# Patient Record
Sex: Female | Born: 1995 | Race: White | Hispanic: No | Marital: Single | Smoking: Former smoker
Health system: Southern US, Community
[De-identification: ages and names within clinical notes are randomized; demographics above are authoritative.]

## PROBLEM LIST (undated history)

## (undated) ENCOUNTER — Inpatient Hospital Stay: Payer: Self-pay

## (undated) ENCOUNTER — Emergency Department: Discharge status: Absent without leave | Payer: Managed Care, Other (non HMO)

## (undated) ENCOUNTER — Ambulatory Visit: Admission: EM | Payer: Medicaid Other | Source: Home / Self Care

## (undated) ENCOUNTER — Inpatient Hospital Stay: Payer: Managed Care, Other (non HMO)

## (undated) DIAGNOSIS — N390 Urinary tract infection, site not specified: Secondary | ICD-10-CM

## (undated) DIAGNOSIS — F329 Major depressive disorder, single episode, unspecified: Secondary | ICD-10-CM

## (undated) DIAGNOSIS — N12 Tubulo-interstitial nephritis, not specified as acute or chronic: Secondary | ICD-10-CM

## (undated) DIAGNOSIS — A419 Sepsis, unspecified organism: Secondary | ICD-10-CM

## (undated) DIAGNOSIS — Z9289 Personal history of other medical treatment: Secondary | ICD-10-CM

## (undated) DIAGNOSIS — F32A Depression, unspecified: Secondary | ICD-10-CM

## (undated) DIAGNOSIS — N809 Endometriosis, unspecified: Secondary | ICD-10-CM

## (undated) DIAGNOSIS — D649 Anemia, unspecified: Secondary | ICD-10-CM

## (undated) HISTORY — PX: TONSILLECTOMY: SUR1361

## (undated) HISTORY — PX: ABDOMINAL SURGERY: SHX537

---
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
## 2014-01-25 ENCOUNTER — Inpatient Hospital Stay: Payer: Self-pay

## 2014-01-25 LAB — URINALYSIS, COMPLETE
BILIRUBIN, UR: NEGATIVE
Ketone: NEGATIVE
NITRITE: NEGATIVE
Ph: 7 (ref 4.5–8.0)
Protein: 30
RBC,UR: 7 /HPF (ref 0–5)
SPECIFIC GRAVITY: 1.01 (ref 1.003–1.030)
WBC UR: 521 /HPF (ref 0–5)

## 2014-01-26 LAB — CBC WITH DIFFERENTIAL/PLATELET
BASOS PCT: 0.2 %
Basophil #: 0 10*3/uL (ref 0.0–0.1)
EOS ABS: 0.1 10*3/uL (ref 0.0–0.7)
Eosinophil %: 0.4 %
HCT: 32.2 % — AB (ref 35.0–47.0)
HGB: 11 g/dL — AB (ref 12.0–16.0)
Lymphocyte #: 1.6 10*3/uL (ref 1.0–3.6)
Lymphocyte %: 8.7 %
MCH: 29.5 pg (ref 26.0–34.0)
MCHC: 34.3 g/dL (ref 32.0–36.0)
MCV: 86 fL (ref 80–100)
MONOS PCT: 6.6 %
Monocyte #: 1.2 x10 3/mm — ABNORMAL HIGH (ref 0.2–0.9)
NEUTROS PCT: 84.1 %
Neutrophil #: 15.9 10*3/uL — ABNORMAL HIGH (ref 1.4–6.5)
PLATELETS: 246 10*3/uL (ref 150–440)
RBC: 3.74 10*6/uL — AB (ref 3.80–5.20)
RDW: 13.3 % (ref 11.5–14.5)
WBC: 18.9 10*3/uL — ABNORMAL HIGH (ref 3.6–11.0)

## 2014-01-26 LAB — BASIC METABOLIC PANEL
Anion Gap: 8 (ref 7–16)
BUN: 4 mg/dL — ABNORMAL LOW (ref 9–21)
CALCIUM: 8.7 mg/dL — AB (ref 9.0–10.7)
CHLORIDE: 107 mmol/L (ref 97–107)
CO2: 21 mmol/L (ref 16–25)
Creatinine: 0.63 mg/dL (ref 0.60–1.30)
EGFR (African American): >60
EGFR (Non-African Amer.): >60
Glucose: 108 mg/dL — ABNORMAL HIGH (ref 65–99)
OSMOLALITY: 269 (ref 275–301)
Potassium: 3.2 mmol/L — ABNORMAL LOW (ref 3.3–4.7)
Sodium: 136 mmol/L (ref 132–141)

## 2014-01-26 LAB — MAGNESIUM: Magnesium: 1.7 mg/dL — ABNORMAL LOW

## 2014-01-27 LAB — CBC WITH DIFFERENTIAL/PLATELET
Basophil #: 0 10*3/uL (ref 0.0–0.1)
Basophil %: 0.3 %
EOS PCT: 0.8 %
Eosinophil #: 0.1 10*3/uL (ref 0.0–0.7)
HCT: 30.4 % — AB (ref 35.0–47.0)
HGB: 10.2 g/dL — AB (ref 12.0–16.0)
LYMPHS PCT: 8.6 %
Lymphocyte #: 1.2 10*3/uL (ref 1.0–3.6)
MCH: 29.1 pg (ref 26.0–34.0)
MCHC: 33.4 g/dL (ref 32.0–36.0)
MCV: 87 fL (ref 80–100)
Monocyte #: 0.9 x10 3/mm (ref 0.2–0.9)
Monocyte %: 6.7 %
NEUTROS ABS: 11.4 10*3/uL — AB (ref 1.4–6.5)
Neutrophil %: 83.6 %
PLATELETS: 201 10*3/uL (ref 150–440)
RBC: 3.49 10*6/uL — AB (ref 3.80–5.20)
RDW: 13.3 % (ref 11.5–14.5)
WBC: 13.6 10*3/uL — ABNORMAL HIGH (ref 3.6–11.0)

## 2014-01-27 LAB — BASIC METABOLIC PANEL
Anion Gap: 9 (ref 7–16)
BUN: 3 mg/dL — ABNORMAL LOW (ref 9–21)
CHLORIDE: 104 mmol/L (ref 97–107)
Calcium, Total: 8.4 mg/dL — ABNORMAL LOW (ref 9.0–10.7)
Co2: 21 mmol/L (ref 16–25)
Creatinine: 0.61 mg/dL (ref 0.60–1.30)
EGFR (Non-African Amer.): >60
Glucose: 93 mg/dL (ref 65–99)
Osmolality: 264 (ref 275–301)
POTASSIUM: 2.9 mmol/L — AB (ref 3.3–4.7)
Sodium: 134 mmol/L (ref 132–141)

## 2014-01-27 LAB — URINE CULTURE

## 2014-01-27 LAB — MAGNESIUM: Magnesium: 1.5 mg/dL — ABNORMAL LOW

## 2014-01-28 LAB — BASIC METABOLIC PANEL
ANION GAP: 8 (ref 7–16)
BUN: 2 mg/dL — ABNORMAL LOW (ref 9–21)
CO2: 22 mmol/L (ref 16–25)
Calcium, Total: 8.7 mg/dL — ABNORMAL LOW (ref 9.0–10.7)
Chloride: 107 mmol/L (ref 97–107)
Creatinine: 0.57 mg/dL — ABNORMAL LOW (ref 0.60–1.30)
EGFR (African American): >60
EGFR (Non-African Amer.): >60
GLUCOSE: 84 mg/dL (ref 65–99)
Osmolality: 269 (ref 275–301)
Potassium: 3.4 mmol/L (ref 3.3–4.7)
Sodium: 137 mmol/L (ref 132–141)

## 2014-01-28 LAB — MAGNESIUM: Magnesium: 1.6 mg/dL — ABNORMAL LOW

## 2014-02-20 ENCOUNTER — Ambulatory Visit: Payer: Self-pay

## 2014-03-03 ENCOUNTER — Observation Stay: Payer: Self-pay | Admitting: Obstetrics and Gynecology

## 2014-03-11 ENCOUNTER — Observation Stay: Payer: Self-pay

## 2014-03-13 ENCOUNTER — Inpatient Hospital Stay: Payer: Self-pay | Admitting: Obstetrics and Gynecology

## 2014-03-13 LAB — CBC WITH DIFFERENTIAL/PLATELET
Basophil #: 0 10*3/uL (ref 0.0–0.1)
Basophil %: 0.2 %
EOS ABS: 0.1 10*3/uL (ref 0.0–0.7)
EOS PCT: 0.4 %
HCT: 33.3 % — ABNORMAL LOW (ref 35.0–47.0)
HGB: 11.2 g/dL — ABNORMAL LOW (ref 12.0–16.0)
LYMPHS ABS: 2.3 10*3/uL (ref 1.0–3.6)
Lymphocyte %: 13.3 %
MCH: 28.1 pg (ref 26.0–34.0)
MCHC: 33.5 g/dL (ref 32.0–36.0)
MCV: 84 fL (ref 80–100)
MONOS PCT: 5 %
Monocyte #: 0.9 x10 3/mm (ref 0.2–0.9)
NEUTROS PCT: 81.1 %
Neutrophil #: 13.8 10*3/uL — ABNORMAL HIGH (ref 1.4–6.5)
PLATELETS: 387 10*3/uL (ref 150–440)
RBC: 3.96 10*6/uL (ref 3.80–5.20)
RDW: 14.5 % (ref 11.5–14.5)
WBC: 17 10*3/uL — ABNORMAL HIGH (ref 3.6–11.0)

## 2014-03-13 LAB — GC/CHLAMYDIA PROBE AMP

## 2014-03-15 LAB — CBC
HCT: 27.1 % — ABNORMAL LOW (ref 35.0–47.0)
HGB: 9.2 g/dL — ABNORMAL LOW (ref 12.0–16.0)
MCH: 28.6 pg (ref 26.0–34.0)
MCHC: 33.8 g/dL (ref 32.0–36.0)
MCV: 85 fL (ref 80–100)
Platelet: 290 10*3/uL (ref 150–440)
RBC: 3.21 10*6/uL — AB (ref 3.80–5.20)
RDW: 14.3 % (ref 11.5–14.5)
WBC: 15.5 10*3/uL — AB (ref 3.6–11.0)

## 2014-03-16 LAB — HEMATOCRIT: HCT: 25.5 % — AB (ref 35.0–47.0)

## 2014-10-28 ENCOUNTER — Emergency Department: Payer: Self-pay | Admitting: Emergency Medicine

## 2015-01-26 IMAGING — US US RENAL KIDNEY
1 series · 14 of 25 positions shown · non-contrast
Comparison: 10/10/2013

CLINICAL DATA: Fever and abdominal pain. Pyelonephritis. The
patient is 33 weeks pregnant.

EXAM:
RENAL/URINARY TRACT ULTRASOUND COMPLETE

[Series 1: us renal kidney · 0.30mm/px · 14 of 55 slices shown]
[im 1/55]
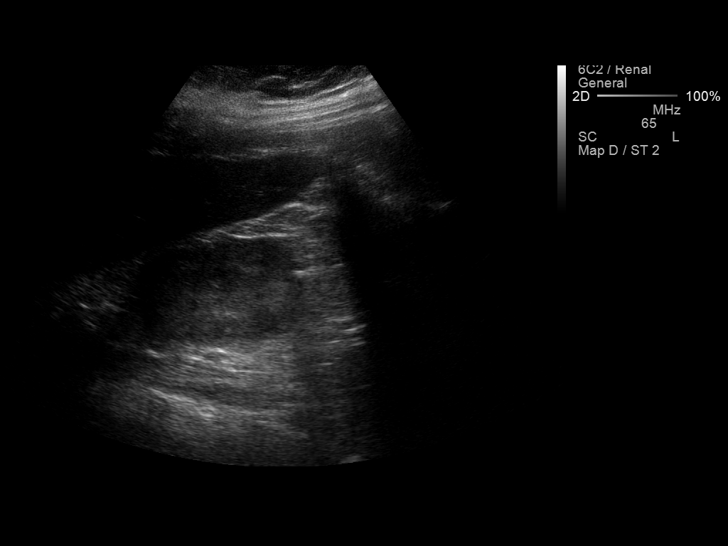
[im 5/55]
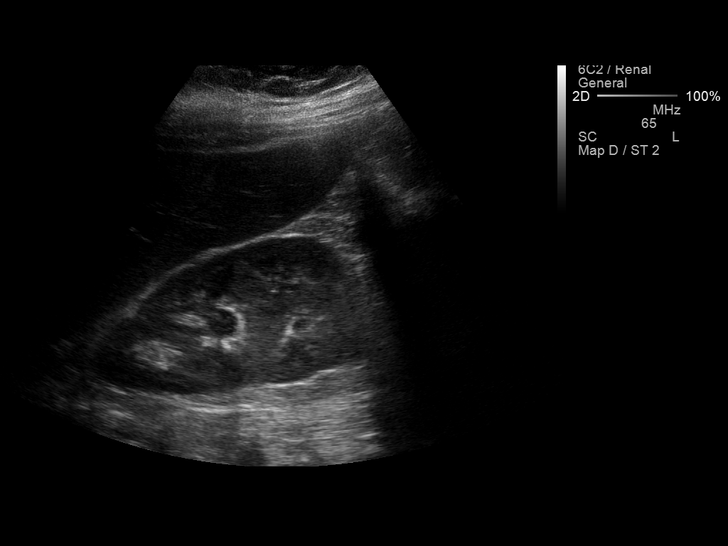
[im 10/55]
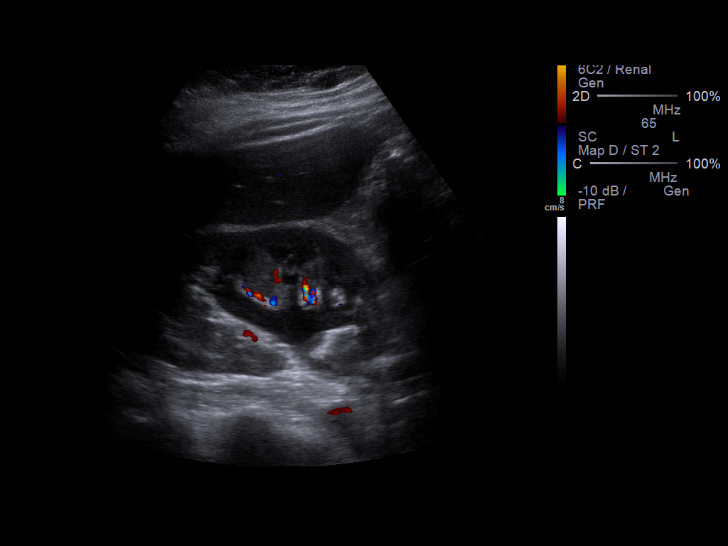
[im 14/55]
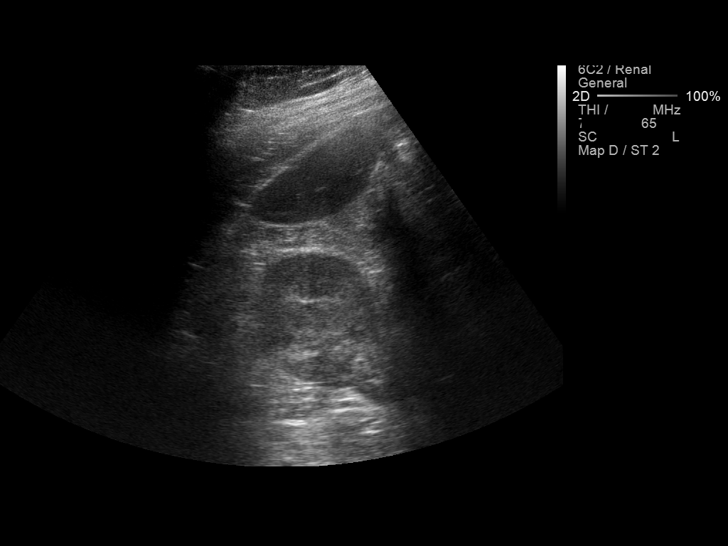
[im 19/55]
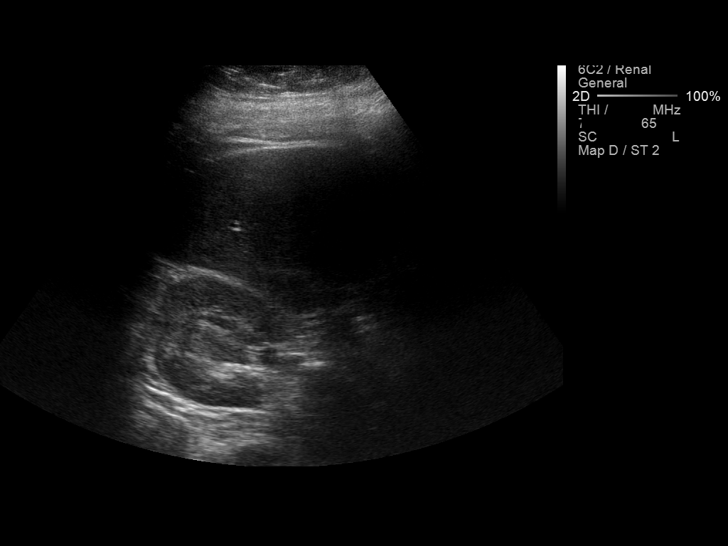
[im 21/55]
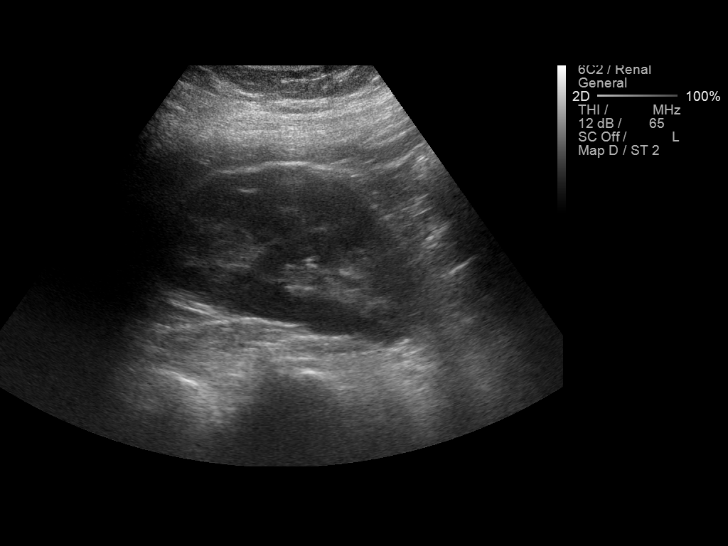
[im 25/55]
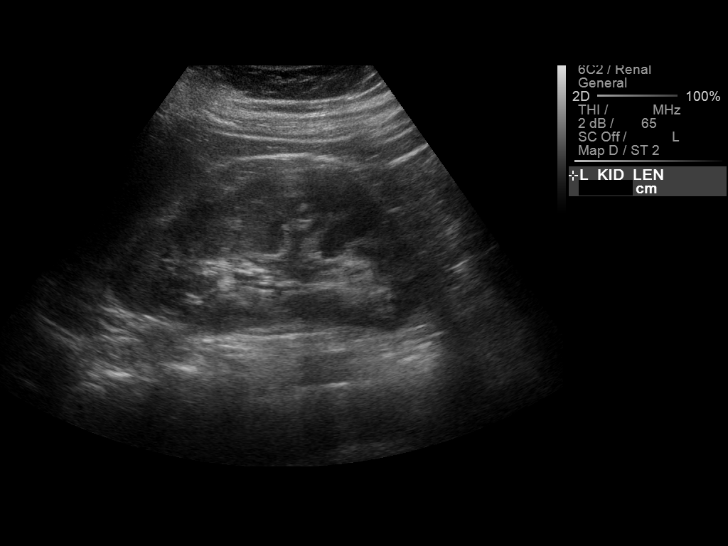
[im 30/55]
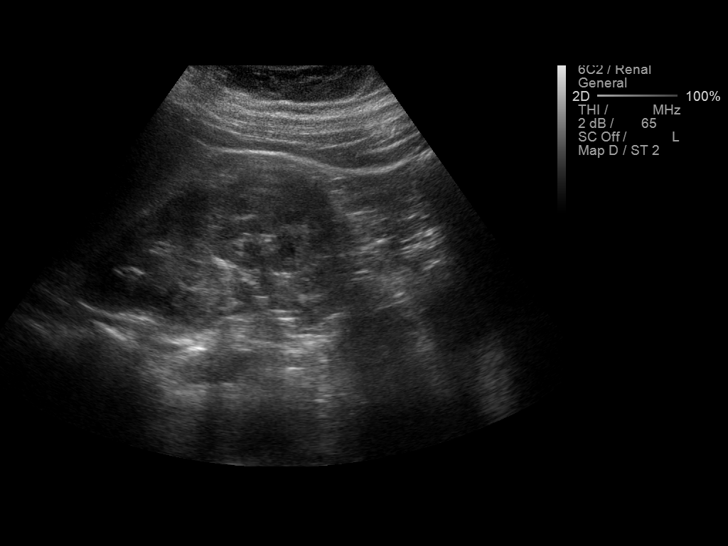
[im 34/55]
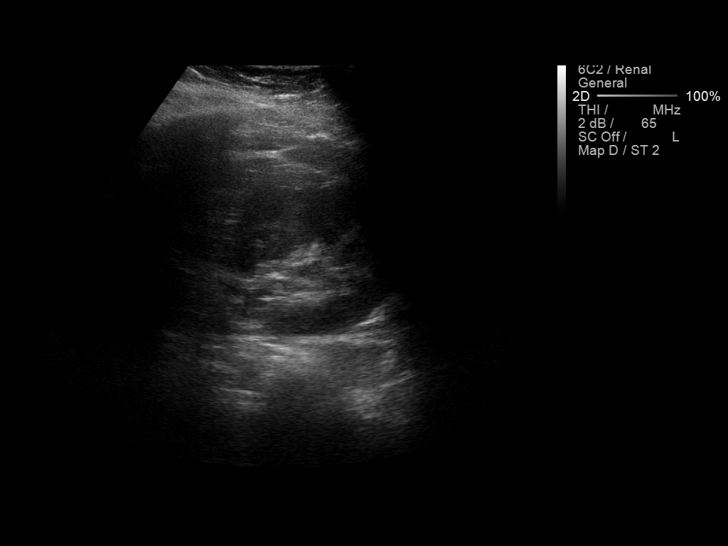
[im 37/55]
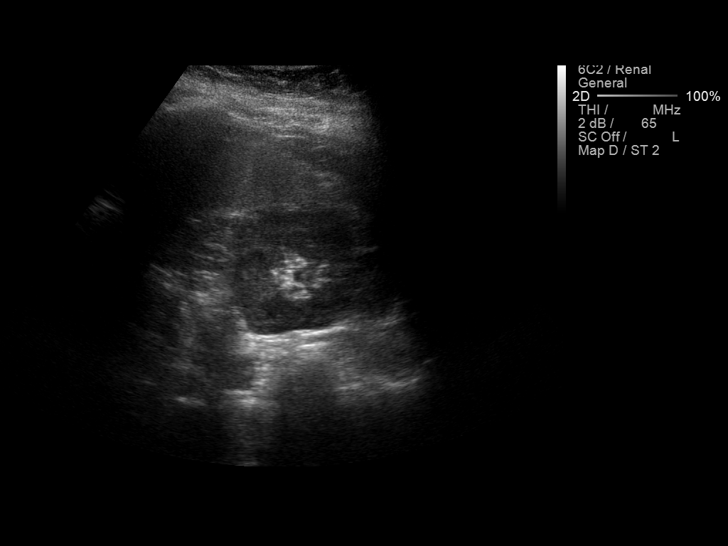
[im 41/55]
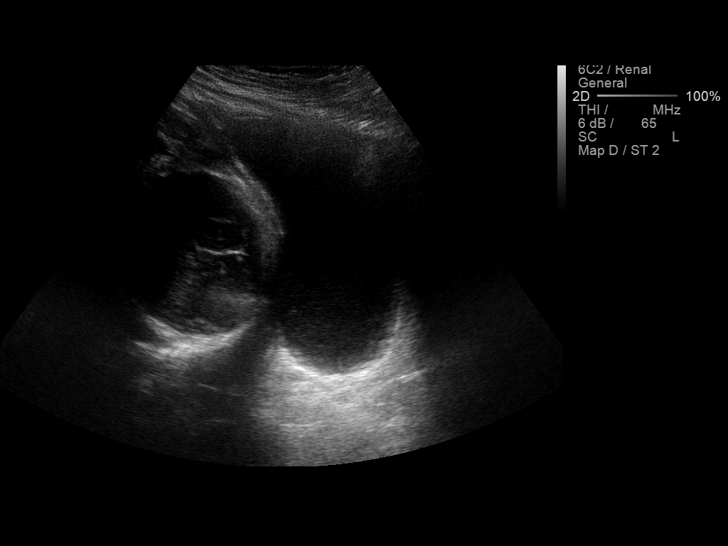
[im 46/55]
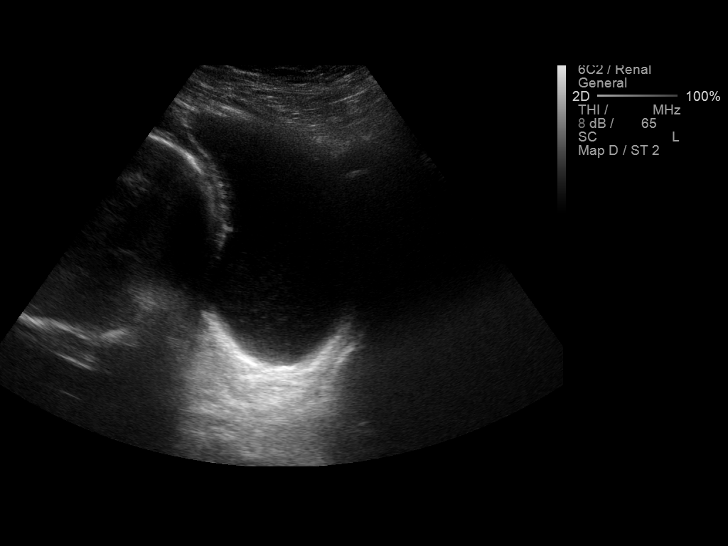
[im 50/55]
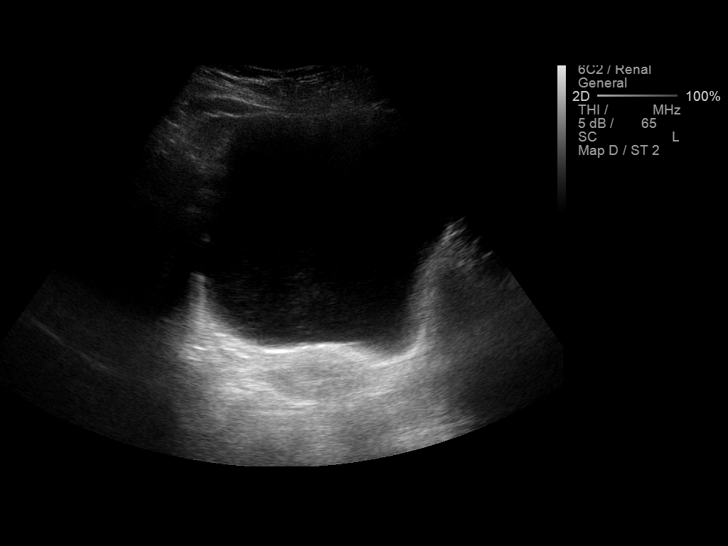
[im 55/55]
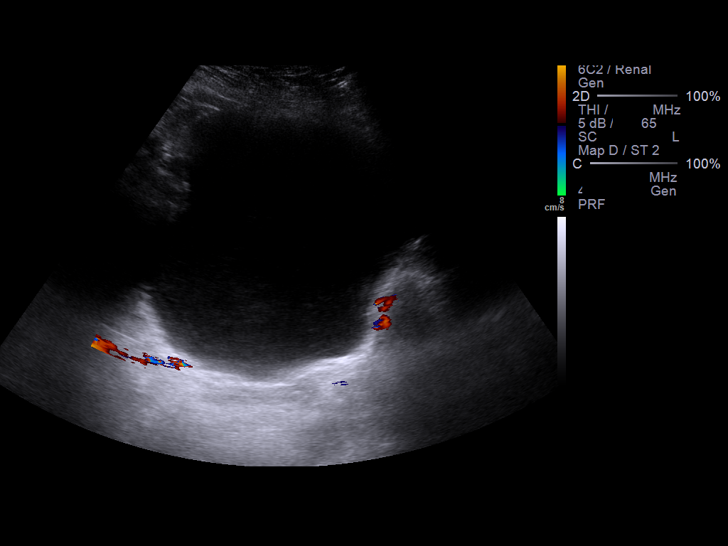

[14 of 25 positions shown; findings below may reference images not displayed]

FINDINGS: Right Kidney:

Length: 13.6 cm. Echogenicity is normal. No focal mass identified.
Moderate hydronephrosis is present.

Left Kidney:

Length: 11.6 cm. Echogenicity is normal. No focal mass identified.
Trace dilatation of the collecting system.

Bladder:

Note is made of mobile debris within the bladder.
IMPRESSION: 1. Moderate right hydronephrosis may reflect hydronephrosis of
pregnancy.
2. Trace left hydronephrosis.
3. No evidence for mass or intrarenal calculi.
4. Mobile debris within the bladder, a nonspecific finding.

## 2015-02-19 NOTE — Op Note (Signed)
PATIENT NAME:  Melissa Coffey, Melissa Coffey MR#:  [id number] DATE OF BIRTH:  1996/10/15  DATE OF PROCEDURE:  02/18/2013  PREOPERATIVE DIAGNOSIS:  Chronic pelvic pain.   POSTOPERATIVE DIAGNOSES:  1.  Chronic pelvic pain.  2.  Endometriosis.   PROCEDURE:  Diagnostic laparoscopy.   ANESTHESIA:  General.  SURGEON: Prentice Docker, MD  ESTIMATED BLOOD LOSS:  Minimal.   OPERATIVE FLUIDS: 1,100 mL crystalloid.   COMPLICATIONS:  None.   FINDINGS: 1.  Endometriotic implants in right ovarian fossa and left cul-de-sac sidewall.  2.  Otherwise normal-appearing uterus, fallopian tubes and ovaries.   SPECIMENS: None.   CONDITION AT END OF PROCEDURE: Stable.   INDICATION FOR PROCEDURE:  Melissa Coffey is a 19 year old G94 female who presented to the office with long-running chronic pelvic pain associated with her menses that was getting worse. She has tried multipe over-the-counter treatment modalities to help with the pain, such as ibuprofen, and was getting no benefit. We discussed medical options for treatment, though she wanted a diagnosis. She was offered multiple methods of presumptive treatment versus diagnosing by medication versus surgery. She elected to undergo diagnostic laparoscopy for diagnosis.   PROCEDURE IN DETAIL: The patient was met in the preoperative area and her questions were answered. She was taken to the Operating Room, where she was placed under general anesthesia, which was found to be adequate. She was placed in the dorsal supine position, with care taken to position her such that she would not have damage to nerves due to positioning. She was prepped and draped in the usual sterile fashion. After a timeout was called, I and O catheterization was performed to empty her bladder. A sterile speculum was placed in the vagina, and a Hulka tenaculum was placed on the cervix.    Attention was turned to the abdomen, where a 5 mm infraumbilical incision was made, and entry into the abdomen was obtained  using direct entry technique using an Optiview trocar. After the abdomen was entered, verification of entry was accomplished using opening pressures. Once verified, the abdomen was then insufflated with CO2. Next, the camera was introduced through the scope port and atraumatic entry was verified. The pelvis was then examined with the above-noted findings. A left lower quadrant port, 5 mm in size, was created without difficulty to aid in visualization. No obvious areas lend themselves to biopsy. However, given the implants seen,  even if a negative biopsy had returned, a diagnosis of endometriosis would have been still given. Therefore, at this point the procedure was terminated. The trocars were then removed after the abdomen was desufflated of CO2. The skin was reapproximated using 3-0 Vicryl and Dermabond. A total of 17 mL of 0.5% Marcaine with epinephrine was injected into the 2 incision sites. The Hulka tenaculum was removed from the cervix, and hemostasis was verified at the cervix. Then the vagina was verified to be clear of any instrumentation or sponges.   The patient tolerated the procedure well. Sponge, lap and needle counts were correct x 2. She was awakened in the Operating Room and taken to the recovery area in stable condition.    ____________________________ Will Bonnet, MD sdj:mr D: 02/18/2013 19:46:00 ET T: 02/18/2013 19:59:03 ET JOB#: [id number]  cc: Will Bonnet, MD, <Dictator> Will Bonnet MD ELECTRONICALLY SIGNED 02/27/2013 9:04

## 2015-02-20 NOTE — Op Note (Signed)
PATIENT NAME:  Melissa Coffey, Melissa Coffey MR#:  [id number] DATE OF BIRTH:  1995-12-19  DATE OF PROCEDURE:  03/15/2014  PREOPERATIVE DIAGNOSES: 1.  Intrauterine pregnancy at 41 weeks, 1 day gestational age.  2.  Fetal intolerance of labor.   POSTOPERATIVE DIAGNOSES: 1.  Intrauterine pregnancy at 41 weeks, 1 day gestational age.  2.  Fetal intolerance of labor.  PROCEDURE:  Primary low transverse cesarean section via Pfannenstiel incision.   ANESTHESIA:  Epidural.   SURGEON:  Thomasene Mohair, M.D.   ASSISTANT:  Elizebeth Brooking, M.D.   ESTIMATED BLOOD LOSS:  1000 mL.  OPERATIVE FLUIDS:  1000 mL crystalloid.   COMPLICATIONS:  None.   FINDINGS: 1.  Viable female infant.  2.  Normal-appearing gravid uterus, fallopian tubes and ovaries.   SPECIMENS:  None.   CONDITION AT THE END OF THE PROCEDURE:  Stable.   PROCEDURE IN DETAIL:  The patient was taken to the Operating Room where epidural anesthesia was administered and found to be adequate.  She was placed in the dorsal supine position with a leftward tilt and prepped and draped in the usual sterile fashion.  After a timeout was called, a Pfannenstiel incision was made with a scalpel and carried through the various layers until the peritoneum was identified and entered sharply.  The peritoneal incision was extended and a bladder flap was created and a bladder retractor was placed to pull the bladder out of the operative area of interest.  A low transverse hysterotomy was made with a scalpel and extended laterally with cranial and caudal tension.  The fetal vertex was grasped and elevated to the hysterotomy and was delivered using fundal pressure followed by the shoulders and the rest of the body.  The cord was clamped and cut and the fetus was handed to the pediatrician.  Cord blood was collected.  The hysterotomy was closed using #0 Vicryl in a running locked fashion.  A second layer of the same suture was used to obtain hemostasis.  The uterus was returned  to the abdomen and the abdomen was cleared of all clots and debris.  The peritoneum was reapproximated with #0 Vicryl in a running fashion.   The On-Q catheters were placed according to the manufacturer's recommendations.  They were placed approximately 4 cm cephalad to the incision line, approximately 1 cm apart, straddling the midline.  They were inserted to a depth of approximately the fourth marking on the catheters positioned just superficial to the rectus abdominis muscles and just deep to the rectus fascia.   The fascia was closed using #0 Maxon using two sutures starting at the lateral apices and sewing to the midline where they were tied together.  The subcutaneous tissue near the dermis was reapproximated for incision support.  The skin was closed using the Insorb subcuticular stapler.  This incision was reinforced with benzoin and Steri-Strips.   Dermabond was used to affix the On-Q catheters to the skin.  Each catheter was bolused with 5 mL of 0.5% Marcaine plain for a total of 10 mL.  The catheters were further affixed to the skin using Steri-Strips as well as Tegaderm.   The patient tolerated the procedure well.  Sponge, lap, and needle counts were correct x 2.  For antibiotic prophylaxis, the patient was given Ancef 2 grams prior to skin incision.  She was taken to the recovery area in stable condition at the end of the procedure.     ____________________________ Conard Novak, MD sdj:ea D: 03/15/2014 01:22:43  ET T: 03/15/2014 02:21:26 ET JOB#: [id number]  cc: Conard NovakStephen D. Rubens Cranston, MD, <Dictator> Conard NovakSTEPHEN D Darthula Desa MD ELECTRONICALLY SIGNED 04/21/2014 21:46
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
## 2015-03-09 NOTE — H&P (Signed)
L&D Evaluation:  History Expanded:  HPI 19 yo G1  with EDC=03/06/2014 by a 8 week ultrasound presented at 30 4/7 weeks with diffuse abdominal pian. The pain started this evening at noon today, unprovoked. She has had the pain at different placed all over her abdomen. The pain is described as sharp, does not radiate, it lasts for 10-15 seconds.  It is relieved by position changes.  She has had no abdominal trauma.  She has not eaten much today, but has been able to drink water.  She is not nauseated, no emesis.  No diarrhea or fevers.  She denies heartburn.  She currently is on macrobid suppression after hospitalization 12 December for left pyelo due to E.coli that was pansensitive. There were no stones seen on ultrasound.  She had two UTIs prior to her pyelo hospitalization. Reports active FM. She notes no vaginal bleeding.  [facility]   Presents with abdominal pain, dysuria   Patient's Medical History chronic pelvic pain, UTIs, pyelo 09/2013   Patient's Surgical History 1) Tonsillectomy/adenoidectomy, 2) diagnostic laparoscopy   Medications Macrobid qhs. Not taking PNV   Allergies NKDA   Social History none   Family History Non-Contributory   ROS:  ROS All systems were reviewed.  HEENT, CNS, GI, GU, Respiratory, CV, Renal and Musculoskeletal systems were found to be normal., unless noted in HPI   Exam:  Vital Signs T99.0, P110, RR 20, 125/72   Urine Protein negative dipstick   General no apparent distress   Mental Status clear   Chest clear   Heart normal sinus rhythm, no murmur/gallop/rubs   Abdomen gravid, non tender   Fetal Position transverse   Back no CVAT   Edema no edema   Mebranes Intact   FHT normal rate with no decels, 130/mod var/+accels/no decels   Ucx absent   Impression:  Impression IUP at 5156w4d gestational age with musculoskeletal pain   Plan:  Plan EFM/NST   Comments Reassured patient that no concerning symptoms or findings were noted.   Discussed MSK pain in pregnancy and some strategies to cope.  Gave precautions should the characteristics of her abdominal pain change.  Reactive NST.   Labs:  Lab Results: Routine UA:  03-Mar-15 20:20   Color (UA) Yellow  Clarity (UA) Hazy  Glucose (UA) Negative  Bilirubin (UA) Negative  Ketones (UA) Negative  Specific Gravity (UA) 1.013  Blood (UA) Negative  pH (UA) 7.0  Protein (UA) Negative  Nitrite (UA) Negative  Leukocyte Esterase (UA) Trace (Result(s) reported on 30 Dec 2013 at 09:18PM.)  RBC (UA) 2 /HPF  WBC (UA) 10 /HPF  Bacteria (UA) NONE SEEN  Epithelial Cells (UA) 5 /HPF  Mucous (UA) PRESENT (Result(s) reported on 30 Dec 2013 at 09:18PM.)   Electronic Signatures: Conard NovakJackson, Taner Rzepka D (MD)  (Signed 03-Mar-15 22:19)  Authored: L&D Evaluation, Labs   Last Updated: 03-Mar-15 22:19 by Conard NovakJackson, Delroy Ordway D (MD)

## 2015-03-09 NOTE — H&P (Signed)
L&D Evaluation:  History:  HPI 19 yo G1  with EDC=03/06/2014 by a 8 week ultrasound presented at 34 2/7 weeks who presents with reports of right back and flank pain, nausea, vomiting, and fever that started yesterday. She reports a temperature at home of 101.5. She states that she has not been able to eat much or drink much today and w hat she has eaten has been thrown up. She denies dysuria, urgency, or frequency with voiding. She currently is on macrobid suppression after hospitalization 12 December for left pyelo due to E.coli that was pansensitive. There were no stones seen on ultrasound.  She had two UTIs prior to her pyelo hospitalization. Reports active FM. She notes no vaginal bleeding.  [facility]   Presents with abdominal pain, dysuria   Patient's Medical History chronic pelvic pain, UTIs, pyelo 09/2013   Patient's Surgical History 1) Tonsillectomy/adenoidectomy, 2) diagnostic laparoscopy   Medications Macrobid qhs. Not taking PNV   Allergies NKDA   Social History none   Family History Non-Contributory   ROS:  ROS All systems were reviewed.  HEENT, CNS, GI, GU, Respiratory, CV, Renal and Musculoskeletal systems were found to be normal., unless noted in HPI   Exam:  Vital Signs T99.8, P120's before tylenol   Urine Protein 1+   General no apparent distress   Mental Status clear   Chest clear   Heart normal sinus rhythm, no murmur/gallop/rubs   Abdomen gravid, non tender   Back CVAT, right side more tender than left   Edema no edema   Mebranes Intact   FHT normal rate with no decels, 120's/mod var/+accels/no decels   Ucx absent   Skin dry, no lesions, no rashes   Lymph no lymphadenopathy   Impression:  Impression UTI, reactive NST, IUP at 5324w2d gestational age, pyelonephritis   Plan:  Plan ancef 1 gram every 8 hours, tylenol for pain/fever, zofran/phenergan for nausea, LR @ 150, BMP with mag, and CBC this am, diet as tolerated, kidney u/s, k pad for  back pain   Comments plan discussed with Dr. Janene HarveyKlett.   Follow Up Appointment need to schedule   Electronic Signatures: Jannet MantisSubudhi, Mustaf Antonacci (CNM)  (Signed 30-Mar-15 00:09)  Authored: L&D Evaluation   Last Updated: 30-Mar-15 00:09 by Jannet MantisSubudhi, Jacolyn Joaquin (CNM)

## 2015-03-09 NOTE — H&P (Signed)
L&D Evaluation:  History:  HPI 19 yo G1  with EDC=03/06/2014 by a 8 week ultrasound presented originally at 25 2/7 weeks with sharp right sided abdominal pain. The pain started this evening at 9PM while she was on the couch at home. The pain has now subsided. She has had this pain intermittently which is worsened by movement. She also reports dysuria x 1 weeks, suprapubic tenderness and right thoracic back pain. She currently is on macrobid suppression after hospitalization 12 December for left pyelo due to E.coli that was pansensitive. There were no stones seen on ultrasound.  She had two UTIs prior to her pyelo hospitalization. Reports active FM. She notes no vaginal bleeding but has had a white vaginal discharge. Denies diarrhea, constipation, N/V.  [facility]   Presents with abdominal pain, dysuria   Patient's Medical History chronic pelvic pain, UTIs, pyelo 09/2013   Patient's Surgical History 1) Tonsillectomy/adenoidectomy, 2) diagnostic laparoscopy   Medications Macrobid qhs. Not taking PNV   Allergies NKDA   Social History none   Family History Non-Contributory   Exam:  Vital Signs 110/64   Urine Protein negative dipstick, UA with 2+ leuks, 1.016, 8RBC, 21 WBC, trace bacteria, 8 epi cells, calcium oxalate crystals   General no apparent distress, appears pale   Mental Status clear   Chest clear   Heart normal sinus rhythm, no murmur/gallop/rubs   Abdomen gravid, local tenderness over right round ligament.  +suprapubic tenderness   Fetal Position cephalic   Back right CVAT   Edema no edema   Pelvic cervix closed and thick, mildly inflammed introitus. white homogenous discharge. wet prep positive for clue cells   Mebranes Intact   FHT normal rate with no decels, 125 baseline-age appropriate tracing   Ucx absent   Skin dry   Impression:  Impression IUP at 25 2/7 weeks with possible UTI. BV. R/O urinary calculi. Right round ligament pain.   Plan:  Plan urine  culture pending. DC home on Keflex 500 mgm tid. RX for Flagyl 500 mgm BID x 7 days-to start after completing Keflex. Diflucan RX for monilial prevention. RX for [facility]. Follow up in office next week.. increase water intake.   Electronic Signatures: Trinna BalloonGutierrez, Janequa Kipnis L (CNM)  (Signed 25-Jan-15 01:21)  Authored: L&D Evaluation   Last Updated: 25-Jan-15 01:21 by Trinna BalloonGutierrez, Breonia Kirstein L (CNM)

## 2015-10-31 DIAGNOSIS — Z9289 Personal history of other medical treatment: Secondary | ICD-10-CM

## 2015-10-31 HISTORY — DX: Personal history of other medical treatment: Z92.89

## 2015-11-11 ENCOUNTER — Observation Stay
Admission: EM | Admit: 2015-11-11 | Discharge: 2015-11-12 | Disposition: A | Discharge status: Home / Self Care | Payer: Managed Care, Other (non HMO) | Attending: Obstetrics and Gynecology | Admitting: Obstetrics and Gynecology

## 2015-11-11 DIAGNOSIS — R0602 Shortness of breath: Secondary | ICD-10-CM | POA: Diagnosis not present

## 2015-11-11 DIAGNOSIS — O210 Mild hyperemesis gravidarum: Secondary | ICD-10-CM | POA: Diagnosis not present

## 2015-11-11 DIAGNOSIS — R42 Dizziness and giddiness: Secondary | ICD-10-CM | POA: Insufficient documentation

## 2015-11-11 DIAGNOSIS — R000 Tachycardia, unspecified: Secondary | ICD-10-CM | POA: Insufficient documentation

## 2015-11-11 DIAGNOSIS — O26891 Other specified pregnancy related conditions, first trimester: Secondary | ICD-10-CM | POA: Insufficient documentation

## 2015-11-11 DIAGNOSIS — Z3A10 10 weeks gestation of pregnancy: Secondary | ICD-10-CM | POA: Insufficient documentation

## 2015-11-11 DIAGNOSIS — O99019 Anemia complicating pregnancy, unspecified trimester: Secondary | ICD-10-CM

## 2015-11-11 DIAGNOSIS — O99011 Anemia complicating pregnancy, first trimester: Principal | ICD-10-CM | POA: Insufficient documentation

## 2015-11-11 HISTORY — DX: Anemia, unspecified: D64.9

## 2015-11-11 HISTORY — DX: Endometriosis, unspecified: N80.9

## 2015-11-11 LAB — BASIC METABOLIC PANEL
Anion gap: 7 (ref 5–15)
BUN: 15 mg/dL (ref 6–20)
CHLORIDE: 107 mmol/L (ref 101–111)
CO2: 21 mmol/L — AB (ref 22–32)
CREATININE: 0.41 mg/dL — AB (ref 0.44–1.00)
Calcium: 9.1 mg/dL (ref 8.9–10.3)
GFR calc Af Amer: >60 mL/min (ref >60)
GFR calc non Af Amer: >60 mL/min (ref >60)
GLUCOSE: 89 mg/dL (ref 65–99)
POTASSIUM: 3.5 mmol/L (ref 3.5–5.1)
Sodium: 135 mmol/L (ref 135–145)

## 2015-11-11 LAB — CBC
HEMATOCRIT: 19.6 % — AB (ref 35.0–47.0)
Hemoglobin: 5.7 g/dL — ABNORMAL LOW (ref 12.0–16.0)
MCH: 17.1 pg — AB (ref 26.0–34.0)
MCHC: 29.2 g/dL — ABNORMAL LOW (ref 32.0–36.0)
MCV: 58.4 fL — AB (ref 80.0–100.0)
PLATELETS: 330 10*3/uL (ref 150–440)
RBC: 3.36 MIL/uL — ABNORMAL LOW (ref 3.80–5.20)
RDW: 19.4 % — AB (ref 11.5–14.5)
WBC: 9.2 10*3/uL (ref 3.6–11.0)

## 2015-11-11 LAB — ABO/RH: ABO/RH(D): O POS

## 2015-11-11 LAB — PREPARE RBC (CROSSMATCH)

## 2015-11-11 MED ORDER — PROMETHAZINE HCL 25 MG PO TABS: 12.5000 mg | ORAL_TABLET | Freq: Four times a day (QID) | ORAL | Status: DC | PRN

## 2015-11-11 MED ORDER — PRENATAL MULTIVITAMIN CH: 1.0000 | ORAL_TABLET | Freq: Every day | ORAL | Status: DC

## 2015-11-11 MED ORDER — SODIUM CHLORIDE 0.9 % IV SOLN
10.0000 mL/h | Freq: Once | INTRAVENOUS | Status: AC
  Administered 2015-11-11: 10 mL/h via INTRAVENOUS

## 2015-11-11 MED ORDER — DOCUSATE SODIUM 100 MG PO CAPS
100.0000 mg | ORAL_CAPSULE | Freq: Two times a day (BID) | ORAL | Status: DC | PRN
Start: 2015-11-11 — End: 2015-11-12

## 2015-11-11 MED ORDER — DIPHENHYDRAMINE HCL 25 MG PO CAPS: 25.0000 mg | ORAL_CAPSULE | Freq: Four times a day (QID) | ORAL | Status: DC | PRN

## 2015-11-11 MED ORDER — FERROUS GLUCONATE 324 (38 FE) MG PO TABS
324.0000 mg | ORAL_TABLET | Freq: Two times a day (BID) | ORAL | Status: DC
  Administered 2015-11-12: 324 mg via ORAL
  Filled 2015-11-11 (×4): qty 1

## 2015-11-11 MED ORDER — CALCIUM CARBONATE ANTACID 500 MG PO CHEW: 2.0000 | CHEWABLE_TABLET | ORAL | Status: DC | PRN

## 2015-11-11 MED ORDER — ACETAMINOPHEN 325 MG PO TABS
650.0000 mg | ORAL_TABLET | ORAL | Status: DC | PRN
  Administered 2015-11-11: 650 mg via ORAL
  Filled 2015-11-11: qty 2

## 2015-11-11 MED ORDER — LACTATED RINGERS IV SOLN
INTRAVENOUS | Status: DC
  Administered 2015-11-11: 23:00:00 via INTRAVENOUS

## 2015-11-11 NOTE — ED Provider Notes (Signed)
[facility] Emergency Department Provider Note  ____________________________________________  Time seen: Approximately 135 PM  I have reviewed the triage vital signs and the nursing notes.   HISTORY  Chief Complaint Abnormal Lab    HPI Melissa RutherfordHarley R Coffey is a 20 y.o. female who is a G2 P1 who is presenting today anemic from her OB/GYN's office. [facility] is a patient [facility]. She says she is a baseline history of anemia but is never needed a blood transfusion. She denies any vaginal bleeding or bleeding in her stool. She said that she had a fetal heart rate today at 169 done at her OB/GYN office. She was found to have a hemoglobin of 5 also is now patient. She says that she has been short of breathincreasingly with some intermittent chest burning in the mornings but no complaints of chest pain that is worsening with exertion. She says that shortness of breath does worsen with exertion.   Past Medical History  Diagnosis Date  . Anemia   . Endometriosis     There are no active problems to display for this patient.   History reviewed. No pertinent past surgical history.  No current outpatient prescriptions on file.  Allergies Review of patient's allergies indicates no known allergies.  No family history on file.  Social History Social History  Substance Use Topics  . Smoking status: Never Smoker   . Smokeless tobacco: None  . Alcohol Use: No    Review of Systems Constitutional: No fever/chills Eyes: No visual changes. ENT: No sore throat. Cardiovascular: As above  Respiratory: As above  Gastrointestinal: No abdominal pain.  No nausea, no vomiting.  No diarrhea.  No constipation. Genitourinary: Negative for dysuria. Musculoskeletal: Negative for back pain. Skin: Negative for rash. Neurological: Negative for headaches, focal weakness or numbness.  10-point ROS otherwise negative.  ____________________________________________   PHYSICAL  EXAM:  VITAL SIGNS: ED Triage Vitals  Enc Vitals Group     BP 11/11/15 1307 109/74 mmHg     Pulse Rate 11/11/15 1307 119     Resp 11/11/15 1307 20     Temp 11/11/15 1307 98.3 F (36.8 C)     Temp Source 11/11/15 1307 Oral     SpO2 11/11/15 1307 100 %     Weight 11/11/15 1307 161 lb (73.029 kg)     Height 11/11/15 1307 5\' 4"  (1.626 m)     Head Cir --      Peak Flow --      Pain Score --      Pain Loc --      Pain Edu? --      Excl. in GC? --     Constitutional: Alert and oriented. Well appearing and in no acute distress. Eyes: Pale conjunctiva. PERRL. EOMI. Head: Atraumatic. Nose: No congestion/rhinnorhea. Mouth/Throat: Mucous membranes are moist.  Oropharynx non-erythematous. Neck: No stridor.   Cardiovascular: Normal rate, regular rhythm. Grossly normal heart sounds.  Good peripheral circulation. Respiratory: Normal respiratory effort.  No retractions. Lungs CTAB. Gastrointestinal: Soft and nontender. No distention. No abdominal bruits. No CVA tenderness. Hemoccult negative. Musculoskeletal: No lower extremity tenderness nor edema.  No joint effusions. Neurologic:  Normal speech and language. No gross focal neurologic deficits are appreciated. No gait instability. Skin:  Skin is warm, dry and intact. No rash noted. Psychiatric: Mood and affect are normal. Speech and behavior are normal.  ____________________________________________   LABS (all labs ordered are listed, but only abnormal results are displayed)  Labs Reviewed  BASIC METABOLIC PANEL - Abnormal; Notable for the following:    CO2 21 (*)    Creatinine, Ser 0.41 (*)    All other components within normal limits  CBC - Abnormal; Notable for the following:    RBC 3.36 (*)    Hemoglobin 5.7 (*)    HCT 19.6 (*)    MCV 58.4 (*)    MCH 17.1 (*)    MCHC 29.2 (*)    RDW 19.4 (*)    All other components within normal limits  URINALYSIS COMPLETEWITH MICROSCOPIC ([facility] ONLY)  CBG MONITORING, ED  TYPE AND SCREEN   ABO/RH  PREPARE RBC (CROSSMATCH)   ____________________________________________  EKG  ED ECG REPORT I, Arelia Longest, the attending physician, personally viewed and interpreted this ECG.   Date: 11/11/2015  EKG Time: 1309  Rate: 122  Rhythm: sinus tachycardia  Axis: Normal axis  Intervals:none  ST&T Change: No ST segment elevation or depression. No abnormal T-wave inversion.  ____________________________________________  RADIOLOGY   ____________________________________________   PROCEDURES CRITICAL CARE Performed by: Arelia Longest   Total critical care time: 35 minutes  Critical care time was exclusive of separately billable procedures and treating other patients.  Critical care was necessary to treat or prevent imminent or life-threatening deterioration.  Critical care was time spent personally by me on the following activities: development of treatment plan with patient and/or surrogate as well as nursing, discussions with consultants, evaluation of patient's response to treatment, examination of patient, obtaining history from patient or surrogate, ordering and performing treatments and interventions, ordering and review of laboratory studies, ordering and review of radiographic studies, pulse oximetry and re-evaluation of patient's condition.   ____________________________________________   INITIAL IMPRESSION / ASSESSMENT AND PLAN / ED COURSE  Pertinent labs & imaging results that were available during my care of the patient were reviewed by me and considered in my medical decision making (see chart for details).  ----------------------------------------- 2:11 PM on 11/11/2015 -----------------------------------------  Patient's symptoms likely secondary to anemia. Ordered 2 units of blood. Signed out to Dr. Vergie Living of the OB/GYN service. Patient family aware of need for admission. ____________________________________________   FINAL CLINICAL  IMPRESSION(S) / ED DIAGNOSES  Symptomatic anemia likely secondary to pregnancy.    Myrna Blazer, MD 11/11/15 [phone]

## 2015-11-11 NOTE — ED Notes (Signed)
Blood administration rate changed to 22950ml/hr. Patient tolerating well.

## 2015-11-11 NOTE — Progress Notes (Signed)
Pt admitted from ED, profile completed at admission, 1 unit of blood hanging at admission started by ER that completed after admission, pt will receive 1 more unit. Shortly after admission pt had order to be transferred to thrid floor for maternity care. Denies pain, supportive family at bedside, [facility], Assessment not performed due to immediate transfer that was placed at time of admission.

## 2015-11-11 NOTE — ED Notes (Signed)
Pt very pale.

## 2015-11-11 NOTE — H&P (Signed)
Obstetrics Admission History & Physical  11/11/2015 - 7:16 PM Primary OBGYN: [facility]  Chief Complaint: symptomatic anemia in pregnancy  History of Present Illness  20 y.o. G2P1001 caucasian @ 10/5 (Dating: EDC 8/5, LMP=8wk u/s), with the above CC. Pregnancy complicated by: h/o pLTCS, h/o UTI.  Patient was diagnosed with new anemia at her NOB visit on 12/19 with H/H of 7.3/25.7; pt wasn't notified of this result and when seen on 12/28 for her dating u/s, and anemia panel and sickle cell testing was done, which was normal for b12, folic acid, as well as the result of her CBC except for H/H of 6.8/23.7 and iron studies which indicated iron deficiency anemia; her [place] testing was also normal. She was put on bid iron, in addition to her PNV.  She presented to clinic today and was now symptomatic (lethargy) and except for being tachy she had normal VS and normal FHR. She was sent to the ER and had stable VS and had confirmed anemia. She was ordered 2U PRBCs and then called for admission.   No chest pain, SOB, s/s of SAB/PTL. +lethargy  Review of Systems:  her 12 point review of systems is negative or as noted in the History of Present Illness.  PMHx:  Past Medical History  Diagnosis Date  . Anemia   . Endometriosis    PSHx:  Past Surgical History  Procedure Laterality Date  . Cesarean section  02/2014    pLTCS. FITL at term   Medications:  Prescriptions prior to admission  Medication Sig Dispense Refill Last Dose  . ferrous sulfate 325 (65 FE) MG tablet Take 325 mg by mouth daily with breakfast.   11/11/2015 at Unknown time  . Prenatal Vit-Fe Fumarate-FA (PRENATAL MULTIVITAMIN) TABS tablet Take 1 tablet by mouth daily at 12 noon.   11/11/2015 at Unknown time     Allergies: has No Known Allergies. OBHx:  OB History  Gravida Para Term Preterm AB SAB TAB Ectopic Multiple Living  2 1 1       1     # Outcome Date GA Lbr Len/2nd Weight Sex Delivery Anes PTL Lv  2 Current           1 Term              Obstetric Comments  02/2014: pLTCS for FITL   GYNHx:  History of abnormal pap smears: no History of STIs: No..             FHx: History reviewed. No pertinent family history. Soc Hx:  Social History   Social History  . Marital Status: Single    Spouse Name: N/A  . Number of Children: N/A  . Years of Education: N/A   Occupational History  . Not on file.   Social History Main Topics  . Smoking status: Never Smoker   . Smokeless tobacco: Not on file  . Alcohol Use: No  . Drug Use: Not on file  . Sexual Activity: Not on file   Other Topics Concern  . Not on file   Social History Narrative  . No narrative on file    Objective    Current Vital Signs 24h Vital Sign Ranges  T 98.4 F (36.9 C) Temp  Avg: 98.3 F (36.8 C)  Min: 98 F (36.7 C)  Max: 98.8 F (37.1 C)  BP 99/63 mmHg BP  Min: 97/61  Max: 113/58  HR (!) 107 Pulse  Avg: 97  Min: 91  Max: 119  RR 18 Resp  Avg: 20.6  Min: 13  Max: 26  SaO2 100 % Not Delivered SpO2  Avg: 99.9 %  Min: 99 %  Max: 100 %       24 Hour I/O Current Shift I/O  Time Ins Outs        General: Well nourished, well developed female in no acute distress. Pale Skin:  Warm and dry.  Cardiovascular: no c/c/e Respiratory:  Normal respiratory effort Abdomen: nttp Neuro/Psych:  Normal mood and affect.    Recent Labs Lab 11/11/15 1310  WBC 9.2  HGB 5.7*  HCT 19.6*  PLT 330   O POS   Recent Labs Lab 11/11/15 1310  NA 135  K 3.5  CL 107  CO2 21*  BUN 15  CREATININE 0.41*  GLUCOSE 89  CALCIUM 9.1   ER hemoccult: negative  Pending: peripheral smear  Radiology None  ECG: sinus tachycardia   Assessment & Plan   19 y.o. G2P1001 @ 10/5 with symptomatic anemia; pt currently stable *IUP: fetal status reassuring. PRN fetal dopplers. PNV qday *Heme: she has received one unit thus far without issue. negative work up thus far so seems to indicate regular iron deficiency anemia. MFM curb-sided and they agree, but  will add on peripheral smear, in order to check for low chance of hemolysis. Will recheck CBC in AM and if appropriate and patient no longer symptomatic, will d/c home and recheck CBC in one month. If appropriately improved, can continue with bid iron regimen. If not appropriately better, then can send to MFM and Heme for formal consults *h/o UTI: TOC in early February needed *FEN/GI: regular diet. Can SLIV once PRBCs are done.  *PPx: SCDs and OOB with help. Falls precautions *Dispo: pending resolution of anemia s/s  Cornelia Copa. MD [facility] Pager [phone]

## 2015-11-11 NOTE — ED Notes (Signed)
[facility] Pt hx of anemia. Pt hemoglobin of 5. Last week pt reports hemoglobin was 6.5. Pt approx [redacted] weeks pregnant. 2nd pregnancy-1 living child. Pt reports SOB.

## 2015-11-12 DIAGNOSIS — O99011 Anemia complicating pregnancy, first trimester: Secondary | ICD-10-CM | POA: Diagnosis not present

## 2015-11-12 LAB — URINALYSIS COMPLETE WITH MICROSCOPIC (ARMC ONLY)
Bilirubin Urine: NEGATIVE
Glucose, UA: NEGATIVE mg/dL
HGB URINE DIPSTICK: NEGATIVE
KETONES UR: NEGATIVE mg/dL
NITRITE: NEGATIVE
PROTEIN: NEGATIVE mg/dL
SPECIFIC GRAVITY, URINE: 1.01 (ref 1.005–1.030)
pH: 6 (ref 5.0–8.0)

## 2015-11-12 LAB — TYPE AND SCREEN
ABO/RH(D): O POS
ANTIBODY SCREEN: NEGATIVE
UNIT DIVISION: 0
Unit division: 0

## 2015-11-12 LAB — CBC
HCT: 24.6 % — ABNORMAL LOW (ref 35.0–47.0)
HEMOGLOBIN: 7.6 g/dL — AB (ref 12.0–16.0)
MCH: 20.3 pg — ABNORMAL LOW (ref 26.0–34.0)
MCHC: 30.9 g/dL — ABNORMAL LOW (ref 32.0–36.0)
MCV: 65.6 fL — AB (ref 80.0–100.0)
PLATELETS: 267 10*3/uL (ref 150–440)
RBC: 3.75 MIL/uL — AB (ref 3.80–5.20)
RDW: 26 % — ABNORMAL HIGH (ref 11.5–14.5)
WBC: 7.5 10*3/uL (ref 3.6–11.0)

## 2015-11-12 LAB — OB RESULTS CONSOLE HIV ANTIBODY (ROUTINE TESTING): HIV: NONREACTIVE

## 2015-11-12 LAB — OB RESULTS CONSOLE RPR: RPR: NONREACTIVE

## 2015-11-12 LAB — OB RESULTS CONSOLE VARICELLA ZOSTER ANTIBODY, IGG: VARICELLA IGG: NON-IMMUNE/NOT IMMUNE

## 2015-11-12 LAB — PATHOLOGIST SMEAR REVIEW

## 2015-11-12 LAB — OB RESULTS CONSOLE HEPATITIS B SURFACE ANTIGEN: Hepatitis B Surface Ag: NEGATIVE

## 2015-11-12 LAB — OB RESULTS CONSOLE RUBELLA ANTIBODY, IGM: Rubella: IMMUNE

## 2015-11-12 MED ORDER — DOXYLAMINE-PYRIDOXINE 10-10 MG PO TBEC: 2.0000 | DELAYED_RELEASE_TABLET | Freq: Every day | ORAL | Status: DC

## 2015-11-12 NOTE — Discharge Instructions (Signed)
Anemia, Nonspecific Anemia is a condition in which the concentration of red blood cells or hemoglobin in the blood is below normal. Hemoglobin is a substance in red blood cells that carries oxygen to the tissues of the body. Anemia results in not enough oxygen reaching these tissues.  CAUSES  Common causes of anemia include:   Excessive bleeding. Bleeding may be internal or external. This includes excessive bleeding from periods (in women) or from the intestine.   Poor nutrition.   Chronic kidney, thyroid, and liver disease.  Bone marrow disorders that decrease red blood cell production.  Cancer and treatments for cancer.  HIV, AIDS, and their treatments.  Spleen problems that increase red blood cell destruction.  Blood disorders.  Excess destruction of red blood cells due to infection, medicines, and autoimmune disorders. SIGNS AND SYMPTOMS   Minor weakness.   Dizziness.   Headache.  Palpitations.   Shortness of breath, especially with exercise.   Paleness.  Cold sensitivity.  Indigestion.  Nausea.  Difficulty sleeping.  Difficulty concentrating. Symptoms may occur suddenly or they may develop slowly.  DIAGNOSIS  Additional blood tests are often needed. These help your health care provider determine the best treatment. Your health care provider will check your stool for blood and look for other causes of blood loss.  TREATMENT  Treatment varies depending on the cause of the anemia. Treatment can include:   Supplements of iron, vitamin B12, or folic acid.   Hormone medicines.   A blood transfusion. This may be needed if blood loss is severe.   Hospitalization. This may be needed if there is significant continual blood loss.   Dietary changes.  Spleen removal. HOME CARE INSTRUCTIONS Keep all follow-up appointments. It often takes many weeks to correct anemia, and having your health care provider check on your condition and your response to  treatment is very important. SEEK IMMEDIATE MEDICAL CARE IF:   You develop extreme weakness, shortness of breath, or chest pain.   You become dizzy or have trouble concentrating.  You develop heavy vaginal bleeding.   You develop a rash.   You have bloody or black, tarry stools.   You faint.   You vomit up blood.   You vomit repeatedly.   You have abdominal pain.  You have a fever or persistent symptoms for more than 2-3 days.   You have a fever and your symptoms suddenly get worse.   You are dehydrated.  MAKE SURE YOU:  Understand these instructions.  Will watch your condition.  Will get help right away if you are not doing well or get worse.   This information is not intended to replace advice given to you by your health care provider. Make sure you discuss any questions you have with your health care provider.   Document Released: 11/23/2004 Document Revised: 06/18/2013 Document Reviewed: 04/11/2013 Elsevier Interactive Patient Education 2016 Elsevier Inc.  

## 2015-11-12 NOTE — Discharge Summary (Signed)
Physician Discharge Summary   Patient ID: Melissa RutherfordHarley R Coffey [id number] 20 y.o. 1996/03/01  Admit date: 11/11/2015  Discharge date and time: 11/12/2015    Admitting Physician: [place] Bingharlie Pickens, MD   Discharge Physician: Annamarie MajorPaul Bradford Cazier, MD  Admission Diagnoses: Anemia affecting pregnancy [O99.019]  Discharge Diagnoses: Same  Admission Condition: good  Discharged Condition: good  Indication for Admission: Pregnancy 10 weeks with symptomatic anemia, Hgb to 5.  Fatigue, dizziness, SOB. Also having nausea and vomiting of pregnancy  Hospital Course: Patient has received 2 U pRBCs as transfusion, tolerated well, improved symptoms.    Consults: None  Significant Diagnostic Studies: labs: Hgb now 7.6  Treatments: IV hydration and transfusion  Discharge Exam: BP 90/49 mmHg  Pulse 85  Temp(Src) 98.3 F (36.8 C) (Oral)  Resp 20  Ht 5\' 4"  (1.626 m)  Wt 159 lb 14.4 oz (72.53 kg)  BMI 27.43 kg/m2  SpO2 100%  General Appearance:    Alert, cooperative, no distress, appears stated age  Abdomen:     Soft, non-tender, bowel sounds active all four quadrants,    no masses, no organomegaly  Extremities:   Extremities normal, atraumatic, no cyanosis or edema  Pulses:   2+ and symmetric all extremities  Skin:   Skin color, texture, turgor normal, no rashes or lesions    Disposition:  Home, Fe, Nausea therapy, 2 week f/u  Patient Instructions:    Medication List    TAKE these medications        Doxylamine-Pyridoxine 10-10 MG Tbec  Commonly known as:  DICLEGIS  Take 2 tablets by mouth at bedtime.     ferrous sulfate 325 (65 FE) MG tablet  Take 325 mg by mouth daily with breakfast.     prenatal multivitamin Tabs tablet  Take 1 tablet by mouth daily at 12 noon.       Activity: activity as tolerated Diet: regular diet  Follow-up with [facility] in 2 weeks.  Signed: Letitia Coffey,Melissa Coffey 11/12/2015 9:23 AM

## 2015-11-12 NOTE — Progress Notes (Signed)
D/C order from MD.  Reviewed d/c instructions and prescriptions with patient and answered any questions.  Patient d/c home via wheelchair by nursing/auxillary. 

## 2015-11-12 NOTE — Progress Notes (Signed)
[facility] [id number] [place] [id number] Phone: [phone] Fax: [phone]  November 12, 2015  Patient: Melissa RutherfordHarley R Coffey  Date of Birth: November 02, 1995  Date of Visit: 11/11/2015    To Whom It May Concern:  Rosalin HawkingHarley Coffey was seen and treated in our [facility] on 11/11/2015-11/12/15.  Melissa RutherfordHarley R Coffey  may return to work on 1.16.2017.  Sincerely,  Annamarie MajorPaul Toniqua Melamed, MD [facility]

## 2016-01-11 ENCOUNTER — Ambulatory Visit
Admission: RE | Admit: 2016-01-11 | Discharge: 2016-01-11 | Disposition: A | Discharge status: Home / Self Care | Payer: Managed Care, Other (non HMO) | Source: Ambulatory Visit | Attending: Obstetrics and Gynecology | Admitting: Obstetrics and Gynecology

## 2016-01-11 ENCOUNTER — Other Ambulatory Visit: Payer: Self-pay | Admitting: Obstetrics and Gynecology

## 2016-01-11 DIAGNOSIS — Z3A19 19 weeks gestation of pregnancy: Secondary | ICD-10-CM | POA: Insufficient documentation

## 2016-01-11 DIAGNOSIS — O99012 Anemia complicating pregnancy, second trimester: Secondary | ICD-10-CM | POA: Insufficient documentation

## 2016-01-11 DIAGNOSIS — Z01812 Encounter for preprocedural laboratory examination: Secondary | ICD-10-CM | POA: Diagnosis not present

## 2016-01-11 DIAGNOSIS — D509 Iron deficiency anemia, unspecified: Secondary | ICD-10-CM

## 2016-01-12 ENCOUNTER — Ambulatory Visit
Admission: RE | Admit: 2016-01-12 | Discharge: 2016-01-12 | Disposition: A | Discharge status: Home / Self Care | Payer: Managed Care, Other (non HMO) | Source: Ambulatory Visit | Attending: Obstetrics and Gynecology | Admitting: Obstetrics and Gynecology

## 2016-01-12 DIAGNOSIS — Z3A19 19 weeks gestation of pregnancy: Secondary | ICD-10-CM | POA: Insufficient documentation

## 2016-01-12 DIAGNOSIS — O99012 Anemia complicating pregnancy, second trimester: Secondary | ICD-10-CM | POA: Diagnosis not present

## 2016-01-12 LAB — PREPARE RBC (CROSSMATCH)

## 2016-01-12 MED ORDER — DIPHENHYDRAMINE HCL 25 MG PO CAPS
ORAL_CAPSULE | ORAL | Status: AC
  Administered 2016-01-12: 25 mg via ORAL
  Filled 2016-01-12: qty 1

## 2016-01-12 MED ORDER — ACETAMINOPHEN 325 MG PO TABS
ORAL_TABLET | ORAL | Status: AC
Start: 2016-01-12 — End: 2016-01-12
  Administered 2016-01-12: 650 mg via ORAL
  Filled 2016-01-12: qty 2

## 2016-01-12 MED ORDER — ACETAMINOPHEN 325 MG PO TABS
650.0000 mg | ORAL_TABLET | Freq: Once | ORAL | Status: AC
  Administered 2016-01-12: 650 mg via ORAL

## 2016-01-12 MED ORDER — SODIUM CHLORIDE 0.9 % IV SOLN: Freq: Once | INTRAVENOUS | Status: DC

## 2016-01-12 MED ORDER — DIPHENHYDRAMINE HCL 25 MG PO CAPS
25.0000 mg | ORAL_CAPSULE | Freq: Once | ORAL | Status: AC
  Administered 2016-01-12: 25 mg via ORAL

## 2016-01-12 NOTE — OR Nursing (Signed)
1 unit PRBC infused.  Pt states she feels fine. IV discontinued and pt discharged home stable.

## 2016-01-13 LAB — TYPE AND SCREEN
ABO/RH(D): O POS
Antibody Screen: NEGATIVE
EXTEND SAMPLE REASON: TRANSFUSED
UNIT DIVISION: 0
UNIT DIVISION: 0

## 2016-01-27 ENCOUNTER — Ambulatory Visit: Payer: Managed Care, Other (non HMO) | Admitting: Oncology

## 2016-01-31 ENCOUNTER — Inpatient Hospital Stay: Payer: Managed Care, Other (non HMO) | Attending: Oncology | Admitting: Oncology

## 2016-01-31 ENCOUNTER — Inpatient Hospital Stay: Payer: Managed Care, Other (non HMO)

## 2016-01-31 VITALS — BP 112/75 | HR 101 | Temp 97.9°F | Resp 18 | Wt 168.9 lb

## 2016-01-31 DIAGNOSIS — O99012 Anemia complicating pregnancy, second trimester: Secondary | ICD-10-CM | POA: Diagnosis not present

## 2016-01-31 DIAGNOSIS — D72819 Decreased white blood cell count, unspecified: Secondary | ICD-10-CM | POA: Diagnosis not present

## 2016-01-31 DIAGNOSIS — D509 Iron deficiency anemia, unspecified: Secondary | ICD-10-CM

## 2016-01-31 LAB — CBC WITH DIFFERENTIAL/PLATELET
Basophils Absolute: 0 10*3/uL (ref 0–0.1)
Basophils Relative: 0 %
EOS PCT: 0 %
Eosinophils Absolute: 0 10*3/uL (ref 0–0.7)
HCT: 29.9 % — ABNORMAL LOW (ref 35.0–47.0)
Hemoglobin: 9.5 g/dL — ABNORMAL LOW (ref 12.0–16.0)
LYMPHS ABS: 2 10*3/uL (ref 1.0–3.6)
LYMPHS PCT: 16 %
MCH: 20.8 pg — AB (ref 26.0–34.0)
MCHC: 31.6 g/dL — ABNORMAL LOW (ref 32.0–36.0)
MCV: 65.7 fL — AB (ref 80.0–100.0)
MONO ABS: 0.5 10*3/uL (ref 0.2–0.9)
MONOS PCT: 4 %
Neutro Abs: 10.1 10*3/uL — ABNORMAL HIGH (ref 1.4–6.5)
Neutrophils Relative %: 80 %
PLATELETS: 383 10*3/uL (ref 150–440)
RBC: 4.56 MIL/uL (ref 3.80–5.20)
RDW: 21.7 % — ABNORMAL HIGH (ref 11.5–14.5)
WBC: 12.7 10*3/uL — AB (ref 3.6–11.0)

## 2016-01-31 LAB — SAMPLE TO BLOOD BANK

## 2016-01-31 LAB — IRON AND TIBC
IRON: 34 ug/dL (ref 28–170)
Saturation Ratios: 5 % — ABNORMAL LOW (ref 10.4–31.8)
TIBC: 639 ug/dL — ABNORMAL HIGH (ref 250–450)
UIBC: 605 ug/dL

## 2016-01-31 LAB — FERRITIN: FERRITIN: 4 ng/mL — AB (ref 11–307)

## 2016-01-31 NOTE — Progress Notes (Signed)
Patient is here for anemia during pregnancy evaluation.  She does have history of anemia but not required transfusion until recent pregnancy with her due date of 06/03/2016.  The blood transfusions were given in 10/2015 and in 12/2015 but has not had any labs checked since last transfusion.  She is feeling like her hemoglobin is dropping because she was feeling lightheaded when she woke up this morning.

## 2016-02-06 NOTE — Progress Notes (Signed)
[facility]  [phone] [phone]  ID: Melissa RutherfordHarley R Coffey OB: [birth year]  MR#: [id number]  WNU#:[id number]#:[id number]  Patient Care Team: No Pcp Per Patient as PCP - General (General Practice)  CHIEF COMPLAINT:  Chief Complaint  Patient presents with  . New Evaluation    anemia    INTERVAL HISTORY: Patient is a 20 year old female who was found to have significant anemia requiring blood transfusion during pregnancy. Patient states she has told she is anemic before, but did not require transfusion. Patient received blood transfusions in January and March of this year. She feels slightly lightheaded, but otherwise feels well. She has no other neurologic complaints. She is gaining weight appropriately. She has mild weakness and fatigue. She denies any chest pain or shortness of breath. She denies any nausea, vomiting, constipation, or diarrhea. She has no urinary complaints. Patient otherwise feels well and offers no further specific complaints.  REVIEW OF SYSTEMS:   Review of Systems  Constitutional: Positive for malaise/fatigue. Negative for fever and weight loss.  Respiratory: Negative.  Negative for shortness of breath.   Cardiovascular: Negative.  Negative for chest pain.  Gastrointestinal: Negative.  Negative for melena.  Genitourinary: Negative.   Musculoskeletal: Negative.   Neurological: Positive for dizziness and weakness.    As per HPI. Otherwise, a complete review of systems is negatve.  PAST MEDICAL HISTORY: Past Medical History  Diagnosis Date  . Anemia   . Endometriosis     PAST SURGICAL HISTORY: Past Surgical History  Procedure Laterality Date  . Cesarean section  02/2014    pLTCS. FITL at term    FAMILY HISTORY: Reviewed and unchanged. No reported history of malignancy or chronic disease.     ADVANCED DIRECTIVES:    HEALTH MAINTENANCE: Social History  Substance Use Topics  . Smoking status: Never Smoker   . Smokeless  tobacco: Not on file  . Alcohol Use: No     Colonoscopy:  PAP:  Bone density:  Lipid panel:  No Known Allergies  Current Outpatient Prescriptions  Medication Sig Dispense Refill  . ferrous sulfate 325 (65 FE) MG tablet Take 325 mg by mouth 2 (two) times daily with a meal.     . Prenatal Vit-Fe Fumarate-FA (PRENATAL MULTIVITAMIN) TABS tablet Take 1 tablet by mouth daily at 12 noon.     No current facility-administered medications for this visit.    OBJECTIVE: Filed Vitals:   01/31/16 1544  BP: 112/75  Pulse: 101  Temp: 97.9 F (36.6 C)  Resp: 18     Body mass index is 28.97 kg/(m^2).    ECOG FS:0 - Asymptomatic  General: Well-developed, well-nourished, no acute distress. Eyes: Pink conjunctiva, anicteric sclera. HEENT: Normocephalic, moist mucous membranes, clear oropharnyx. Lungs: Clear to auscultation bilaterally. Heart: Regular rate and rhythm. No rubs, murmurs, or gallops. Abdomen: Appears appropriate for gestational age. Musculoskeletal: No edema, cyanosis, or clubbing. Neuro: Alert, answering all questions appropriately. Cranial nerves grossly intact. Skin: No rashes or petechiae noted. Psych: Normal affect. Lymphatics: No cervical, calvicular, axillary or inguinal LAD.   LAB RESULTS:  Lab Results  Component Value Date   NA 135 11/11/2015   K 3.5 11/11/2015   CL 107 11/11/2015   CO2 21* 11/11/2015   GLUCOSE 89 11/11/2015   BUN 15 11/11/2015   CREATININE 0.41* 11/11/2015   CALCIUM 9.1 11/11/2015   PROT 6.7 10/10/2013   ALBUMIN 2.7* 10/10/2013   AST 14 10/10/2013   ALT 11* 10/10/2013   ALKPHOS 92 10/10/2013  BILITOT 0.2 10/10/2013   GFRNONAA >60 11/11/2015   GFRAA >60 11/11/2015    Lab Results  Component Value Date   WBC 12.7* 01/31/2016   NEUTROABS 10.1* 01/31/2016   HGB 9.5* 01/31/2016   HCT 29.9* 01/31/2016   MCV 65.7* 01/31/2016   PLT 383 01/31/2016   Lab Results  Component Value Date   IRON 34 01/31/2016   TIBC 639* 01/31/2016    IRONPCTSAT 5* 01/31/2016    Lab Results  Component Value Date   FERRITIN 4* 01/31/2016     STUDIES: No results found.  ASSESSMENT: Iron deficiency anemia in pregnancy.  PLAN:    1. Iron deficiency anemia: Patient's hemoglobin continues to be decreased, but improved, despite receiving blood transfusions in January and March 2017. Her iron stores remain significantly low. The remainder of her anemia workup is either negative or within normal limits. Return to clinic in 2 weeks for further evaluation and consideration of 510 mg IV Feraheme. 2. Leukocytosis: Likely reactive, monitor. 3. Pregnancy: Patient is due date is June 03, 2016.  Patient expressed understanding and was in agreement with this plan. She also understands that She can call clinic at any time with any questions, concerns, or complaints.    Jeralyn Ruths, MD   02/06/2016 11:02 AM

## 2016-02-11 ENCOUNTER — Other Ambulatory Visit: Payer: Self-pay | Admitting: Oncology

## 2016-02-11 DIAGNOSIS — O99012 Anemia complicating pregnancy, second trimester: Secondary | ICD-10-CM

## 2016-02-14 ENCOUNTER — Inpatient Hospital Stay: Payer: Managed Care, Other (non HMO)

## 2016-02-14 ENCOUNTER — Inpatient Hospital Stay: Payer: Managed Care, Other (non HMO) | Admitting: Oncology

## 2016-02-21 ENCOUNTER — Inpatient Hospital Stay: Payer: Managed Care, Other (non HMO)

## 2016-02-21 ENCOUNTER — Inpatient Hospital Stay: Payer: Managed Care, Other (non HMO) | Admitting: Oncology

## 2016-03-22 ENCOUNTER — Observation Stay
Admission: EM | Admit: 2016-03-22 | Discharge: 2016-03-22 | Disposition: A | Discharge status: Home / Self Care | Payer: Managed Care, Other (non HMO) | Attending: Obstetrics & Gynecology | Admitting: Obstetrics & Gynecology

## 2016-03-22 ENCOUNTER — Encounter: Payer: Self-pay | Admitting: *Deleted

## 2016-03-22 DIAGNOSIS — O2343 Unspecified infection of urinary tract in pregnancy, third trimester: Secondary | ICD-10-CM | POA: Diagnosis not present

## 2016-03-22 DIAGNOSIS — Z3A29 29 weeks gestation of pregnancy: Secondary | ICD-10-CM | POA: Insufficient documentation

## 2016-03-22 DIAGNOSIS — O9989 Other specified diseases and conditions complicating pregnancy, childbirth and the puerperium: Secondary | ICD-10-CM | POA: Diagnosis present

## 2016-03-22 LAB — URINALYSIS COMPLETE WITH MICROSCOPIC (ARMC ONLY)
BILIRUBIN URINE: NEGATIVE
Glucose, UA: NEGATIVE mg/dL
HGB URINE DIPSTICK: NEGATIVE
Ketones, ur: NEGATIVE mg/dL
Nitrite: NEGATIVE
PH: 6 (ref 5.0–8.0)
Protein, ur: NEGATIVE mg/dL
SPECIFIC GRAVITY, URINE: 1.004 — AB (ref 1.005–1.030)

## 2016-03-22 MED ORDER — NITROFURANTOIN MONOHYD MACRO 100 MG PO CAPS
100.0000 mg | ORAL_CAPSULE | Freq: Two times a day (BID) | ORAL | Status: DC
  Administered 2016-03-22: 100 mg via ORAL

## 2016-03-22 MED ORDER — ACETAMINOPHEN 325 MG PO TABS
650.0000 mg | ORAL_TABLET | ORAL | Status: DC | PRN
  Administered 2016-03-22: 650 mg via ORAL
  Filled 2016-03-22: qty 2

## 2016-03-22 MED ORDER — NITROFURANTOIN MONOHYD MACRO 100 MG PO CAPS: 100.0000 mg | ORAL_CAPSULE | Freq: Two times a day (BID) | ORAL | Status: DC

## 2016-03-22 MED ORDER — NITROFURANTOIN MONOHYD MACRO 100 MG PO CAPS
ORAL_CAPSULE | ORAL | Status: AC
  Administered 2016-03-22: 100 mg via ORAL
  Filled 2016-03-22: qty 1

## 2016-03-22 MED ORDER — ONDANSETRON HCL 4 MG/2ML IJ SOLN: 4.0000 mg | Freq: Four times a day (QID) | INTRAMUSCULAR | Status: DC | PRN

## 2016-03-22 NOTE — Progress Notes (Signed)
Shift report received from Laurine BlazerG. Kirby, RN. Pt 29.4wks, G2P1 in triage with c/o abd cramping, R lower abd and back discomfort. Hx previous c-section. Urine sent for UA, awaiting MD orders.

## 2016-03-22 NOTE — OB Triage Note (Signed)
Presents with complaints of cramping simce 4pm. Denies bleeding or leakage of fluid. States some burning with urination. States cramping is more on R lower side and sometimes radiates to back. UA  Obtained and sent to lab.

## 2016-03-22 NOTE — OB Triage Note (Signed)
Dr Tiburcio PeaHarris at bedside, assessed pt and UA results reviewed and explained to pt. VSS, afebrile. FHT reactive given gestational age, no contractions tracing or palpated during triage stay. Discharge teaching with preterm labor precautions reviewed with pt, s/o and family present. Encouraged pt to drink plenty fluids throughout the day and night to stay well hydrated, rest, warm shower, heat pack to lower back and f/u in clinic is symptoms persist or worsens. Will provide pt with rx to treat urinary sym and advised pt to f/u at her next scheduled OB visit.

## 2016-03-22 NOTE — Progress Notes (Signed)
At bedside to assess pt, lying calm in bed, smiling and talking with ease to family present in room. Pt and family reports hx chronic kidney and bladder inf during her previous pregnancy. Pt says she has been working long shift approx 9 hours per day at least 4 days a week, gets a couple 15 min break and 30 min lunch break but she is on her feet most of the day, admits she does drink soda but has been trying to drink more water and tries to stay well hydrated while at work. VSS, afebrile.

## 2016-03-22 NOTE — Discharge Instructions (Signed)

## 2016-03-22 NOTE — Final Progress Note (Signed)
Physician Final Progress Note  Patient ID: Melissa RutherfordHarley R Coffey MRN: [id number] DOB/AGE: September 09, 1996 20 y.o.  Admit date: 03/22/2016 Admitting provider: Nadara Mustardobert P Harris, MD Discharge date: 03/22/2016   Admission Diagnoses: right sided back pain, [redacted] weeks pregnant  Discharge Diagnoses:  Active Problems:   Back pain affecting pregnancy  Consults: None   Exam: AF- no fever, VSS Chest clear Heart reg Abd gravid, ND, mild RLQ T Extr no calf T or edema Back diffuse flank T, mild CVAT Pelvic deferred  Diagnosis of UTI with possibility of pyelo discussed; due to no fever and pt feeling good will start treatment as an outpt with po Macrobid.  Pt to return w fever, pain, dysuria, ctxs.  Significant Findings/ Diagnostic Studies: labs: UA- + WBC and leukocytes and bacteria  Procedures: A NST procedure was performed with FHR monitoring and a normal baseline established for a preterm pregnancy, appropriate time of 20-40 minutes of evaluation, and accels >2 seen w 10x10 characteristics.  Results show a REACTIVE NST.   Discharge Condition: good  Disposition: 01-Home or Self Care  Diet: Regular diet  Discharge Activity: Activity as tolerated     Medication List    TAKE these medications        ferrous sulfate 325 (65 FE) MG tablet  Take 325 mg by mouth 2 (two) times daily with a meal.     nitrofurantoin (macrocrystal-monohydrate) 100 MG capsule  Commonly known as:  MACROBID  Take 1 capsule (100 mg total) by mouth 2 (two) times daily.     prenatal multivitamin Tabs tablet  Take 1 tablet by mouth daily at 12 noon.           Follow-up Information    [facility] week.   Specialty:  Obstetrics and Gynecology   Why:  as scheduled   Contact information:   [address] [place] [address] [phone]       Total time spent taking care of this patient: 15 minutes  Signed: Letitia Libraobert Paul Harris 03/22/2016, 9:03 PM

## 2016-04-03 ENCOUNTER — Encounter: Payer: Self-pay | Admitting: Emergency Medicine

## 2016-04-03 ENCOUNTER — Emergency Department
Admission: EM | Admit: 2016-04-03 | Discharge: 2016-04-03 | Disposition: A | Discharge status: Home / Self Care | Payer: Managed Care, Other (non HMO) | Attending: Emergency Medicine | Admitting: Emergency Medicine

## 2016-04-03 DIAGNOSIS — Z3A31 31 weeks gestation of pregnancy: Secondary | ICD-10-CM | POA: Diagnosis not present

## 2016-04-03 DIAGNOSIS — R531 Weakness: Secondary | ICD-10-CM

## 2016-04-03 DIAGNOSIS — O2343 Unspecified infection of urinary tract in pregnancy, third trimester: Secondary | ICD-10-CM | POA: Insufficient documentation

## 2016-04-03 DIAGNOSIS — N390 Urinary tract infection, site not specified: Secondary | ICD-10-CM

## 2016-04-03 LAB — URINALYSIS COMPLETE WITH MICROSCOPIC (ARMC ONLY)
BILIRUBIN URINE: NEGATIVE
Bacteria, UA: NONE SEEN
GLUCOSE, UA: NEGATIVE mg/dL
Hgb urine dipstick: NEGATIVE
Ketones, ur: NEGATIVE mg/dL
Nitrite: POSITIVE — AB
PH: 6 (ref 5.0–8.0)
PROTEIN: NEGATIVE mg/dL
Specific Gravity, Urine: 1.013 (ref 1.005–1.030)

## 2016-04-03 LAB — CBC WITH DIFFERENTIAL/PLATELET
BASOS ABS: 0.2 10*3/uL — AB (ref 0–0.1)
Basophils Relative: 1 %
EOS ABS: 0 10*3/uL (ref 0–0.7)
HCT: 28.5 % — ABNORMAL LOW (ref 35.0–47.0)
Hemoglobin: 9.2 g/dL — ABNORMAL LOW (ref 12.0–16.0)
Lymphocytes Relative: 8 %
Lymphs Abs: 1.4 10*3/uL (ref 1.0–3.6)
MCH: 21.3 pg — ABNORMAL LOW (ref 26.0–34.0)
MCHC: 32.3 g/dL (ref 32.0–36.0)
MCV: 65.7 fL — ABNORMAL LOW (ref 80.0–100.0)
Monocytes Absolute: 0.4 10*3/uL (ref 0.2–0.9)
Monocytes Relative: 2 %
Neutro Abs: 14.7 10*3/uL — ABNORMAL HIGH (ref 1.4–6.5)
Neutrophils Relative %: 89 %
PLATELETS: 326 10*3/uL (ref 150–440)
RBC: 4.34 MIL/uL (ref 3.80–5.20)
RDW: 18.2 % — ABNORMAL HIGH (ref 11.5–14.5)
WBC: 16.7 10*3/uL — AB (ref 3.6–11.0)

## 2016-04-03 MED ORDER — NITROFURANTOIN MONOHYD MACRO 100 MG PO CAPS
100.0000 mg | ORAL_CAPSULE | Freq: Two times a day (BID) | ORAL | Status: AC
Start: 2016-04-03 — End: 2016-04-10

## 2016-04-03 NOTE — ED Provider Notes (Signed)
[facility] Emergency Department Provider Note  Time seen: 2:30 PM  I have reviewed the triage vital signs and the nursing notes.   HISTORY  Chief Complaint Weakness    HPI Melissa Coffey is a 20 y.o. female approximately [redacted] weeks pregnant with a history of anemia presents the emergency department for generalized weakness. According to the patient she has required blood transfusion during this pregnancy for significant anemia.Patient states all day today she has been feeling very weak and fatigued, and she was concerned that her blood level may have dropped. Denies any lack or bloody stool. Denies any vaginal bleeding. Patient denies any focal weakness. Denies any pain.     Past Medical History  Diagnosis Date  . Anemia   . Endometriosis     Patient Active Problem List   Diagnosis Date Noted  . Back pain affecting pregnancy 03/22/2016  . Anemia in pregnancy 11/11/2015    Past Surgical History  Procedure Laterality Date  . Cesarean section  02/2014    pLTCS. FITL at term    Current Outpatient Rx  Name  Route  Sig  Dispense  Refill  . ferrous sulfate 325 (65 FE) MG tablet   Oral   Take 325 mg by mouth 2 (two) times daily with a meal.          . nitrofurantoin, macrocrystal-monohydrate, (MACROBID) 100 MG capsule   Oral   Take 1 capsule (100 mg total) by mouth 2 (two) times daily.   10 capsule   0   . Prenatal Vit-Fe Fumarate-FA (PRENATAL MULTIVITAMIN) TABS tablet   Oral   Take 1 tablet by mouth daily at 12 noon.           Allergies Review of patient's allergies indicates no known allergies.  No family history on file.  Social History Social History  Substance Use Topics  . Smoking status: Never Smoker   . Smokeless tobacco: None  . Alcohol Use: No    Review of Systems Constitutional: Negative for fever. Cardiovascular: Negative for chest pain. Respiratory: Negative for shortness of breath. Gastrointestinal: Negative for  abdominal pain, vomiting and diarrhea. Genitourinary: Negative for dysuria. Musculoskeletal: Mild lower back pain Neurological: Negative for headaches, focal weakness or numbness. 10-point ROS otherwise negative.  ____________________________________________   PHYSICAL EXAM:  VITAL SIGNS: ED Triage Vitals  Enc Vitals Group     BP 04/03/16 1229 113/66 mmHg     Pulse Rate 04/03/16 1229 105     Resp 04/03/16 1229 20     Temp 04/03/16 1229 98.2 F (36.8 C)     Temp Source 04/03/16 1229 Oral     SpO2 04/03/16 1229 99 %     Weight 04/03/16 1229 170 lb (77.111 kg)     Height 04/03/16 1229  (1.626 m)     Head Cir --      Peak Flow --      Pain Score 04/03/16 1230 6     Pain Loc --      Pain Edu? --      Excl. in GC? --     Constitutional: Alert and oriented. Well appearing and in no distress. Eyes: Normal exam ENT   Head: Normocephalic and atraumatic.   Mouth/Throat: Mucous membranes are moist. Cardiovascular: Normal rate, regular rhythm. No murmur Respiratory: Normal respiratory effort without tachypnea nor retractions. Breath sounds are clear  Gastrointestinal: Gravid abdomen, nontender. Musculoskeletal: Nontender with normal range of motion in all extremities.  Neurologic:  Normal speech and language. No gross focal neurologic deficits Skin:  Skin is warm, dry and intact.  Psychiatric: Mood and affect are normal. Speech and behavior are normal.  ____________________________________________    INITIAL IMPRESSION / ASSESSMENT AND PLAN / ED COURSE  Pertinent labs & imaging results that were available during my care of the patient were reviewed by me and considered in my medical decision making (see chart for details).  Patient presents with generalized fatigue/weakness which she thought could be due to anemia. Patient required a blood transfusion in March for significant anemia. Patient's hemoglobin is 9.2, fairly steady from the last known hemoglobin in April.  Patient has a normal exam. Appears well. We will check a urinalysis to rule out urinary tract infection, if negative the patient will be discharged home with OB follow-up.  Urinalysis shows nitrite positive urine. We will dose Macrobid, and have the patient follow up with her PCP.  ____________________________________________   FINAL CLINICAL IMPRESSION(S) / ED DIAGNOSES  Generalized weakness Urinary tract infection  Minna AntisKevin Maresha Anastos, MD 04/03/16 1452

## 2016-04-03 NOTE — Discharge Instructions (Signed)
Urinary Tract Infection °Urinary tract infections (UTIs) can develop anywhere along your urinary tract. Your urinary tract is your body's drainage system for removing wastes and extra water. Your urinary tract includes two kidneys, two ureters, a bladder, and a urethra. Your kidneys are a pair of bean-shaped organs. Each kidney is about the size of your fist. They are located below your ribs, one on each side of your spine. °CAUSES °Infections are caused by microbes, which are microscopic organisms, including fungi, viruses, and bacteria. These organisms are so small that they can only be seen through a microscope. Bacteria are the microbes that most commonly cause UTIs. °SYMPTOMS  °Symptoms of UTIs may vary by age and gender of the patient and by the location of the infection. Symptoms in young women typically include a frequent and intense urge to urinate and a painful, burning feeling in the bladder or urethra during urination. Older women and men are more likely to be tired, shaky, and weak and have muscle aches and abdominal pain. A fever may mean the infection is in your kidneys. Other symptoms of a kidney infection include pain in your back or sides below the ribs, nausea, and vomiting. °DIAGNOSIS °To diagnose a UTI, your caregiver will ask you about your symptoms. Your caregiver will also ask you to provide a urine sample. The urine sample will be tested for bacteria and white blood cells. White blood cells are made by your body to help fight infection. °TREATMENT  °Typically, UTIs can be treated with medication. Because most UTIs are caused by a bacterial infection, they usually can be treated with the use of antibiotics. The choice of antibiotic and length of treatment depend on your symptoms and the type of bacteria causing your infection. °HOME CARE INSTRUCTIONS °· If you were prescribed antibiotics, take them exactly as your caregiver instructs you. Finish the medication even if you feel better after  you have only taken some of the medication. °· Drink enough water and fluids to keep your urine clear or pale yellow. °· Avoid caffeine, tea, and carbonated beverages. They tend to irritate your bladder. °· Empty your bladder often. Avoid holding urine for long periods of time. °· Empty your bladder before and after sexual intercourse. °· After a bowel movement, women should cleanse from front to back. Use each tissue only once. °SEEK MEDICAL CARE IF:  °· You have back pain. °· You develop a fever. °· Your symptoms do not begin to resolve within 3 days. °SEEK IMMEDIATE MEDICAL CARE IF:  °· You have severe back pain or lower abdominal pain. °· You develop chills. °· You have nausea or vomiting. °· You have continued burning or discomfort with urination. °MAKE SURE YOU:  °· Understand these instructions. °· Will watch your condition. °· Will get help right away if you are not doing well or get worse. °  °This information is not intended to replace advice given to you by your health care provider. Make sure you discuss any questions you have with your health care provider. °  °Document Released: 07/26/2005 Document Revised: 07/07/2015 Document Reviewed: 11/24/2011 °Elsevier Interactive Patient Education ©2016 [employer]vier Inc. ° °Weakness °Weakness is a lack of strength. It may be felt all over the body (generalized) or in one specific part of the body (focal). Some causes of weakness can be serious. You may need further medical evaluation, especially if you are elderly or you have a history of immunosuppression (such as chemotherapy or HIV), kidney disease, heart disease, or   diabetes. °CAUSES  °Weakness can be caused by many different things, including: °· Infection. °· Physical exhaustion. °· Internal bleeding or other blood loss that results in a lack of red blood cells (anemia). °· Dehydration. This cause is more common in elderly people. °· Side effects or electrolyte abnormalities from medicines, such as pain  medicines or sedatives. °· Emotional distress, anxiety, or depression. °· Circulation problems, especially severe peripheral arterial disease. °· Heart disease, such as rapid atrial fibrillation, bradycardia, or heart failure. °· Nervous system disorders, such as Guillain-Barré syndrome, multiple sclerosis, or stroke. °DIAGNOSIS  °To find the cause of your weakness, your caregiver will take your history and perform a physical exam. Lab tests or X-rays may also be ordered, if needed. °TREATMENT  °Treatment of weakness depends on the cause of your symptoms and can vary greatly. °HOME CARE INSTRUCTIONS  °· Rest as needed. °· Eat a well-balanced diet. °· Try to get some exercise every day. °· Only take over-the-counter or prescription medicines as directed by your caregiver. °SEEK MEDICAL CARE IF:  °· Your weakness seems to be getting worse or spreads to other parts of your body. °· You develop new aches or pains. °SEEK IMMEDIATE MEDICAL CARE IF:  °· You cannot perform your normal daily activities, such as getting dressed and feeding yourself. °· You cannot walk up and down stairs, or you feel exhausted when you do so. °· You have shortness of breath or chest pain. °· You have difficulty moving parts of your body. °· You have weakness in only one area of the body or on only one side of the body. °· You have a fever. °· You have trouble speaking or swallowing. °· You cannot control your bladder or bowel movements. °· You have black or bloody vomit or stools. °MAKE SURE YOU: °· Understand these instructions. °· Will watch your condition. °· Will get help right away if you are not doing well or get worse. °  °This information is not intended to replace advice given to you by your health care provider. Make sure you discuss any questions you have with your health care provider. °  °Document Released: 10/16/2005 Document Revised: 04/16/2012 Document Reviewed: 12/15/2011 °Elsevier Interactive Patient Education ©2016 Elsevier  Inc. ° °

## 2016-04-03 NOTE — ED Notes (Signed)
States is [redacted] weeks pregnant, has had anemia during this pregnancy with transfusion x 2, denies contractions, vag bleed or fluid. States has been fatigued today.

## 2016-05-09 ENCOUNTER — Inpatient Hospital Stay: Payer: Managed Care, Other (non HMO) | Admitting: Anesthesiology

## 2016-05-09 ENCOUNTER — Encounter: Payer: Self-pay | Admitting: *Deleted

## 2016-05-09 ENCOUNTER — Inpatient Hospital Stay
Admission: EM | Admit: 2016-05-09 | Discharge: 2016-05-12 | DRG: 765 | Disposition: A | Discharge status: Home / Self Care | Payer: Managed Care, Other (non HMO) | Attending: Obstetrics and Gynecology | Admitting: Obstetrics and Gynecology

## 2016-05-09 ENCOUNTER — Encounter
Admission: EM | Disposition: A | Discharge status: Home / Self Care | Payer: Self-pay | Source: Home / Self Care | Attending: Obstetrics and Gynecology

## 2016-05-09 DIAGNOSIS — O34219 Maternal care for unspecified type scar from previous cesarean delivery: Secondary | ICD-10-CM

## 2016-05-09 DIAGNOSIS — O9902 Anemia complicating childbirth: Secondary | ICD-10-CM | POA: Diagnosis present

## 2016-05-09 DIAGNOSIS — D62 Acute posthemorrhagic anemia: Secondary | ICD-10-CM | POA: Diagnosis present

## 2016-05-09 DIAGNOSIS — F329 Major depressive disorder, single episode, unspecified: Secondary | ICD-10-CM | POA: Diagnosis present

## 2016-05-09 DIAGNOSIS — O99019 Anemia complicating pregnancy, unspecified trimester: Secondary | ICD-10-CM | POA: Diagnosis present

## 2016-05-09 DIAGNOSIS — Z833 Family history of diabetes mellitus: Secondary | ICD-10-CM | POA: Diagnosis not present

## 2016-05-09 DIAGNOSIS — O0993 Supervision of high risk pregnancy, unspecified, third trimester: Secondary | ICD-10-CM

## 2016-05-09 DIAGNOSIS — Z98891 History of uterine scar from previous surgery: Secondary | ICD-10-CM

## 2016-05-09 DIAGNOSIS — Z3A36 36 weeks gestation of pregnancy: Secondary | ICD-10-CM

## 2016-05-09 DIAGNOSIS — Z8249 Family history of ischemic heart disease and other diseases of the circulatory system: Secondary | ICD-10-CM | POA: Diagnosis not present

## 2016-05-09 DIAGNOSIS — O4593 Premature separation of placenta, unspecified, third trimester: Principal | ICD-10-CM

## 2016-05-09 DIAGNOSIS — O99343 Other mental disorders complicating pregnancy, third trimester: Secondary | ICD-10-CM | POA: Diagnosis present

## 2016-05-09 DIAGNOSIS — O34211 Maternal care for low transverse scar from previous cesarean delivery: Secondary | ICD-10-CM | POA: Diagnosis present

## 2016-05-09 HISTORY — DX: Depression, unspecified: F32.A

## 2016-05-09 HISTORY — DX: Major depressive disorder, single episode, unspecified: F32.9

## 2016-05-09 LAB — URINE DRUG SCREEN, QUALITATIVE (ARMC ONLY)
Amphetamines, Ur Screen: NOT DETECTED
BARBITURATES, UR SCREEN: NOT DETECTED
BENZODIAZEPINE, UR SCRN: NOT DETECTED
Cannabinoid 50 Ng, Ur ~~LOC~~: NOT DETECTED
Cocaine Metabolite,Ur ~~LOC~~: NOT DETECTED
MDMA (Ecstasy)Ur Screen: NOT DETECTED
METHADONE SCREEN, URINE: NOT DETECTED
Opiate, Ur Screen: NOT DETECTED
Phencyclidine (PCP) Ur S: NOT DETECTED
TRICYCLIC, UR SCREEN: NOT DETECTED

## 2016-05-09 LAB — PROTIME-INR
INR: 0.92
PROTHROMBIN TIME: 12.6 s (ref 11.4–15.0)

## 2016-05-09 LAB — COMPREHENSIVE METABOLIC PANEL
ALT: 11 U/L — ABNORMAL LOW (ref 14–54)
ANION GAP: 8 (ref 5–15)
AST: 18 U/L (ref 15–41)
Albumin: 2.9 g/dL — ABNORMAL LOW (ref 3.5–5.0)
Alkaline Phosphatase: 184 U/L — ABNORMAL HIGH (ref 38–126)
BILIRUBIN TOTAL: 0.2 mg/dL — AB (ref 0.3–1.2)
BUN: 8 mg/dL (ref 6–20)
CALCIUM: 8.5 mg/dL — AB (ref 8.9–10.3)
CO2: 18 mmol/L — ABNORMAL LOW (ref 22–32)
Chloride: 108 mmol/L (ref 101–111)
Creatinine, Ser: 0.5 mg/dL (ref 0.44–1.00)
GFR calc Af Amer: >60 mL/min (ref >60)
Glucose, Bld: 85 mg/dL (ref 65–99)
POTASSIUM: 3.6 mmol/L (ref 3.5–5.1)
Sodium: 134 mmol/L — ABNORMAL LOW (ref 135–145)
TOTAL PROTEIN: 6.7 g/dL (ref 6.5–8.1)

## 2016-05-09 LAB — CBC
HEMATOCRIT: 26.4 % — AB (ref 35.0–47.0)
HEMOGLOBIN: 8.8 g/dL — AB (ref 12.0–16.0)
MCH: 21.1 pg — AB (ref 26.0–34.0)
MCHC: 33.3 g/dL (ref 32.0–36.0)
MCV: 63.3 fL — AB (ref 80.0–100.0)
Platelets: 270 10*3/uL (ref 150–440)
RBC: 4.17 MIL/uL (ref 3.80–5.20)
RDW: 17.3 % — AB (ref 11.5–14.5)
WBC: 13.8 10*3/uL — ABNORMAL HIGH (ref 3.6–11.0)

## 2016-05-09 LAB — APTT: APTT: 24 s (ref 24–36)

## 2016-05-09 LAB — KLEIHAUER-BETKE STAIN: FETAL CELLS %: 0 %

## 2016-05-09 LAB — FIBRINOGEN: Fibrinogen: 562 mg/dL — ABNORMAL HIGH (ref 210–470)

## 2016-05-09 SURGERY — Surgical Case
Anesthesia: Spinal | Site: Abdomen | Wound class: Clean Contaminated

## 2016-05-09 MED ORDER — OXYTOCIN BOLUS FROM INFUSION: 500.0000 mL | INTRAVENOUS | Status: DC

## 2016-05-09 MED ORDER — MEPERIDINE HCL 25 MG/ML IJ SOLN: 6.2500 mg | INTRAMUSCULAR | Status: DC | PRN

## 2016-05-09 MED ORDER — SERTRALINE HCL 50 MG PO TABS
50.0000 mg | ORAL_TABLET | Freq: Every day | ORAL | Status: DC
  Administered 2016-05-10 – 2016-05-12 (×3): 50 mg via ORAL
  Filled 2016-05-09 (×3): qty 1

## 2016-05-09 MED ORDER — BUPIVACAINE HCL (PF) 0.5 % IJ SOLN: 5.0000 mL | Freq: Once | INTRAMUSCULAR | Status: DC

## 2016-05-09 MED ORDER — SIMETHICONE 80 MG PO CHEW
80.0000 mg | CHEWABLE_TABLET | Freq: Three times a day (TID) | ORAL | Status: DC
  Administered 2016-05-10 – 2016-05-12 (×8): 80 mg via ORAL
  Filled 2016-05-09 (×7): qty 1

## 2016-05-09 MED ORDER — SOD CITRATE-CITRIC ACID 500-334 MG/5ML PO SOLN
30.0000 mL | ORAL | Status: AC
  Administered 2016-05-09: 30 mL via ORAL

## 2016-05-09 MED ORDER — CEFAZOLIN SODIUM-DEXTROSE 2-4 GM/100ML-% IV SOLN
INTRAVENOUS | Status: AC
  Administered 2016-05-09: 2 g via INTRAVENOUS
  Filled 2016-05-09: qty 100

## 2016-05-09 MED ORDER — DIPHENHYDRAMINE HCL 25 MG PO CAPS
25.0000 mg | ORAL_CAPSULE | Freq: Four times a day (QID) | ORAL | Status: DC | PRN
  Administered 2016-05-10: 25 mg via ORAL
  Filled 2016-05-09: qty 1

## 2016-05-09 MED ORDER — BUPIVACAINE IN DEXTROSE 0.75-8.25 % IT SOLN
INTRATHECAL | Status: DC | PRN
  Administered 2016-05-09: 1.6 mL via INTRATHECAL

## 2016-05-09 MED ORDER — KETOROLAC TROMETHAMINE 30 MG/ML IJ SOLN
30.0000 mg | Freq: Four times a day (QID) | INTRAMUSCULAR | Status: DC | PRN
  Filled 2016-05-09: qty 1

## 2016-05-09 MED ORDER — BUPIVACAINE 0.25 % ON-Q PUMP DUAL CATH 400 ML
400.0000 mL | INJECTION | Status: DC
  Filled 2016-05-09: qty 400

## 2016-05-09 MED ORDER — COCONUT OIL OIL: 1.0000 "application " | TOPICAL_OIL | Status: DC | PRN

## 2016-05-09 MED ORDER — SCOPOLAMINE 1 MG/3DAYS TD PT72: 1.0000 | MEDICATED_PATCH | Freq: Once | TRANSDERMAL | Status: DC

## 2016-05-09 MED ORDER — OXYTOCIN 40 UNITS IN LACTATED RINGERS INFUSION - SIMPLE MED: 2.5000 [IU]/h | INTRAVENOUS | Status: DC

## 2016-05-09 MED ORDER — LIDOCAINE HCL (PF) 1 % IJ SOLN: 30.0000 mL | INTRAMUSCULAR | Status: DC | PRN

## 2016-05-09 MED ORDER — IBUPROFEN 600 MG PO TABS
600.0000 mg | ORAL_TABLET | Freq: Four times a day (QID) | ORAL | Status: DC
  Administered 2016-05-10 (×4): 600 mg via ORAL
  Filled 2016-05-09 (×4): qty 1

## 2016-05-09 MED ORDER — DIBUCAINE 1 % RE OINT
1.0000 | TOPICAL_OINTMENT | RECTAL | Status: DC | PRN
Start: 2016-05-09 — End: 2016-05-12

## 2016-05-09 MED ORDER — PRENATAL MULTIVITAMIN CH
1.0000 | ORAL_TABLET | Freq: Every day | ORAL | Status: DC
  Administered 2016-05-10 – 2016-05-12 (×3): 1 via ORAL
  Filled 2016-05-09 (×3): qty 1

## 2016-05-09 MED ORDER — SOD CITRATE-CITRIC ACID 500-334 MG/5ML PO SOLN
ORAL | Status: AC
  Administered 2016-05-09: 30 mL via ORAL
  Filled 2016-05-09: qty 15

## 2016-05-09 MED ORDER — LACTATED RINGERS IV SOLN
INTRAVENOUS | Status: DC
  Administered 2016-05-10 (×2): via INTRAVENOUS

## 2016-05-09 MED ORDER — ONDANSETRON HCL 4 MG/2ML IJ SOLN: 4.0000 mg | Freq: Three times a day (TID) | INTRAMUSCULAR | Status: DC | PRN

## 2016-05-09 MED ORDER — OXYCODONE-ACETAMINOPHEN 5-325 MG PO TABS: 2.0000 | ORAL_TABLET | ORAL | Status: DC | PRN

## 2016-05-09 MED ORDER — LACTATED RINGERS IV SOLN
INTRAVENOUS | Status: DC
  Administered 2016-05-09: 18:00:00 via INTRAVENOUS

## 2016-05-09 MED ORDER — NALBUPHINE HCL 10 MG/ML IJ SOLN: 5.0000 mg | INTRAMUSCULAR | Status: DC | PRN

## 2016-05-09 MED ORDER — NALOXONE HCL 2 MG/2ML IJ SOSY: 1.0000 ug/kg/h | PREFILLED_SYRINGE | INTRAMUSCULAR | Status: DC | PRN

## 2016-05-09 MED ORDER — NALBUPHINE HCL 10 MG/ML IJ SOLN: 5.0000 mg | Freq: Once | INTRAMUSCULAR | Status: DC | PRN

## 2016-05-09 MED ORDER — SENNOSIDES-DOCUSATE SODIUM 8.6-50 MG PO TABS
2.0000 | ORAL_TABLET | ORAL | Status: DC
  Administered 2016-05-10 – 2016-05-11 (×3): 2 via ORAL
  Filled 2016-05-09 (×3): qty 2

## 2016-05-09 MED ORDER — MIDAZOLAM HCL 2 MG/2ML IJ SOLN
INTRAMUSCULAR | Status: DC | PRN
  Administered 2016-05-09: 1 mg via INTRAVENOUS

## 2016-05-09 MED ORDER — ONDANSETRON HCL 4 MG/2ML IJ SOLN: 4.0000 mg | Freq: Once | INTRAMUSCULAR | Status: DC | PRN

## 2016-05-09 MED ORDER — SODIUM CHLORIDE 0.9% FLUSH: 3.0000 mL | INTRAVENOUS | Status: DC | PRN

## 2016-05-09 MED ORDER — MENTHOL 3 MG MT LOZG: 1.0000 | LOZENGE | OROMUCOSAL | Status: DC | PRN

## 2016-05-09 MED ORDER — SOD CITRATE-CITRIC ACID 500-334 MG/5ML PO SOLN: 30.0000 mL | ORAL | Status: DC | PRN

## 2016-05-09 MED ORDER — MORPHINE SULFATE (PF) 0.5 MG/ML IJ SOLN
INTRAMUSCULAR | Status: DC | PRN
  Administered 2016-05-09: .2 mg via EPIDURAL

## 2016-05-09 MED ORDER — BETAMETHASONE SOD PHOS & ACET 6 (3-3) MG/ML IJ SUSP
INTRAMUSCULAR | Status: AC
  Administered 2016-05-09: 12 mg via INTRAMUSCULAR
  Filled 2016-05-09: qty 1

## 2016-05-09 MED ORDER — NALOXONE HCL 0.4 MG/ML IJ SOLN: 0.4000 mg | INTRAMUSCULAR | Status: DC | PRN

## 2016-05-09 MED ORDER — FENTANYL CITRATE (PF) 100 MCG/2ML IJ SOLN: 25.0000 ug | INTRAMUSCULAR | Status: DC | PRN

## 2016-05-09 MED ORDER — ONDANSETRON HCL 4 MG/2ML IJ SOLN: 4.0000 mg | Freq: Four times a day (QID) | INTRAMUSCULAR | Status: DC | PRN

## 2016-05-09 MED ORDER — BUPIVACAINE ON-Q PAIN PUMP (FOR ORDER SET NO CHG)
INJECTION | Status: DC
  Filled 2016-05-09: qty 1

## 2016-05-09 MED ORDER — BETAMETHASONE SOD PHOS & ACET 6 (3-3) MG/ML IJ SUSP
12.0000 mg | Freq: Once | INTRAMUSCULAR | Status: AC
Start: 2016-05-09 — End: 2016-05-09
  Administered 2016-05-09: 12 mg via INTRAMUSCULAR

## 2016-05-09 MED ORDER — LACTATED RINGERS IV SOLN
500.0000 mL | INTRAVENOUS | Status: DC | PRN
  Administered 2016-05-09: 19:00:00 via INTRAVENOUS

## 2016-05-09 MED ORDER — WITCH HAZEL-GLYCERIN EX PADS: 1.0000 "application " | MEDICATED_PAD | CUTANEOUS | Status: DC | PRN

## 2016-05-09 MED ORDER — CEFAZOLIN SODIUM-DEXTROSE 2-4 GM/100ML-% IV SOLN
2.0000 g | INTRAVENOUS | Status: AC
  Administered 2016-05-09: 2 g via INTRAVENOUS

## 2016-05-09 MED ORDER — OXYCODONE-ACETAMINOPHEN 5-325 MG PO TABS: 1.0000 | ORAL_TABLET | ORAL | Status: DC | PRN

## 2016-05-09 MED ORDER — PHENYLEPHRINE HCL 10 MG/ML IJ SOLN
INTRAMUSCULAR | Status: DC | PRN
  Administered 2016-05-09 (×3): 50 ug via INTRAVENOUS

## 2016-05-09 MED ORDER — VARICELLA VIRUS VACCINE LIVE 1350 PFU/0.5ML IJ SUSR: 0.5000 mL | INTRAMUSCULAR | Status: DC | PRN

## 2016-05-09 MED ORDER — KETOROLAC TROMETHAMINE 30 MG/ML IJ SOLN: 30.0000 mg | Freq: Four times a day (QID) | INTRAMUSCULAR | Status: DC | PRN

## 2016-05-09 MED ORDER — BUPIVACAINE HCL 0.5 % IJ SOLN
INTRAMUSCULAR | Status: DC | PRN
  Administered 2016-05-09: 10 mL

## 2016-05-09 MED ORDER — OXYTOCIN 10 UNIT/ML IJ SOLN: 10.0000 [IU] | Freq: Once | INTRAMUSCULAR | Status: DC

## 2016-05-09 MED ORDER — OXYTOCIN 40 UNITS IN LACTATED RINGERS INFUSION - SIMPLE MED
2.5000 [IU]/h | INTRAVENOUS | Status: DC
  Administered 2016-05-09: 499 mL via INTRAVENOUS
  Administered 2016-05-09: 1 mL via INTRAVENOUS

## 2016-05-09 MED ORDER — FERROUS SULFATE 325 (65 FE) MG PO TABS
325.0000 mg | ORAL_TABLET | Freq: Two times a day (BID) | ORAL | Status: DC
  Administered 2016-05-10 – 2016-05-12 (×6): 325 mg via ORAL
  Filled 2016-05-09 (×6): qty 1

## 2016-05-09 SURGICAL SUPPLY — 31 items
CANISTER SUCT 3000ML (MISCELLANEOUS) ×3 IMPLANT
CATH KIT ON-Q SILVERSOAK 5IN (CATHETERS) ×6 IMPLANT
CLOSURE WOUND 1/2 X4 (GAUZE/BANDAGES/DRESSINGS)
DRSG OPSITE POSTOP 4X10 (GAUZE/BANDAGES/DRESSINGS) ×3 IMPLANT
DRSG TELFA 3X8 NADH (GAUZE/BANDAGES/DRESSINGS) IMPLANT
ELECT CAUTERY BLADE 6.4 (BLADE) ×3 IMPLANT
ELECT REM PT RETURN 9FT ADLT (ELECTROSURGICAL) ×3
ELECTRODE REM PT RTRN 9FT ADLT (ELECTROSURGICAL) ×1 IMPLANT
GAUZE SPONGE 4X4 12PLY STRL (GAUZE/BANDAGES/DRESSINGS) IMPLANT
GLOVE BIO SURGEON STRL SZ7 (GLOVE) ×12 IMPLANT
GLOVE INDICATOR 7.5 STRL GRN (GLOVE) ×9 IMPLANT
GOWN STRL REUS W/ TWL LRG LVL3 (GOWN DISPOSABLE) ×3 IMPLANT
GOWN STRL REUS W/TWL LRG LVL3 (GOWN DISPOSABLE) ×6
LIQUID BAND (GAUZE/BANDAGES/DRESSINGS) ×3 IMPLANT
NS IRRIG 1000ML POUR BTL (IV SOLUTION) ×3 IMPLANT
PACK C SECTION AR (MISCELLANEOUS) ×3 IMPLANT
PAD OB MATERNITY 4.3X12.25 (PERSONAL CARE ITEMS) IMPLANT
PAD PREP 24X41 OB/GYN DISP (PERSONAL CARE ITEMS) ×3 IMPLANT
SPONGE LAP 18X18 5 PK (GAUZE/BANDAGES/DRESSINGS) IMPLANT
STRIP CLOSURE SKIN 1/2X4 (GAUZE/BANDAGES/DRESSINGS) IMPLANT
SUT CHROMIC GUT BROWN 0 54 (SUTURE) ×1 IMPLANT
SUT CHROMIC GUT BROWN 0 54IN (SUTURE) ×3
SUT MNCRL 4-0 (SUTURE) ×2
SUT MNCRL 4-0 27XMFL (SUTURE) ×1
SUT PDS AB 1 TP1 96 (SUTURE) ×3 IMPLANT
SUT PLAIN 2 0 XLH (SUTURE) ×3 IMPLANT
SUT VIC AB 0 CT1 36 (SUTURE) ×12 IMPLANT
SUT VIC AB 3-0 SH 27 (SUTURE) ×2
SUT VIC AB 3-0 SH 27X BRD (SUTURE) ×1 IMPLANT
SUTURE MNCRL 4-0 27XMF (SUTURE) ×1 IMPLANT
SWABSTK COMLB BENZOIN TINCTURE (MISCELLANEOUS) IMPLANT

## 2016-05-09 NOTE — H&P (Signed)
OB History & Physical   History of Present Illness:  Chief Complaint: vaginal bleeding  HPI:  Melissa Coffey is a 20 y.o. 21P1001 female at 5042w3d dated by an LMP consistent with an 8-week ultrasound.  Her pregnancy has been complicated by a history of cesarean section and anemia. .    She reports contractions.   She denies leakage of fluid.   She reports vaginal bleeding.   She reports fetal movement.   She presents with acute-onset vaginal bleeding that started about 25 minutes prior to arrival.  She states that the blood is soaking her clothes and continues to run down her legs.  She denies inciting events.  She was sitting at her desk at work and when she stood up she had a gush of blood.  She denies drug use and cigarette use.    Maternal Medical History:   Past Medical History  Diagnosis Date  . Anemia   . Endometriosis   . Depression     Past Surgical History  Procedure Laterality Date  . Cesarean section  02/2014    pLTCS. FITL at term  . Tonsillectomy    . Neck surgery  2003   Allergies: No Known Allergies  Prior to Admission medications   Medication Sig Start Date End Date Taking? Authorizing Provider  Prenatal Vit-Fe Fumarate-FA (PRENATAL MULTIVITAMIN) TABS tablet Take 1 tablet by mouth daily at 12 noon.   Yes Historical Provider, MD  sertraline (ZOLOFT) 50 MG tablet Take 50 mg by mouth daily.   Yes Historical Provider, MD    OB History  Gravida Para Term Preterm AB SAB TAB Ectopic Multiple Living  2 1 1       1     # Outcome Date GA Lbr Len/2nd Weight Sex Delivery Anes PTL Lv  2 Current           1 Term             Obstetric Comments  02/2014: pLTCS for FITL    Prenatal care site: [facility]  Social History: She  reports that she has never smoked. She does not have any smokeless tobacco history on file. She reports that she does not drink alcohol.  Family History: family history includes Diabetes in her maternal grandmother; Hypertension in her maternal  grandmother.   Review of Systems: Negative x 10 systems reviewed except as noted in the HPI.    Physical Exam:  Vital Signs: BP 113/72 mmHg  Pulse 89  Temp(Src) 98.3 F (36.8 C) (Oral)  Resp 20  Ht 5\' 4"  (1.626 m)  Wt 169 lb (76.658 kg)  BMI 28.99 kg/m2 General: no acute distress.  HEENT: normocephalic, atraumatic Heart: regular rate & rhythm.  No murmurs/rubs/gallops Lungs: clear to auscultation bilaterally Abdomen: soft, gravid, mildly tender to palpation between contractions (initially nontender and not becoming more tender) Pelvic: (female chaperone present during pelvic exam)  External: Normal external female genitalia  SSE: approximately 100 mL bright red blood in vaginal vault.  Cleared away with continued weeping of blood from cervix.   Cervix: Dilation: 2 / Effacement (%): 80 / Station: -2  (initially 1.5cm, 50% effacement, -3 station) Extremities: non-tender, symmetric, no edema bilaterally.  DTRs: 2+  Neurologic: Alert & oriented x 3.    Pertinent Results:  Prenatal Labs: Blood type/Rh O positive  Antibody screen negative  Rubella Immune  Varicella Not immune    RPR NR  HBsAg negative  HIV negative  GC negative  Chlamydia negative  Genetic screening Declined  1 hour GTT passed  3 hour GTT n/a  GBS unknown    Baseline FHR: 120 beats/min   Variability: moderate   Accelerations: present   Decelerations: absent Contractions: present frequency: 7-10 q 10 minutes (start of contraction every 1-1.5 minutes) Overall assessment: category 1  Bedside Ultrasound:  Number of Fetus: 1  Presentation: cephalic  Fluid: grossly normal  Placental Location: anterior.  There is an area suspicious for abruption at the level of the umbilicus.  Assessment:  Melissa Coffey is a 20 y.o. G39P1001 female at [redacted]w[redacted]d with placental abruption, history of prior cesarean section.   Plan:  1. Admit to Labor & Delivery  2. CBC, T&S, NPO, IVF 3. Abruption: continued, heavy vaginal  bleeding with cervical change.  The fetal category has been reassuring overall.  However, given her heavy bleeding, frequent contractions, worsening abdominal tenderness, anemia on admission, plan made to move to delivery.  BTMZ administered.  Discussed risks/benefits of waiting and monitoring.  At this point, believe that an emergent event may lead to significant maternal and fetal risk.  Patient agrees to move forward.  Peds aware. 4. Fetwal well-being: reassuring overall.  Conard Novak, MD 05/09/2016 7:01 PM

## 2016-05-09 NOTE — Anesthesia Preprocedure Evaluation (Signed)
Anesthesia Evaluation  Patient identified by MRN, date of birth, ID band Patient awake    Reviewed: Allergy & Precautions, NPO status , Patient's Chart, lab work & pertinent test results, reviewed documented beta blocker date and time   Airway Mallampati: II  TM Distance: >3 FB     Dental  (+) Chipped   Pulmonary           Cardiovascular      Neuro/Psych PSYCHIATRIC DISORDERS Depression    GI/Hepatic   Endo/Other    Renal/GU      Musculoskeletal   Abdominal   Peds  Hematology  (+) anemia ,   Anesthesia Other Findings Neck movement OK.  Reproductive/Obstetrics                             Anesthesia Physical Anesthesia Plan  ASA: II  Anesthesia Plan: Spinal   Post-op Pain Management:    Induction:   Airway Management Planned:   Additional Equipment:   Intra-op Plan:   Post-operative Plan:   Informed Consent: I have reviewed the patients History and Physical, chart, labs and discussed the procedure including the risks, benefits and alternatives for the proposed anesthesia with the patient or authorized representative who has indicated his/her understanding and acceptance.     Plan Discussed with: CRNA  Anesthesia Plan Comments:         Anesthesia Quick Evaluation

## 2016-05-09 NOTE — Discharge Summary (Signed)
OB Discharge Summary     Patient Name: Melissa Coffey DOB: [birth year] MRN: [id number]  Date of admission: 05/09/2016 Delivering MD: Conard Novak, MD  Date of Delivery: 05/09/2016  Date of discharge: 05/12/2016  Admitting diagnosis:  Patient Active Problem List   Diagnosis Date Noted  . Supervision of high risk pregnancy in third trimester 05/09/2016  . [redacted] weeks gestation of pregnancy 05/09/2016  . Previous cesarean delivery, antepartum condition or complication 05/09/2016  . Placental abruption in third trimester 05/09/2016  . Anemia in pregnancy 11/11/2015   Intrauterine pregnancy: [redacted]w[redacted]d      Secondary diagnosis: Anemia and None     Discharge diagnosis: Preterm Pregnancy Delivered and Anemia                                                                                                Post partum procedures:blood transfusion  Augmentation: n/a  Complications: Placental Abruption  Hospital course:  Onset of Labor With Unplanned C/S  20 y.o. yo G2P1001 at [redacted]w[redacted]d was admitted in Latent Labor on 05/09/2016. Patient had a labor course significant for placental abruption.  She presented with a large amount of active vaginal bleeding.  She was contracting essentially every minute with worsening abdominal pain and tenderness.  She made change from 1.5cm to 2cm with worsening pain. So, she was taken to the operating room for cesarean delivery.  The patient went for cesarean section due to history of prior cesarean delivery and placental abruption, and delivered a Viable infant,05/09/2016  Details of operation can be found in separate operative note. Patient had an uncomplicated postpartum course.  She is ambulating,tolerating a regular diet, passing flatus, and urinating well.  Patient is discharged home in stable condition 05/12/2016.  Physical exam  Filed Vitals:   05/11/16 2330 05/12/16 0335 05/12/16 0745 05/12/16 1147  BP:   120/68 106/77  Pulse:   78 80  Temp: 98.2 F (36.8 C)  98.1 F (36.7 C) 98.7 F (37.1 C) 98.7 F (37.1 C)  TempSrc: Oral Oral Oral Oral  Resp:      Height:      Weight:      SpO2:       General: alert, cooperative and no distress Lochia: appropriate Uterine Fundus: firm Incision: Healing well with no significant drainage, Dressing is clean, dry, and intact DVT Evaluation: No evidence of DVT seen on physical exam.  Labs: Lab Results  Component Value Date   WBC 17.0* 05/11/2016   HGB 7.9* 05/11/2016   HCT 25.4* 05/11/2016   MCV 65.8* 05/11/2016   PLT 227 05/11/2016   CMP Latest Ref Rng 05/09/2016  Glucose 65 - 99 mg/dL 85  BUN 6 - 20 mg/dL 8  Creatinine 4.09 - 8.11 mg/dL 9.14  Sodium 782 - 956 mmol/L 134(L)  Potassium 3.5 - 5.1 mmol/L 3.6  Chloride 101 - 111 mmol/L 108  CO2 22 - 32 mmol/L 18(L)  Calcium 8.9 - 10.3 mg/dL 2.1(H)  Total Protein 6.5 - 8.1 g/dL 6.7  Total Bilirubin 0.3 - 1.2 mg/dL 0.8(M)  Alkaline Phos 38 - 126 U/L 184(H)  AST 15 -  41 U/L 18  ALT 14 - 54 U/L 11(L)    Discharge instruction: per After Visit Summary.  Medications:    Medication List    TAKE these medications        ferrous sulfate 325 (65 FE) MG tablet  Take 325 mg by mouth 2 (two) times daily with a meal.     ibuprofen 600 MG tablet  Commonly known as:  ADVIL,MOTRIN  Take 1 tablet (600 mg total) by mouth every 6 (six) hours.     oxyCODONE-acetaminophen 5-325 MG tablet  Commonly known as:  PERCOCET/ROXICET  Take 2 tablets by mouth every 4 (four) hours as needed (pain scale > 7).     prenatal multivitamin Tabs tablet  Take 1 tablet by mouth daily at 12 noon.     sertraline 50 MG tablet  Commonly known as:  ZOLOFT  Take 50 mg by mouth daily.        Diet: routine diet  Activity: Advance as tolerated. Pelvic rest for 6 weeks.   Outpatient follow up: Follow-up Information    Follow up with Conard NovakJackson, Mazelle Huebert D, MD In 1 week.   Specialty:  Obstetrics and Gynecology   Why:  incision check/depression check   Contact information:    [address] [place] [address] [phone]         Postpartum contraception: Nexplanon Rhogam Given postpartum: N/A Rubella vaccine given postpartum: no Varicella vaccine given postpartum: yes TDaP given antepartum or postpartum: antepartum  Newborn Data: Live born female  Birth Weight: 5 lb 14.5 oz (2680 g) APGAR: 8, 9  Baby Feeding: Bottle  Disposition:home with mother  SIGNED: Thomasene MohairStephen Eural Holzschuh, MD 05/12/2016 2:18 PM

## 2016-05-09 NOTE — Transfer of Care (Signed)
Immediate Anesthesia Transfer of Care Note  Patient: Melissa Coffey  Procedure(s) Performed: Procedure(s): CESAREAN SECTION (N/A)  Patient Location: PACU  Anesthesia Type:Spinal  Level of Consciousness: awake, alert , oriented and patient cooperative  Airway & Oxygen Therapy: Patient Spontanous Breathing  Post-op Assessment: Report given to RN and Post -op Vital signs reviewed and stable  Post vital signs: Reviewed and stable  Last Vitals:  Filed Vitals:   05/09/16 1706 05/09/16 1902  BP: 113/72 109/65  Pulse: 89 88  Temp: 36.8 C 36.4 C  Resp: 20 19    Last Pain:  Filed Vitals:   05/09/16 1926  PainSc: 6          Complications: No apparent anesthesia complications

## 2016-05-09 NOTE — Anesthesia Procedure Notes (Signed)
Spinal Patient location during procedure: OR Staffing Anesthesiologist: Berdine AddisonHOMAS, Melissa Coffey Performed by: anesthesiologist  Preanesthetic Checklist Completed: patient identified, site marked, surgical consent, pre-op evaluation, timeout performed, IV checked and risks and benefits discussed Spinal Block Patient position: sitting Prep: Betadine Patient monitoring: heart rate, cardiac monitor, continuous pulse ox and blood pressure Approach: midline Location: L3-4 Injection technique: single-shot Needle Needle type: Pencil-Tip  Needle gauge: 25 G Needle length: 9 cm Assessment Sensory level: T10 Additional Notes 1927 marcaine 1.366ml and astro 0.2mg .

## 2016-05-09 NOTE — Op Note (Signed)
Cesarean Section Operative Note    Melissa Coffey   05/09/2016   Pre-operative Diagnosis:  1) intrauterine pregnancy at 1924w3d  2) history of cesarean delivery, desires repeat 3) placental abruption  Post-operative Diagnosis:  1) intrauterine pregnancy at 6424w3d  2) history of cesarean delivery, desires repeat 3) placental abruption   Procedure: Repeat Low Transverse Cesarean Section  Surgeon: Surgeon(s) and Role:    * Conard NovakStephen D Deroy Noah, MD - Primary   [facility]  Anesthesia: spinal   Findings:  1) normal appearing gravid uterus, fallopian tubes, and ovaries 2) large clots upon entry into uterine cavity 3) viable female ("Sophia") infant with APGARs 8 at 1 minute and 9 at 5 minutes, weight 2,680 grams   Estimated Blood Loss: 900 mL  Total IV Fluids: 1,450 mL     Urine Output: 500 mL Clear urine at end of procedure  Specimens: Placenta to path for permanent  Complications: no complications  Disposition: PACU - hemodynamically stable.   Maternal Condition: stable   Baby condition / location:  Nursery  Procedure Details:  The patient was seen in the Holding Room. The risks, benefits, complications, treatment options, and expected outcomes were discussed with the patient. The patient concurred with the proposed plan, giving informed consent. identified as Melissa Coffey and the procedure verified as C-Section Delivery. A Time Out was held and the above information confirmed.   After induction of anesthesia, the patient was draped and prepped in the usual sterile manner. A Pfannenstiel incision was made and carried down through the subcutaneous tissue to the fascia. Fascial incision was made and extended transversely. The fascia was separated from the underlying rectus tissue superiorly and inferiorly. The peritoneum was identified and entered. Peritoneal incision was extended longitudinally. The bladder flap was sharply freed from the lower uterine segment. A  low transverse uterine incision was made and the hysterotomy was extended with cranial-caudal tension. Noted were several large (~10cm) clots from the uterine cavity.   Rupture of membranes for clear fluid with an Allis clamp.  Delivered from cephalic presentation was a 2,680 gram Living newborn infant(s) or Female with Apgar scores of 8 at one minute and 9 at five minutes. Cord ph was not sent the umbilical cord was clamped and cut cord blood was obtained for evaluation. The placenta was removed Intact and appeared normal. The uterine outline, tubes and ovaries appeared normal. The uterine incision was closed with running locked sutures of 0 Vicryl.  A second layer of the same suture was thrown in an imbricating fashion.  Hemostasis was assured.  The uterus was returned to the abdomen and the paracolic gutters were cleared of all clots and debris.  The rectus muscles were inspected and found to be hemostatic.  The On-Q catheter pumps were inserted in accordance with the manufacturer's recommendations.  The catheters were inserted approximately 4cm cephelad to the incision line, approximately 1cm apart, straddling the midline.  They were inserted to a depth of the 4th mark. They were positioned superficial to the rectus abdominus muscles and deep to the rectus fascia.    The fascia was then reapproximated with running sutures of 1-0 PDS, looped. Three interrupted 3-0 vicryl sutures were placed in the subcutaneous tissue to reinforce the skin closure.The subcuticular closure was performed using 4-0 monocryl. The skin closure was reinforced using surgical skin glue.  The On-Q catheters were bolused with 5 mL of 0.5% marcaine plain for a total of 10 mL.  The catheters were affixed  to the skin with surgical skin glue, steri-strips, and tegaderm.    Instrument, sponge, and needle counts were correct prior the abdominal closure and were correct at the conclusion of the case.  The patient received Ancef 2 gram IV  prior to skin incision (within 30 minutes). For VTE prophylaxis she was wearing SCDs throughout the case.  The vagina was prepped with betadine prior to beginning the case.  Signed: Conard Novak, MD 05/09/2016 8:39 PM

## 2016-05-10 ENCOUNTER — Encounter: Payer: Self-pay | Admitting: Obstetrics and Gynecology

## 2016-05-10 LAB — CBC
HCT: 22.8 % — ABNORMAL LOW (ref 35.0–47.0)
HEMOGLOBIN: 7.1 g/dL — AB (ref 12.0–16.0)
MCH: 20.1 pg — AB (ref 26.0–34.0)
MCHC: 31.1 g/dL — ABNORMAL LOW (ref 32.0–36.0)
MCV: 64.8 fL — ABNORMAL LOW (ref 80.0–100.0)
Platelets: 275 10*3/uL (ref 150–440)
RBC: 3.53 MIL/uL — AB (ref 3.80–5.20)
RDW: 17.1 % — ABNORMAL HIGH (ref 11.5–14.5)
WBC: 21.6 10*3/uL — ABNORMAL HIGH (ref 3.6–11.0)

## 2016-05-10 LAB — RPR: RPR: NONREACTIVE

## 2016-05-10 MED ORDER — DOCUSATE SODIUM 100 MG PO CAPS
100.0000 mg | ORAL_CAPSULE | Freq: Every day | ORAL | Status: DC
  Administered 2016-05-10 – 2016-05-12 (×3): 100 mg via ORAL
  Filled 2016-05-10 (×3): qty 1

## 2016-05-10 MED ORDER — SODIUM CHLORIDE 0.9% FLUSH
3.0000 mL | Freq: Four times a day (QID) | INTRAVENOUS | Status: DC
  Administered 2016-05-11 (×2): 3 mL via INTRAVENOUS

## 2016-05-10 MED ORDER — SODIUM CHLORIDE 0.9% FLUSH
3.0000 mL | Freq: Four times a day (QID) | INTRAVENOUS | Status: DC
  Administered 2016-05-10: 3 mL via INTRAVENOUS

## 2016-05-10 MED ORDER — IBUPROFEN 600 MG PO TABS
600.0000 mg | ORAL_TABLET | Freq: Four times a day (QID) | ORAL | Status: DC
  Administered 2016-05-11 (×4): 600 mg via ORAL
  Filled 2016-05-10 (×5): qty 1

## 2016-05-10 NOTE — Anesthesia Postprocedure Evaluation (Signed)
Anesthesia Post Note  Patient: Melissa RutherfordHarley R Coffey  Procedure(s) Performed: Procedure(s) (LRB): CESAREAN SECTION (N/A)  Patient location during evaluation: Mother Baby Anesthesia Type: Spinal Level of consciousness: awake, awake and alert and oriented Pain management: pain level controlled Vital Signs Assessment: post-procedure vital signs reviewed and stable Respiratory status: spontaneous breathing, nonlabored ventilation and respiratory function stable Cardiovascular status: blood pressure returned to baseline and stable Postop Assessment: no headache, no backache, no signs of nausea or vomiting and adequate PO intake Anesthetic complications: no    Last Vitals:  Filed Vitals:   05/10/16 0207 05/10/16 0414  BP: 103/62 100/50  Pulse: 76 75  Temp: 37.1 C 37.1 C  Resp: 18 18    Last Pain:  Filed Vitals:   05/10/16 0449  PainSc: 0-No pain                 Ginger CarneStephanie Leiani Enright

## 2016-05-10 NOTE — Anesthesia Post-op Follow-up Note (Signed)
  Anesthesia Pain Follow-up Note  Patient: Melissa RutherfordHarley R Coffey  Day #: 1  Date of Follow-up: 05/10/2016 Time: 7:28 AM  Last Vitals:  Filed Vitals:   05/10/16 0207 05/10/16 0414  BP: 103/62 100/50  Pulse: 76 75  Temp: 37.1 C 37.1 C  Resp: 18 18    Level of Consciousness: alert  Pain: none   Side Effects:None  Catheter Site Exam:clean, dry     Plan: D/C from anesthesia care  [facility] LP[facility]phanie Luian Schumpert

## 2016-05-10 NOTE — Progress Notes (Signed)
POD #1 Repeat CS for placental abruption at 36wk4d Subjective:  Feels well. No pain. Bottle feeding. Tolerating regular diet. No lightheadedness or headache when sitting up.   Objective:  Blood pressure 103/64, pulse 73, temperature 98.3 F (36.8 C), temperature source Oral, resp. rate 20, height '5\' 4"'  (1.626 m), weight 76.658 kg (169 lb), SpO2 100 %. Urine output: 1200 ml since surgery, urine clear, straw yellow General: NAD, pale Heart: RRR without murmur Pulmonary: no increased work of breathing/ CTA Abdomen: non-distended, non-tender, fundus firm at level of umbilicus-1FB, BS active Incision: honey comb dressing C+D+I, ON Q intact Extremities:SCDs on  Results for orders placed or performed during the hospital encounter of 05/09/16 (from the past 72 hour(s))  CBC     Status: Abnormal   Collection Time: 05/09/16  5:46 PM  Result Value Ref Range   WBC 13.8 (H) 3.6 - 11.0 K/uL   RBC 4.17 3.80 - 5.20 MIL/uL   Hemoglobin 8.8 (L) 12.0 - 16.0 g/dL   HCT 26.4 (L) 35.0 - 47.0 %   MCV 63.3 (L) 80.0 - 100.0 fL   MCH 21.1 (L) 26.0 - 34.0 pg   MCHC 33.3 32.0 - 36.0 g/dL   RDW 17.3 (H) 11.5 - 14.5 %   Platelets 270 150 - 440 K/uL  Type and screen [place]     Status: None   Collection Time: 05/09/16  5:46 PM  Result Value Ref Range   ABO/RH(D) O POS    Antibody Screen NEG    Sample Expiration 05/12/2016   RPR     Status: None   Collection Time: 05/09/16  5:46 PM  Result Value Ref Range   RPR Ser Ql Non Reactive Non Reactive    Comment: (NOTE) Performed At: [facility] [address] [place], [place] [id number] Lindon Romp MD IH:[id number]   Urine Drug Screen, Qualitative ([place] only)     Status: None   Collection Time: 05/09/16  5:46 PM  Result Value Ref Range   Tricyclic, Ur Screen NONE DETECTED NONE DETECTED   Amphetamines, Ur Screen NONE DETECTED NONE DETECTED   MDMA (Ecstasy)Ur Screen NONE DETECTED NONE DETECTED   Cocaine Metabolite,Ur  [place] NONE DETECTED NONE DETECTED   Opiate, Ur Screen NONE DETECTED NONE DETECTED   Phencyclidine (PCP) Ur S NONE DETECTED NONE DETECTED   Cannabinoid 50 Ng, Ur [place] NONE DETECTED NONE DETECTED   Barbiturates, Ur Screen NONE DETECTED NONE DETECTED   Benzodiazepine, Ur Scrn NONE DETECTED NONE DETECTED   Methadone Scn, Ur NONE DETECTED NONE DETECTED    Comment: (NOTE) 756  Tricyclics, urine               Cutoff 1000 ng/mL 200  Amphetamines, urine             Cutoff 1000 ng/mL 300  MDMA (Ecstasy), urine           Cutoff 500 ng/mL 400  Cocaine Metabolite, urine       Cutoff 300 ng/mL 500  Opiate, urine                   Cutoff 300 ng/mL 600  Phencyclidine (PCP), urine      Cutoff 25 ng/mL 700  Cannabinoid, urine              Cutoff 50 ng/mL 800  Barbiturates, urine             Cutoff 200 ng/mL 900  Benzodiazepine, urine  Cutoff 200 ng/mL 1000 Methadone, urine                Cutoff 300 ng/mL 1100 1200 The urine drug screen provides only a preliminary, unconfirmed 1300 analytical test result and should not be used for non-medical 1400 purposes. Clinical consideration and professional judgment should 1500 be applied to any positive drug screen result due to possible 1600 interfering substances. A more specific alternate chemical method 1700 must be used in order to obtain a confirmed analytical result.  1800 Gas chromato graphy / mass spectrometry (GC/MS) is the preferred 1900 confirmatory method.   Protime-INR     Status: None   Collection Time: 05/09/16  5:46 PM  Result Value Ref Range   Prothrombin Time 12.6 11.4 - 15.0 seconds   INR 0.92   APTT     Status: None   Collection Time: 05/09/16  5:46 PM  Result Value Ref Range   aPTT 24 24 - 36 seconds  Fibrinogen     Status: Abnormal   Collection Time: 05/09/16  5:46 PM  Result Value Ref Range   Fibrinogen 562 (H) 210 - 470 mg/dL  Comprehensive metabolic panel     Status: Abnormal   Collection Time: 05/09/16  5:46 PM  Result  Value Ref Range   Sodium 134 (L) 135 - 145 mmol/L   Potassium 3.6 3.5 - 5.1 mmol/L   Chloride 108 101 - 111 mmol/L   CO2 18 (L) 22 - 32 mmol/L   Glucose, Bld 85 65 - 99 mg/dL   BUN 8 6 - 20 mg/dL   Creatinine, Ser 0.50 0.44 - 1.00 mg/dL   Calcium 8.5 (L) 8.9 - 10.3 mg/dL   Total Protein 6.7 6.5 - 8.1 g/dL   Albumin 2.9 (L) 3.5 - 5.0 g/dL   AST 18 15 - 41 U/L   ALT 11 (L) 14 - 54 U/L   Alkaline Phosphatase 184 (H) 38 - 126 U/L   Total Bilirubin 0.2 (L) 0.3 - 1.2 mg/dL   GFR calc non Af Amer >60 >60 mL/min   GFR calc Af Amer >60 >60 mL/min    Comment: (NOTE) The eGFR has been calculated using the CKD EPI equation. This calculation has not been validated in all clinical situations. eGFR's persistently <60 mL/min signify possible Chronic Kidney Disease.    Anion gap 8 5 - 15  Kleihauer-Betke stain     Status: None   Collection Time: 05/09/16  5:46 PM  Result Value Ref Range   Fetal Cells % 0 %   Quantitation Fetal Hemoglobin 0ML mL   # Vials RhIg NOT INDICATED     Comment: CONFIRMED BY MSS   CBC     Status: Abnormal   Collection Time: 05/10/16  6:28 AM  Result Value Ref Range   WBC 21.6 (H) 3.6 - 11.0 K/uL   RBC 3.53 (L) 3.80 - 5.20 MIL/uL   Hemoglobin 7.1 (L) 12.0 - 16.0 g/dL   HCT 22.8 (L) 35.0 - 47.0 %   MCV 64.8 (L) 80.0 - 100.0 fL   MCH 20.1 (L) 26.0 - 34.0 pg   MCHC 31.1 (L) 32.0 - 36.0 g/dL   RDW 17.1 (H) 11.5 - 14.5 %   Platelets 275 150 - 440 K/uL     Assessment:   20 y.o. G2P1001 postoperativeday # 1 s/p abruption  Chronic anemia with worsening due to acute blood loss from abruption, good urinary output, stable vital signs   Iron and vitamin supplements  DC foley this afternoon   Keep IV going until she is asymptomatic of anemia when OOB and ambulating this afternoon then saline lock   Repeat CBC in Am.      Plan:   1) --/--/O POS (07/11 1746) Charlynn Grimes Immune (01/13 0000) / Varicella nonimmune  2) TDAP UTD   3)Bottle feeding/ Contraception:  ?Nexplanon  4) Disposition- possibly POD #3 or 4  Emma Birchler, CNM

## 2016-05-11 LAB — SURGICAL PATHOLOGY

## 2016-05-11 LAB — CBC
HEMATOCRIT: 20.8 % — AB (ref 35.0–47.0)
Hemoglobin: 6.4 g/dL — ABNORMAL LOW (ref 12.0–16.0)
MCH: 20.1 pg — ABNORMAL LOW (ref 26.0–34.0)
MCHC: 30.6 g/dL — AB (ref 32.0–36.0)
MCV: 65.8 fL — ABNORMAL LOW (ref 80.0–100.0)
Platelets: 227 10*3/uL (ref 150–440)
RBC: 3.16 MIL/uL — ABNORMAL LOW (ref 3.80–5.20)
RDW: 17.4 % — AB (ref 11.5–14.5)
WBC: 17 10*3/uL — ABNORMAL HIGH (ref 3.6–11.0)

## 2016-05-11 LAB — HEMOGLOBIN AND HEMATOCRIT, BLOOD
HCT: 25.4 % — ABNORMAL LOW (ref 35.0–47.0)
Hemoglobin: 7.9 g/dL — ABNORMAL LOW (ref 12.0–16.0)

## 2016-05-11 LAB — PREPARE RBC (CROSSMATCH)

## 2016-05-11 MED ORDER — SODIUM CHLORIDE 0.9 % IV SOLN
Freq: Once | INTRAVENOUS | Status: AC
  Administered 2016-05-11: 12:00:00 via INTRAVENOUS

## 2016-05-11 MED ORDER — DIPHENHYDRAMINE HCL 25 MG PO CAPS
25.0000 mg | ORAL_CAPSULE | Freq: Once | ORAL | Status: AC
  Administered 2016-05-11: 25 mg via ORAL
  Filled 2016-05-11: qty 1

## 2016-05-11 MED ORDER — ACETAMINOPHEN 325 MG PO TABS
650.0000 mg | ORAL_TABLET | Freq: Once | ORAL | Status: AC
  Administered 2016-05-11: 650 mg via ORAL
  Filled 2016-05-11: qty 2

## 2016-05-11 NOTE — Progress Notes (Addendum)
Post Op Day 2 Subjective:   Pt feels well. She is tolerating PO intake and her pain is well controlled with PO meds. She is ambulating and voiding without difficulty. She denies headache or feeling lightheaded when up and ambulating. Discussed transfusing 1 or 2 units of blood with pt due to low H and H. She is agreeable. Will consult with Dr Bonney AidStaebler for recommendation of number of units to transfuse.  Objective:  Blood pressure 105/66, pulse 60, temperature 98.4 F (36.9 C), temperature source Oral, resp. rate 18, height 5\' 4"  (1.626 m), weight 76.658 kg (169 lb), SpO2 99 %.  General: NAD, stable vital signs, pale color of skin Pulmonary: no increased work of breathing, lungs B CTA Cardiac: Regular rate and rhythm  Abdomen: non-distended, non-tender, fundus firm at level of umbilicus Incision: C/D/I honeycomb dressing, no s/s of infection, On Q pump intact Extremities: no edema, no erythema, no tenderness  Results for orders placed or performed during the hospital encounter of 05/09/16 (from the past 24 hour(s))  CBC     Status: Abnormal   Collection Time: 05/11/16  5:39 AM  Result Value Ref Range   WBC 17.0 (H) 3.6 - 11.0 K/uL   RBC 3.16 (L) 3.80 - 5.20 MIL/uL   Hemoglobin 6.4 (L) 12.0 - 16.0 g/dL   HCT 16.120.8 (L) 09.635.0 - 04.547.0 %   MCV 65.8 (L) 80.0 - 100.0 fL   MCH 20.1 (L) 26.0 - 34.0 pg   MCHC 30.6 (L) 32.0 - 36.0 g/dL   RDW 40.917.4 (H) 81.111.5 - 91.414.5 %   Platelets 227 150 - 440 K/uL   @I /O24@   Assessment:   20 y.o. G2P1001 postoperativeday # 2   Plan:  1) Acute blood loss anemia - hemodynamically stable and asymptomatic except for color of skin     - Transfuse 1 unit PRBC, recheck H/H, transfuse 2nd unit if needed  2) O+, Rubella Immune, Varicella Non Immune  3) TDAP UTD  4) Bottle/Contraception: considering Nexplanon- wants to discuss with boyfriend prior to decision  5) Disposition: Home Day 3 or 4 depending on progress   Elber Galyean, CNM

## 2016-05-12 ENCOUNTER — Encounter: Payer: Self-pay | Admitting: General Practice

## 2016-05-12 LAB — TYPE AND SCREEN
ABO/RH(D): O POS
Antibody Screen: NEGATIVE
UNIT DIVISION: 0

## 2016-05-12 MED ORDER — OXYCODONE-ACETAMINOPHEN 5-325 MG PO TABS: 2.0000 | ORAL_TABLET | ORAL | Status: DC | PRN

## 2016-05-12 MED ORDER — IBUPROFEN 600 MG PO TABS: 600.0000 mg | ORAL_TABLET | Freq: Four times a day (QID) | ORAL | Status: DC

## 2016-05-12 MED ORDER — IBUPROFEN 600 MG PO TABS
600.0000 mg | ORAL_TABLET | Freq: Four times a day (QID) | ORAL | Status: DC
  Administered 2016-05-12 (×3): 600 mg via ORAL
  Filled 2016-05-12 (×2): qty 1

## 2016-05-29 ENCOUNTER — Other Ambulatory Visit: Payer: Managed Care, Other (non HMO)

## 2016-05-30 ENCOUNTER — Encounter: Admission: RE | Payer: Self-pay | Source: Ambulatory Visit

## 2016-05-30 ENCOUNTER — Inpatient Hospital Stay
Admission: RE | Admit: 2016-05-30 | Payer: Managed Care, Other (non HMO) | Source: Ambulatory Visit | Admitting: Obstetrics and Gynecology

## 2016-05-30 SURGERY — Surgical Case
Anesthesia: Choice

## 2016-09-01 ENCOUNTER — Emergency Department: Payer: No Typology Code available for payment source

## 2016-09-01 ENCOUNTER — Emergency Department
Admission: EM | Admit: 2016-09-01 | Discharge: 2016-09-01 | Disposition: A | Discharge status: Home / Self Care | Payer: No Typology Code available for payment source | Attending: Emergency Medicine | Admitting: Emergency Medicine

## 2016-09-01 ENCOUNTER — Encounter: Payer: Self-pay | Admitting: Emergency Medicine

## 2016-09-01 DIAGNOSIS — Y999 Unspecified external cause status: Secondary | ICD-10-CM | POA: Insufficient documentation

## 2016-09-01 DIAGNOSIS — S161XXA Strain of muscle, fascia and tendon at neck level, initial encounter: Secondary | ICD-10-CM | POA: Insufficient documentation

## 2016-09-01 DIAGNOSIS — Y92410 Unspecified street and highway as the place of occurrence of the external cause: Secondary | ICD-10-CM | POA: Insufficient documentation

## 2016-09-01 DIAGNOSIS — Y9389 Activity, other specified: Secondary | ICD-10-CM | POA: Diagnosis not present

## 2016-09-01 DIAGNOSIS — M791 Myalgia: Secondary | ICD-10-CM | POA: Diagnosis not present

## 2016-09-01 DIAGNOSIS — Z79899 Other long term (current) drug therapy: Secondary | ICD-10-CM | POA: Insufficient documentation

## 2016-09-01 DIAGNOSIS — S199XXA Unspecified injury of neck, initial encounter: Secondary | ICD-10-CM | POA: Diagnosis present

## 2016-09-01 DIAGNOSIS — M7918 Myalgia, other site: Secondary | ICD-10-CM

## 2016-09-01 MED ORDER — TRAMADOL HCL 50 MG PO TABS
50.0000 mg | ORAL_TABLET | Freq: Once | ORAL | Status: AC
  Administered 2016-09-01: 50 mg via ORAL
  Filled 2016-09-01: qty 1

## 2016-09-01 MED ORDER — CYCLOBENZAPRINE HCL 10 MG PO TABS
10.0000 mg | ORAL_TABLET | Freq: Once | ORAL | Status: AC
  Administered 2016-09-01: 10 mg via ORAL
  Filled 2016-09-01: qty 1

## 2016-09-01 MED ORDER — IBUPROFEN 600 MG PO TABS: 600.0000 mg | ORAL_TABLET | Freq: Three times a day (TID) | ORAL | 0 refills | Status: DC | PRN

## 2016-09-01 MED ORDER — CYCLOBENZAPRINE HCL 10 MG PO TABS: 10.0000 mg | ORAL_TABLET | Freq: Three times a day (TID) | ORAL | 0 refills | Status: DC | PRN

## 2016-09-01 MED ORDER — TRAMADOL HCL 50 MG PO TABS: 50.0000 mg | ORAL_TABLET | Freq: Two times a day (BID) | ORAL | 0 refills | Status: DC | PRN
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
## 2016-11-21 ENCOUNTER — Emergency Department
Admission: EM | Admit: 2016-11-21 | Discharge: 2016-11-21 | Disposition: A | Discharge status: Home / Self Care | Payer: Managed Care, Other (non HMO) | Attending: Emergency Medicine | Admitting: Emergency Medicine

## 2016-11-21 ENCOUNTER — Encounter: Payer: Self-pay | Admitting: Emergency Medicine

## 2016-11-21 DIAGNOSIS — N760 Acute vaginitis: Secondary | ICD-10-CM | POA: Diagnosis not present

## 2016-11-21 DIAGNOSIS — Z791 Long term (current) use of non-steroidal anti-inflammatories (NSAID): Secondary | ICD-10-CM | POA: Diagnosis not present

## 2016-11-21 DIAGNOSIS — Z79899 Other long term (current) drug therapy: Secondary | ICD-10-CM | POA: Insufficient documentation

## 2016-11-21 DIAGNOSIS — A084 Viral intestinal infection, unspecified: Secondary | ICD-10-CM | POA: Diagnosis not present

## 2016-11-21 DIAGNOSIS — K2970 Gastritis, unspecified, without bleeding: Secondary | ICD-10-CM

## 2016-11-21 DIAGNOSIS — R1084 Generalized abdominal pain: Secondary | ICD-10-CM | POA: Diagnosis present

## 2016-11-21 DIAGNOSIS — B9689 Other specified bacterial agents as the cause of diseases classified elsewhere: Secondary | ICD-10-CM

## 2016-11-21 LAB — CBC
HCT: 28.3 % — ABNORMAL LOW (ref 35.0–47.0)
Hemoglobin: 9 g/dL — ABNORMAL LOW (ref 12.0–16.0)
MCH: 19.9 pg — ABNORMAL LOW (ref 26.0–34.0)
MCHC: 31.9 g/dL — ABNORMAL LOW (ref 32.0–36.0)
MCV: 62.4 fL — AB (ref 80.0–100.0)
PLATELETS: 487 10*3/uL — AB (ref 150–440)
RBC: 4.54 MIL/uL (ref 3.80–5.20)
RDW: 19.1 % — ABNORMAL HIGH (ref 11.5–14.5)
WBC: 17.4 10*3/uL — AB (ref 3.6–11.0)

## 2016-11-21 LAB — WET PREP, GENITAL
SPERM: NONE SEEN
Trich, Wet Prep: NONE SEEN
YEAST WET PREP: NONE SEEN

## 2016-11-21 LAB — BASIC METABOLIC PANEL
Anion gap: 9 (ref 5–15)
BUN: 16 mg/dL (ref 6–20)
CHLORIDE: 106 mmol/L (ref 101–111)
CO2: 20 mmol/L — ABNORMAL LOW (ref 22–32)
CREATININE: 0.52 mg/dL (ref 0.44–1.00)
Calcium: 8.9 mg/dL (ref 8.9–10.3)
GFR calc Af Amer: >60 mL/min (ref >60)
GFR calc non Af Amer: >60 mL/min (ref >60)
Glucose, Bld: 126 mg/dL — ABNORMAL HIGH (ref 65–99)
Potassium: 3.9 mmol/L (ref 3.5–5.1)
SODIUM: 135 mmol/L (ref 135–145)

## 2016-11-21 LAB — CHLAMYDIA/NGC RT PCR (ARMC ONLY)
Chlamydia Tr: NOT DETECTED
N GONORRHOEAE: NOT DETECTED

## 2016-11-21 MED ORDER — ONDANSETRON HCL 4 MG/2ML IJ SOLN
4.0000 mg | Freq: Once | INTRAMUSCULAR | Status: AC
  Administered 2016-11-21: 4 mg via INTRAVENOUS
  Filled 2016-11-21: qty 2

## 2016-11-21 MED ORDER — SODIUM CHLORIDE 0.9 % IV BOLUS (SEPSIS)
1000.0000 mL | Freq: Once | INTRAVENOUS | Status: AC
  Administered 2016-11-21: 1000 mL via INTRAVENOUS

## 2016-11-21 MED ORDER — ONDANSETRON HCL 4 MG/2ML IJ SOLN
INTRAMUSCULAR | Status: AC
  Administered 2016-11-21: 4 mg via INTRAVENOUS
  Filled 2016-11-21: qty 2

## 2016-11-21 MED ORDER — METRONIDAZOLE 500 MG PO TABS: 500.0000 mg | ORAL_TABLET | Freq: Two times a day (BID) | ORAL | 0 refills | Status: DC

## 2016-11-21 MED ORDER — SODIUM CHLORIDE 0.9 % IV SOLN
1000.0000 mL | Freq: Once | INTRAVENOUS | Status: AC
  Administered 2016-11-21: 1000 mL via INTRAVENOUS

## 2016-11-21 MED ORDER — ONDANSETRON 4 MG PO TBDP: 4.0000 mg | ORAL_TABLET | Freq: Three times a day (TID) | ORAL | 0 refills | Status: DC | PRN

## 2016-11-21 MED ORDER — ONDANSETRON HCL 4 MG/2ML IJ SOLN
4.0000 mg | Freq: Once | INTRAMUSCULAR | Status: AC
  Administered 2016-11-21: 4 mg via INTRAVENOUS

## 2016-11-21 NOTE — ED Notes (Signed)
Pt reports nausea is returning. EDP notified.

## 2016-11-21 NOTE — ED Provider Notes (Signed)
[facility] Emergency Department Provider Note   ____________________________________________    I have reviewed the triage vital signs and the nursing notes.   HISTORY  Chief Complaint Emesis; Chills; and Chest Pain     HPI Melissa Coffey is a 21 y.o. female who presents with complaints of diffuse intermittent abdominal cramping, nausea and vomiting. She reports symptoms started 1 day ago and has been persistent. She reports she feels dehydrated. Subjective fevers. No sick contacts reported. No recent travel. Additionally she reports that she has had vaginal discharge over the last2 weeks, no new sexual partners  Past Medical History:  Diagnosis Date  . Anemia   . Depression   . Endometriosis     Patient Active Problem List   Diagnosis Date Noted  . Supervision of high risk pregnancy in third trimester 05/09/2016  . [redacted] weeks gestation of pregnancy 05/09/2016  . Previous cesarean delivery, antepartum condition or complication 05/09/2016  . Placental abruption in third trimester 05/09/2016  . Status post cesarean delivery 05/09/2016  . Anemia in pregnancy 11/11/2015    Past Surgical History:  Procedure Laterality Date  . ABDOMINAL SURGERY    . CESAREAN SECTION  02/2014   pLTCS. FITL at term  . CESAREAN SECTION N/A 05/09/2016   Procedure: CESAREAN SECTION;  Surgeon: Conard NovakStephen D Jackson, MD;  Location: [facility] ORS;  Service: Obstetrics;  Laterality: N/A;  . NECK SURGERY  2003  . TONSILLECTOMY      Prior to Admission medications   Medication Sig Start Date End Date Taking? Authorizing Provider  cyclobenzaprine (FLEXERIL) 10 MG tablet Take 1 tablet (10 mg total) by mouth 3 (three) times daily as needed. 09/01/16   Joni Reiningonald K Smith, PA-C  ferrous sulfate 325 (65 FE) MG tablet Take 325 mg by mouth 2 (two) times daily with a meal.     Historical Provider, MD  ibuprofen (ADVIL,MOTRIN) 600 MG tablet Take 1 tablet (600 mg total) by mouth every 6 (six)  hours. 05/12/16   Conard NovakStephen D Jackson, MD  ibuprofen (ADVIL,MOTRIN) 600 MG tablet Take 1 tablet (600 mg total) by mouth every 8 (eight) hours as needed. 09/01/16   Joni Reiningonald K Smith, PA-C  ondansetron (ZOFRAN ODT) 4 MG disintegrating tablet Take 1 tablet (4 mg total) by mouth every 8 (eight) hours as needed for nausea or vomiting. 11/21/16   Jene Everyobert Georgetta Crafton, MD  oxyCODONE-acetaminophen (PERCOCET/ROXICET) 5-325 MG tablet Take 2 tablets by mouth every 4 (four) hours as needed (pain scale > 7). 05/12/16   Conard NovakStephen D Jackson, MD  Prenatal Vit-Fe Fumarate-FA (PRENATAL MULTIVITAMIN) TABS tablet Take 1 tablet by mouth daily at 12 noon.    Historical Provider, MD  sertraline (ZOLOFT) 50 MG tablet Take 50 mg by mouth daily.    Historical Provider, MD  traMADol (ULTRAM) 50 MG tablet Take 1 tablet (50 mg total) by mouth every 12 (twelve) hours as needed. 09/01/16   Joni Reiningonald K Smith, PA-C     Allergies Patient has no known allergies.  Family History  Problem Relation Age of Onset  . Diabetes Maternal Grandmother   . Hypertension Maternal Grandmother     Social History Social History  Substance Use Topics  . Smoking status: Never Smoker  . Smokeless tobacco: Never Used  . Alcohol use No    Review of Systems  Constitutional: No fever/chills Eyes: No visual changes.  ENT: No sore throat. Cardiovascular: Denies chest pain. Respiratory: Denies shortness of breath. Gastrointestinal:As above  Genitourinary: Negative for dysuria. Vaginal  discharge as above Musculoskeletal: Body aches Skin: Negative for rash.   10-point ROS otherwise negative.  ____________________________________________   PHYSICAL EXAM:  VITAL SIGNS: ED Triage Vitals  Enc Vitals Group     BP 11/21/16 1123 110/75     Pulse Rate 11/21/16 1123 (!) 137     Resp 11/21/16 1123 16     Temp 11/21/16 1123 98.5 F (36.9 C)     Temp Source 11/21/16 1123 Oral     SpO2 11/21/16 1123 99 %     Weight 11/21/16 1123 160 lb (72.6 kg)     Height  11/21/16 1123 5\' 3"  (1.6 m)     Head Circumference --      Peak Flow --      Pain Score 11/21/16 1130 8     Pain Loc --      Pain Edu? --      Excl. in GC? --     Constitutional: Alert and oriented. No acute distress. Pleasant and interactive Eyes: Conjunctivae are normal.   Nose: No congestion/rhinnorhea. Mouth/Throat: Mucous membranes are moist.   Neck:  Painless ROM Cardiovascular: Tachycardia, regular rhythm. Grossly normal heart sounds.  Good peripheral circulation. Respiratory: Normal respiratory effort.  No retractions. Lungs CTAB. Gastrointestinal: Soft and nontender. No distention.  No CVA tenderness. Benign exam Genitourinary: Thin white discharge, fishy odor Musculoskeletal: No lower extremity tenderness nor edema.  Warm and well perfused Neurologic:  Normal speech and language. No gross focal neurologic deficits are appreciated.  Skin:  Skin is warm, dry and intact. No rash noted. Psychiatric: Mood and affect are normal. Speech and behavior are normal.  ____________________________________________   LABS (all labs ordered are listed, but only abnormal results are displayed)  Labs Reviewed  WET PREP, GENITAL - Abnormal; Notable for the following:       Result Value   Clue Cells Wet Prep HPF POC PRESENT (*)    WBC, Wet Prep HPF POC MODERATE (*)    All other components within normal limits  CBC - Abnormal; Notable for the following:    WBC 17.4 (*)    Hemoglobin 9.0 (*)    HCT 28.3 (*)    MCV 62.4 (*)    MCH 19.9 (*)    MCHC 31.9 (*)    RDW 19.1 (*)    Platelets 487 (*)    All other components within normal limits  BASIC METABOLIC PANEL - Abnormal; Notable for the following:    CO2 20 (*)    Glucose, Bld 126 (*)    All other components within normal limits  CHLAMYDIA/NGC RT PCR ([facility] ONLY)    ____________________________________________  EKG  None ____________________________________________  RADIOLOGY  None ____________________________________________   PROCEDURES  Procedure(s) performed: No    Critical Care performed: No ____________________________________________   INITIAL IMPRESSION / ASSESSMENT AND PLAN / ED COURSE  Pertinent labs & imaging results that were available during my care of the patient were reviewed by me and considered in my medical decision making (see chart for details).  She presents with nausea vomiting and abdominal cramping. Her symptoms are certainly consistent with viral gastritis/gastroenteritis which is rampant indicating this time. This is supported by body aches, elevated temperature and tachycardia. Heart rate improved significantly with fluids and her nausea improved with Zofran. Incidentally she also has a vaginal discharge which is most consistent with bacterial vaginosis, this is unlikely to be the cause of her nausea and vomiting. Recommended Flagyl after she is feeling better. GC, chlamydia swabs  sent as well She is covered with discharge with nausea medication, return precautions discussed   ____________________________________________   FINAL CLINICAL IMPRESSION(S) / ED DIAGNOSES  Final diagnoses:  Viral gastritis  BV (bacterial vaginosis)      NEW MEDICATIONS STARTED DURING THIS VISIT:  New Prescriptions   ONDANSETRON (ZOFRAN ODT) 4 MG DISINTEGRATING TABLET    Take 1 tablet (4 mg total) by mouth every 8 (eight) hours as needed for nausea or vomiting.     Note:  This document was prepared using Dragon voice recognition software and may include unintentional dictation errors.    Jene Every, MD 11/21/16 [phone]

## 2016-11-21 NOTE — ED Triage Notes (Signed)
Vomiting/chills that began last night, able to keep water down, c/o feeling weak, and that her "heart hurts." Denies any heart events in the past, denies cough. NAD.

## 2017-03-05 ENCOUNTER — Emergency Department
Admission: EM | Admit: 2017-03-05 | Discharge: 2017-03-05 | Disposition: A | Discharge status: Home / Self Care | Payer: Managed Care, Other (non HMO) | Attending: Emergency Medicine | Admitting: Emergency Medicine

## 2017-03-05 ENCOUNTER — Encounter: Payer: Self-pay | Admitting: Emergency Medicine

## 2017-03-05 DIAGNOSIS — A6004 Herpesviral vulvovaginitis: Secondary | ICD-10-CM | POA: Diagnosis not present

## 2017-03-05 DIAGNOSIS — N3090 Cystitis, unspecified without hematuria: Secondary | ICD-10-CM | POA: Insufficient documentation

## 2017-03-05 DIAGNOSIS — R102 Pelvic and perineal pain: Secondary | ICD-10-CM | POA: Diagnosis present

## 2017-03-05 LAB — URINALYSIS, COMPLETE (UACMP) WITH MICROSCOPIC
Bacteria, UA: NONE SEEN
Bilirubin Urine: NEGATIVE
GLUCOSE, UA: NEGATIVE mg/dL
KETONES UR: NEGATIVE mg/dL
NITRITE: NEGATIVE
PH: 5 (ref 5.0–8.0)
Protein, ur: 100 mg/dL — AB
SPECIFIC GRAVITY, URINE: 1.025 (ref 1.005–1.030)

## 2017-03-05 LAB — CHLAMYDIA/NGC RT PCR (ARMC ONLY)
CHLAMYDIA TR: NOT DETECTED
N gonorrhoeae: NOT DETECTED

## 2017-03-05 LAB — WET PREP, GENITAL
Clue Cells Wet Prep HPF POC: NONE SEEN
SPERM: NONE SEEN
Trich, Wet Prep: NONE SEEN
Yeast Wet Prep HPF POC: NONE SEEN

## 2017-03-05 LAB — POCT PREGNANCY, URINE: PREG TEST UR: NEGATIVE

## 2017-03-05 MED ORDER — OXYCODONE-ACETAMINOPHEN 5-325 MG PO TABS
2.0000 | ORAL_TABLET | Freq: Once | ORAL | Status: AC
  Administered 2017-03-05: 2 via ORAL
  Filled 2017-03-05: qty 2

## 2017-03-05 MED ORDER — METRONIDAZOLE 500 MG PO TABS: 500.0000 mg | ORAL_TABLET | Freq: Two times a day (BID) | ORAL | 0 refills | Status: DC

## 2017-03-05 MED ORDER — VALACYCLOVIR HCL 1 G PO TABS: 2000.0000 mg | ORAL_TABLET | Freq: Two times a day (BID) | ORAL | 0 refills | Status: AC

## 2017-03-05 MED ORDER — OXYCODONE-ACETAMINOPHEN 5-325 MG PO TABS: 2.0000 | ORAL_TABLET | Freq: Four times a day (QID) | ORAL | 0 refills | Status: DC | PRN

## 2017-03-05 MED ORDER — LIDOCAINE HCL (PF) 1 % IJ SOLN
2.0000 mL | Freq: Once | INTRAMUSCULAR | Status: AC
  Administered 2017-03-05: 0.9 mL via INTRADERMAL
  Filled 2017-03-05: qty 5

## 2017-03-05 MED ORDER — CEFTRIAXONE SODIUM 250 MG IJ SOLR
250.0000 mg | INTRAMUSCULAR | Status: DC
  Administered 2017-03-05: 250 mg via INTRAMUSCULAR
  Filled 2017-03-05: qty 250

## 2017-03-05 MED ORDER — DOXYCYCLINE HYCLATE 100 MG PO CAPS: 100.0000 mg | ORAL_CAPSULE | Freq: Two times a day (BID) | ORAL | 0 refills | Status: DC

## 2017-03-05 MED ORDER — AZITHROMYCIN 500 MG PO TABS
1000.0000 mg | ORAL_TABLET | Freq: Once | ORAL | Status: AC
  Administered 2017-03-05: 1000 mg via ORAL
  Filled 2017-03-05: qty 2

## 2017-03-05 NOTE — ED Triage Notes (Addendum)
Patient presents to the ED with dysuria and vaginal pain.  Patient is tearful.  Patient states this is the 3rd day of pain.  Patient reports vaginal bleeding yesterday but states none today.

## 2017-03-05 NOTE — ED Provider Notes (Signed)
[facility] Emergency Department Provider Note       Time seen: ----------------------------------------- 7:53 AM on 03/05/2017 -----------------------------------------     I have reviewed the triage vital signs and the nursing notes.   HISTORY   Chief Complaint Vaginal Pain and Dysuria    HPI Melissa Coffey is a 21 y.o. female who presents to the ED for dysuria and vaginal pain. Patient feels like her vagina swollen at the introitus. She is tearful, states his third day of the pain. She reports vaginal bleeding yesterday but none today. She does not think she could be pregnant, pain is 6 out of 10. Nothing makes it better and urination makes it worse.   Past Medical History:  Diagnosis Date  . Anemia   . Depression   . Endometriosis     Patient Active Problem List   Diagnosis Date Noted  . Supervision of high risk pregnancy in third trimester 05/09/2016  . [redacted] weeks gestation of pregnancy 05/09/2016  . Previous cesarean delivery, antepartum condition or complication 05/09/2016  . Placental abruption in third trimester 05/09/2016  . Status post cesarean delivery 05/09/2016  . Anemia in pregnancy 11/11/2015    Past Surgical History:  Procedure Laterality Date  . ABDOMINAL SURGERY    . CESAREAN SECTION  02/2014   pLTCS. FITL at term  . CESAREAN SECTION N/A 05/09/2016   Procedure: CESAREAN SECTION;  Surgeon: Conard Novak, MD;  Location: [facility] ORS;  Service: Obstetrics;  Laterality: N/A;  . NECK SURGERY  2003  . TONSILLECTOMY      Allergies Patient has no known allergies.  Social History Social History  Substance Use Topics  . Smoking status: Never Smoker  . Smokeless tobacco: Never Used  . Alcohol use No    Review of Systems Constitutional: Negative for fever. Eyes: Negative for vision changes ENT:  Negative for congestion, sore throat Cardiovascular: Negative for chest pain. Respiratory: Negative for shortness of  breath. Gastrointestinal: Negative for abdominal pain, vomiting and diarrhea. Genitourinary: Positive for dysuria, vaginal discharge and vaginal pain Musculoskeletal: Negative for back pain. Skin: Negative for rash. Neurological: Negative for headaches, focal weakness or numbness.  All systems negative/normal/unremarkable except as stated in the HPI  ____________________________________________   PHYSICAL EXAM:  VITAL SIGNS: ED Triage Vitals  Enc Vitals Group     BP 03/05/17 0720 122/82     Pulse Rate 03/05/17 0720 (!) 105     Resp 03/05/17 0720 18     Temp 03/05/17 0720 98.6 F (37 C)     Temp Source 03/05/17 0720 Oral     SpO2 03/05/17 0720 100 %     Weight 03/05/17 0721 160 lb (72.6 kg)     Height 03/05/17 0721 5\' 4"  (1.626 m)     Head Circumference --      Peak Flow --      Pain Score 03/05/17 0724 8     Pain Loc --      Pain Edu? --      Excl. in GC? --     Constitutional: Alert and oriented. Well appearing and in no distress. Eyes: Conjunctivae are normal. Normal extraocular movements. Cardiovascular: Normal rate, regular rhythm. No murmurs, rubs, or gallops. Respiratory: Normal respiratory effort without tachypnea nor retractions. Breath sounds are clear and equal bilaterally. No wheezes/rales/rhonchi. Gastrointestinal: Soft and nontender. Normal bowel sounds Genitourinary: Multiple ulcerations inferiorly along the vaginal introitus, mild erythema and tenderness. Mild discharge. Musculoskeletal: Nontender with normal range of motion in  extremities. No lower extremity tenderness nor edema. Neurologic:  Normal speech and language. No gross focal neurologic deficits are appreciated.  Skin:  Skin is warm, dry and intact. No rash noted. Psychiatric: Mood and affect are normal. Speech and behavior are normal.  ____________________________________________  ED COURSE:  Pertinent labs & imaging results that were available during my care of the patient were reviewed by me  and considered in my medical decision making (see chart for details). Patient presents for Pelvic pain and dysuria, we will assess with labs and imaging as indicated.   Procedures ____________________________________________   LABS (pertinent positives/negatives)  Labs Reviewed  URINALYSIS, COMPLETE (UACMP) WITH MICROSCOPIC - Abnormal; Notable for the following:       Result Value   Color, Urine YELLOW (*)    APPearance CLOUDY (*)    Hgb urine dipstick SMALL (*)    Protein, ur 100 (*)    Leukocytes, UA LARGE (*)    Squamous Epithelial / LPF 6-30 (*)    Non Squamous Epithelial 0-5 (*)    All other components within normal limits  CHLAMYDIA/NGC RT PCR ([facility] ONLY)  WET PREP, GENITAL  POC URINE PREG, ED  POCT PREGNANCY, URINE   ____________________________________________  FINAL ASSESSMENT AND PLAN  Genital herpes, vaginal discharge  Plan: Patient's labs were dictated above. Patient had presented for pelvic pain and has evidence of genital herpes clinically. She also has evidence of UTI and likely PID. She was given Rocephin and Zithromax here and will be given doxycycline, Flagyl and valacyclovir. She is stable for outpatient follow-up.   Emily FilbertWilliams, Roye Gustafson E, MD   Note: This note was generated in part or whole with voice recognition software. Voice recognition is usually quite accurate but there are transcription errors that can and very often do occur. I apologize for any typographical errors that were not detected and corrected.     Emily FilbertWilliams, Loretha Ure E, MD 03/05/17 [phone]

## 2017-10-05 ENCOUNTER — Other Ambulatory Visit: Payer: Self-pay | Admitting: Nurse Practitioner

## 2017-11-09 ENCOUNTER — Ambulatory Visit (INDEPENDENT_AMBULATORY_CARE_PROVIDER_SITE_OTHER): Payer: Managed Care, Other (non HMO) | Admitting: Nurse Practitioner

## 2017-11-09 ENCOUNTER — Encounter: Payer: Self-pay | Admitting: Nurse Practitioner

## 2017-11-09 ENCOUNTER — Other Ambulatory Visit: Payer: Self-pay

## 2017-11-09 VITALS — BP 109/59 | HR 89 | Temp 98.5°F | Ht 63.0 in | Wt 161.2 lb

## 2017-11-09 DIAGNOSIS — O99012 Anemia complicating pregnancy, second trimester: Secondary | ICD-10-CM

## 2017-11-09 DIAGNOSIS — F418 Other specified anxiety disorders: Secondary | ICD-10-CM | POA: Diagnosis not present

## 2017-11-09 DIAGNOSIS — Z7689 Persons encountering health services in other specified circumstances: Secondary | ICD-10-CM | POA: Diagnosis not present

## 2017-11-09 MED ORDER — FLUOXETINE HCL 20 MG PO TABS: 20.0000 mg | ORAL_TABLET | Freq: Every day | ORAL | 1 refills | Status: DC

## 2017-11-09 NOTE — Progress Notes (Signed)
Subjective:    Patient ID: Melissa Coffey, female    DOB: 03/14/1996, 22 y.o.   MRN: [id number]  Melissa Coffey is a 22 y.o. female presenting on 11/09/2017 for Establish Care (depression)   HPI Establish Care New Provider Pt last seen by PCP was pediatrician and OB-GYN during pregnancies.  years ago.    Depression Is feeling down and cries/agitated about lots of things that aren't important. - Sometimes feels she would be better off not here, but does not have any motivation to carry out any plan and no plan to carry out SI/HI. - Current coping skills include avoidance and thinking about future for her kids - Pt has consistent feelings of not being good enough  Pt had previously taken Zoloft as prescribed by OBGYN.  She has stopped taking Zoloft, but had only taken about 2 weeks. Was 8.5 mos pregnant when it was initially prescribed in July and stopped after pregnancy.  Depression screen PHQ 2/9 11/09/2017  Decreased Interest 1  Down, Depressed, Hopeless 3  PHQ - 2 Score 4  Altered sleeping 3  Tired, decreased energy 3  Change in appetite 2  Feeling bad or failure about yourself  3  Trouble concentrating 1  Moving slowly or fidgety/restless 1  Suicidal thoughts 1  PHQ-9 Score 18  Difficult doing work/chores Somewhat difficult   GAD 7 : Generalized Anxiety Score 11/09/2017  Nervous, Anxious, on Edge 1  Control/stop worrying 2  Worry too much - different things 2  Trouble relaxing 2  Restless 1  Easily annoyed or irritable 2  Afraid - awful might happen 1  Total GAD 7 Score 11  Anxiety Difficulty Somewhat difficult   Past Medical History:  Diagnosis Date  . Anemia   . Depression   . Endometriosis    Past Surgical History:  Procedure Laterality Date  . ABDOMINAL SURGERY    . CESAREAN SECTION  02/2014   pLTCS. FITL at term  . CESAREAN SECTION N/A 05/09/2016   Procedure: CESAREAN SECTION;  Surgeon: Conard NovakStephen D Jackson, MD;  Location: [facility] ORS;  Service: Obstetrics;   Laterality: N/A;  . NECK SURGERY  2003  . TONSILLECTOMY     Social History   Socioeconomic History  . Marital status: Single    Spouse name: Not on file  . Number of children: 2  . Years of education: Not on file  . Highest education level: High school graduate  Social Needs  . Financial resource strain: Not on file  . Food insecurity - worry: Not on file  . Food insecurity - inability: Not on file  . Transportation needs - medical: Not on file  . Transportation needs - non-medical: Not on file  Occupational History  . Not on file  Tobacco Use  . Smoking status: Former Smoker    Years: 0.50    Last attempt to quit: 01/07/2017    Years since quitting: 0.8  . Smokeless tobacco: Never Used  Substance and Sexual Activity  . Alcohol use: No  . Drug use: No  . Sexual activity: Yes  Other Topics Concern  . Not on file  Social History Narrative  . Not on file   Family History  Problem Relation Age of Onset  . Diabetes Maternal Grandmother   . Hypertension Maternal Grandmother   . Cancer Maternal Grandmother   . Breast cancer Maternal Grandmother   . Diabetes Maternal Uncle   . Healthy Mother   . Healthy Brother   .  Healthy Daughter   . Healthy Son   . Healthy Brother   . Stroke Neg Hx   . Heart attack Neg Hx   . Colon cancer Neg Hx   . Ovarian cancer Neg Hx    Current Outpatient Medications on File Prior to Visit  Medication Sig  . levonorgestrel (MIRENA) 20 MCG/24HR IUD 1 each by Intrauterine route once.  . ferrous sulfate 325 (65 FE) MG tablet Take 325 mg by mouth 2 (two) times daily with a meal.   . sertraline (ZOLOFT) 50 MG tablet Take 50 mg by mouth daily.   No current facility-administered medications on file prior to visit.     Review of Systems Per HPI unless specifically indicated above      Objective:    BP (!) 109/59 (BP Location: Right Arm, Patient Position: Sitting, Cuff Size: Normal)   Pulse 89   Temp 98.5 F (36.9 C) (Oral)   Ht 5\' 3"  (1.6  m)   Wt 161 lb 3.2 oz (73.1 kg)   BMI 28.56 kg/m   Wt Readings from Last 3 Encounters:  11/09/17 161 lb 3.2 oz (73.1 kg)  03/05/17 160 lb (72.6 kg)  11/21/16 160 lb (72.6 kg)    Physical Exam  General - overweight, well-appearing, NAD HEENT - Normocephalic, atraumatic Neck - supple, non-tender, no LAD Heart - RRR, no murmurs heard Lungs - Clear throughout all lobes, no wheezing, crackles, or rhonchi. Normal work of breathing. Extremeties - non-tender, no edema, cap refill < 2 seconds, peripheral pulses intact +2 bilaterally Skin - warm, dry Neuro - awake, alert, oriented x3, normal gait Psych - Depressed mood and affect, normal behavior.  Participates well in conversation.       Assessment & Plan:   Problem List Items Addressed This Visit      Other   Anemia in pregnancy    Pt with iron deficiency in her last pregnancy.  Had been seen by Dr. Orlie Dakin for iron deficiency, but is no longer taking iron.  Desires to have her CBC and iron levels checked.      Relevant Orders   CBC with Differential/Platelet   Fe+TIBC+Fer   Depression with anxiety - Primary    Pt without continuation of sertraline after her pregnancy.  Now presents with depression and difficulty with anhedonia. PHQ9 positive.  Plan: 1. Start fluoxetine 20 mg once daily. 2. Encouraged stress management -Increase physical activity to 30 minutes most days of the week. 3. Followup 6 weeks.      Relevant Medications   FLUoxetine (PROZAC) 20 MG tablet    Other Visit Diagnoses    Encounter to establish care     Previous care provided by OB-GYN.  No recent PCP.  Past medical, family, and surgical history reviewed w/ pt.       Meds ordered this encounter  Medications  . FLUoxetine (PROZAC) 20 MG tablet    Sig: Take 1 tablet (20 mg total) by mouth daily.    Dispense:  30 tablet    Refill:  1    Order Specific Question:   Supervising Provider    Answer:   Smitty Cords [[id number]]      Follow  up plan: Return in about 6 weeks (around 12/21/2017) for depression with anxiety.  Wilhelmina Mcardle, DNP, AGPCNP-BC Adult Gerontology Primary Care Nurse Practitioner [facility] 11/29/2017, 5:48 AM

## 2017-11-09 NOTE — Patient Instructions (Addendum)
Melissa HackerHarley, Thank you for coming in to clinic today.  1. START fluoxetine 20 mg once daily.  If this makes you nauseated, you may reduce to 1/2 tablet once daily for 7 days then increase back to 1 full tablet once daily.  2. Work on stress management strategies. - Find 1-2 everyday that you love about yourself or about your life.  3. If you have worsening depression call clinic.  If you have a plan to carry out suicide, call 9-1-1. You can also get psychiatry and/or counseling at   PSYCHIATRY / THERAPY-COUSENLING Self Referral [facility](Behavioral Health) [place] [address], [place], [place] [address] Phone: [phone]  [facility], available walk-in 9am-4pm M-F for emergencies as well if needed. [address] [place], [place] [address] Hours: 9am - 4pm (M-F, walk in available) [phone]  [facility]   Address: [address], [place], [place] [address] Hours: 8AM-5PM (accepts walk in to establish) Phone: [phone]  We have other referral options as well from the clinic.  Please schedule a follow-up appointment with Wilhelmina McardleLauren Radames Mejorado, AGNP. Return in about 6 weeks (around 12/21/2017) for depression with anxiety.  If you have any other questions or concerns, please feel free to call the clinic or send a message through MyChart. You may also schedule an earlier appointment if necessary.  You will receive a survey after today's visit either digitally by e-mail or paper by [employer] mail. Your experiences and feedback matter to us.  Please respond so we know how we are doing as we provide care for you.   Wilhelmina McardleLauren Carmin Alvidrez, DNP, AGNP-BC Adult Gerontology Nurse Practitioner [facility], [facility]   Living With Depression Everyone experiences occasional disappointment, sadness, and loss in their lives. When you are feeling down, blue, or sad for at least 2 weeks in a row, it may mean that you have depression. Depression can  affect your thoughts and feelings, relationships, daily activities, and physical health. It is caused by changes in the way your brain functions. If you receive a diagnosis of depression, your health care provider will tell you which type of depression you have and what treatment options are available to you. If you are living with depression, there are ways to help you recover from it and also ways to prevent it from coming back. How to cope with lifestyle changes Coping with stress Stress is your body's reaction to life changes and events, both good and bad. Stressful situations may include:  Getting married.  The death of a spouse.  Losing a job.  Retiring.  Having a baby.  Stress can last just a few hours or it can be ongoing. Stress can play a major role in depression, so it is important to learn both how to cope with stress and how to think about it differently. Talk with your health care provider or a counselor if you would like to learn more about stress reduction. He or she may suggest some stress reduction techniques, such as:  Music therapy. This can include creating music or listening to music. Choose music that you enjoy and that inspires you.  Mindfulness-based meditation. This kind of meditation can be done while sitting or walking. It involves being aware of your normal breaths, rather than trying to control your breathing.  Centering prayer. This is a kind of meditation that involves focusing on a spiritual word or phrase. Choose a word, phrase, or sacred image that is meaningful to you and that  brings you peace.  Deep breathing. To do this, expand your stomach and inhale slowly through your nose. Hold your breath for 3-5 seconds, then exhale slowly, allowing your stomach muscles to relax.  Muscle relaxation. This involves intentionally tensing muscles then relaxing them.  Choose a stress reduction technique that fits your lifestyle and personality. Stress reduction  techniques take time and practice to develop. Set aside 5-15 minutes a day to do them. Therapists can offer training in these techniques. The training may be covered by some insurance plans. Other things you can do to manage stress include:  Keeping a stress diary. This can help you learn what triggers your stress and ways to control your response.  Understanding what your limits are and saying no to requests or events that lead to a schedule that is too full.  Thinking about how you respond to certain situations. You may not be able to control everything, but you can control how you react.  Adding humor to your life by watching funny films or TV shows.  Making time for activities that help you relax and not feeling guilty about spending your time this way.  Medicines Your health care provider may suggest certain medicines if he or she feels that they will help improve your condition. Avoid using alcohol and other substances that may prevent your medicines from working properly (may interact). It is also important to:  Talk with your pharmacist or health care provider about all the medicines that you take, their possible side effects, and what medicines are safe to take together.  Make it your goal to take part in all treatment decisions (shared decision-making). This includes giving input on the side effects of medicines. It is best if shared decision-making with your health care provider is part of your total treatment plan.  If your health care provider prescribes a medicine, you may not notice the full benefits of it for 4-8 weeks. Most people who are treated for depression need to be on medicine for at least 6-12 months after they feel better. If you are taking medicines as part of your treatment, do not stop taking medicines without first talking to your health care provider. You may need to have the medicine slowly decreased (tapered) over time to decrease the risk of harmful side  effects. Relationships Your health care provider may suggest family therapy along with individual therapy and drug therapy. While there may not be family problems that are causing you to feel depressed, it is still important to make sure your family learns as much as they can about your mental health. Having your family's support can help make your treatment successful. How to recognize changes in your condition Everyone has a different response to treatment for depression. Recovery from major depression happens when you have not had signs of major depression for two months. This may mean that you will start to:  Have more interest in doing activities.  Feel less hopeless than you did 2 months ago.  Have more energy.  Overeat less often, or have better or improving appetite.  Have better concentration.  Your health care provider will work with you to decide the next steps in your recovery. It is also important to recognize when your condition is getting worse. Watch for these signs:  Having fatigue or low energy.  Eating too much or too little.  Sleeping too much or too little.  Feeling restless, agitated, or hopeless.  Having trouble concentrating or making decisions.  Having  unexplained physical complaints.  Feeling irritable, angry, or aggressive.  Get help as soon as you or your family members notice these symptoms coming back. How to get support and help from others How to talk with friends and family members about your condition Talking to friends and family members about your condition can provide you with one way to get support and guidance. Reach out to trusted friends or family members, explain your symptoms to them, and let them know that you are working with a health care provider to treat your depression. Financial resources Not all insurance plans cover mental health care, so it is important to check with your insurance carrier. If paying for co-pays or counseling  services is a problem, search for a local or county mental health care center. They may be able to offer public mental health care services at low or no cost when you are not able to see a private health care provider. If you are taking medicine for depression, you may be able to get the generic form, which may be less expensive. Some makers of prescription medicines also offer help to patients who cannot afford the medicines they need. Follow these instructions at home:  Get the right amount and quality of sleep.  Cut down on using caffeine, tobacco, alcohol, and other potentially harmful substances.  Try to exercise, such as walking or lifting small weights.  Take over-the-counter and prescription medicines only as told by your health care provider.  Eat a healthy diet that includes plenty of vegetables, fruits, whole grains, low-fat dairy products, and lean protein. Do not eat a lot of foods that are high in solid fats, added sugars, or salt.  Keep all follow-up visits as told by your health care provider. This is important. Contact a health care provider if:  You stop taking your antidepressant medicines, and you have any of these symptoms: ? Nausea. ? Headache. ? Feeling lightheaded. ? Chills and body aches. ? Not being able to sleep (insomnia).  You or your friends and family think your depression is getting worse. Get help right away if:  You have thoughts of hurting yourself or others. If you ever feel like you may hurt yourself or others, or have thoughts about taking your own life, get help right away. You can go to your nearest emergency department or call:  Your local emergency services (911 in the [place]  A suicide crisis helpline, such as the National Suicide Prevention Lifeline at 574-070-3533. This is open 24-hours a day.  Summary  If you are living with depression, there are ways to help you recover from it and also ways to prevent it from coming back.  Work  with your health care team to create a management plan that includes counseling, stress management techniques, and healthy lifestyle habits. This information is not intended to replace advice given to you by your health care provider. Make sure you discuss any questions you have with your health care provider. Document Released: 09/18/2016 Document Revised: 09/18/2016 Document Reviewed: 09/18/2016 Elsevier Interactive Patient Education  [employer].

## 2017-11-29 DIAGNOSIS — F419 Anxiety disorder, unspecified: Secondary | ICD-10-CM | POA: Insufficient documentation

## 2017-11-29 DIAGNOSIS — F32A Depression, unspecified: Secondary | ICD-10-CM | POA: Insufficient documentation

## 2017-11-29 DIAGNOSIS — F329 Major depressive disorder, single episode, unspecified: Secondary | ICD-10-CM | POA: Insufficient documentation

## 2017-11-29 NOTE — Assessment & Plan Note (Signed)
Pt without continuation of sertraline after her pregnancy.  Now presents with depression and difficulty with anhedonia. PHQ9 positive.  Plan: 1. Start fluoxetine 20 mg once daily. 2. Encouraged stress management -Increase physical activity to 30 minutes most days of the week. 3. Followup 6 weeks.

## 2017-11-29 NOTE — Assessment & Plan Note (Signed)
Pt with iron deficiency in her last pregnancy.  Had been seen by Dr. Orlie DakinFinnegan for iron deficiency, but is no longer taking iron.  Desires to have her CBC and iron levels checked.

## 2017-11-30 ENCOUNTER — Ambulatory Visit (INDEPENDENT_AMBULATORY_CARE_PROVIDER_SITE_OTHER): Payer: Managed Care, Other (non HMO) | Admitting: Nurse Practitioner

## 2017-11-30 ENCOUNTER — Other Ambulatory Visit: Payer: Self-pay

## 2017-11-30 ENCOUNTER — Encounter: Payer: Self-pay | Admitting: Nurse Practitioner

## 2017-11-30 VITALS — BP 108/66 | HR 98 | Temp 98.6°F | Ht 63.0 in | Wt 153.4 lb

## 2017-11-30 DIAGNOSIS — L981 Factitial dermatitis: Secondary | ICD-10-CM

## 2017-11-30 DIAGNOSIS — F329 Major depressive disorder, single episode, unspecified: Secondary | ICD-10-CM

## 2017-11-30 DIAGNOSIS — F424 Excoriation (skin-picking) disorder: Secondary | ICD-10-CM | POA: Insufficient documentation

## 2017-11-30 DIAGNOSIS — F419 Anxiety disorder, unspecified: Secondary | ICD-10-CM | POA: Diagnosis not present

## 2017-11-30 MED ORDER — SERTRALINE HCL 50 MG PO TABS: 50.0000 mg | ORAL_TABLET | Freq: Every day | ORAL | 2 refills | Status: DC

## 2017-11-30 MED ORDER — HYDROXYZINE HCL 50 MG PO TABS: 50.0000 mg | ORAL_TABLET | Freq: Three times a day (TID) | ORAL | 1 refills | Status: DC | PRN

## 2017-11-30 NOTE — Progress Notes (Signed)
Subjective:    Patient ID: Melissa RutherfordHarley R Shiller, female    DOB: April 11, 1996, 22 y.o.   MRN: [id number]  Melissa Coffey is a 22 y.o. female presenting on 11/30/2017 for Anxiety (no improvement with medication. zone out, emotional, tired ) and Depression (crying spells)   HPI Anxiety and Depression Pt presents today before scheduled followup as encouraged by her family and boyfriend, Amalia HaileyDustin who is with her in clinic today.  Pt and Dustin report significantly worsening of her anxiety and depression symptoms with excessive picking of skin, nervousness, zoning out at work, tearfulness, irritability and SI.  Thoughts of SI are "maybe my kids would be better off without me."  Instead of last visit, "I need to still be here for my kids." Pt continues to report that her kids help her avoid developing a plan for SI, but today, pt verbalizes, "I'm not thinking of going to the kitchen to get a knife or anything," which indicates a plan may be more present than patient admits. - Pt/Dustin report they and other family have concerns about her new medication fluoxetine and are worried that her symptoms may be related to side effects of the medicine.  The symptoms worsened about 1 week on fluoxetine.  - Pt was accused at work yesterday of being on recreational drugs because her behavior has changed that much over the last 3 weeks. - Pt reports she is often not aware that she is picking her skin/performing self-mutilation.  [facility] 11/30/2017 11/09/2017  Decreased Interest 3 1  Down, Depressed, Hopeless 3 3  PHQ - 2 Score 6 4  Altered sleeping 3 3  Tired, decreased energy 3 3  Change in appetite 3 2  Feeling bad or failure about yourself  3 3  Trouble concentrating 2 1  Moving slowly or fidgety/restless 3 1  Suicidal thoughts 2 1  PHQ-9 Score 25 18  Difficult doing work/chores Very difficult Somewhat difficult   GAD 7 : Generalized Anxiety Score 11/30/2017 11/09/2017  Nervous, Anxious, on Edge 3 1    Control/stop worrying 3 2  Worry too much - different things 3 2  Trouble relaxing 3 2  Restless 2 1  Easily annoyed or irritable 3 2  Afraid - awful might happen 2 1  Total GAD 7 Score 19 11  Anxiety Difficulty Very difficult Somewhat difficult    Social History   Tobacco Use  . Smoking status: Former Smoker    Years: 0.50    Last attempt to quit: 01/07/2017    Years since quitting: 0.8  . Smokeless tobacco: Never Used  Substance Use Topics  . Alcohol use: No  . Drug use: No    Review of Systems Per HPI unless specifically indicated above     Objective:    BP 108/66 (BP Location: Right Arm, Patient Position: Sitting, Cuff Size: Normal)   Pulse 98   Temp 98.6 F (37 C) (Oral)   Ht 5\' 3"  (1.6 m)   Wt 153 lb 6.4 oz (69.6 kg)   BMI 27.17 kg/m   Wt Readings from Last 3 Encounters:  11/30/17 153 lb 6.4 oz (69.6 kg)  11/09/17 161 lb 3.2 oz (73.1 kg)  03/05/17 160 lb (72.6 kg)    Physical Exam  Constitutional: She is oriented to person, place, and time. She appears well-developed. She appears distressed.  HENT:  Head: Normocephalic.  Cardiovascular: Normal rate, regular rhythm, normal heart sounds and intact distal pulses.  Pulmonary/Chest: Effort normal and  breath sounds normal. No respiratory distress.  Neurological: She is alert and oriented to person, place, and time.  Skin: Skin is warm. She is diaphoretic.  Multiple lesions on face, neck and hands from excoriation and manual removal of epidermis.  Largest lesion from picking is on left lower jaw and is 2.5 cm x 3 cm in diameter.    Psychiatric: Her mood appears anxious. Her speech is rapid and/or pressured. She is hyperactive (rolling/rubbing hands together, picking with nails at skin on hands during visit) and withdrawn. Cognition and memory are normal. She does not express impulsivity or inappropriate judgment. She expresses suicidal ideation. She expresses no suicidal plans (reports no plan, but uses statements  suggesting a plan is developing (see HPI)).  Vitals reviewed.      Assessment & Plan:   Problem List Items Addressed This Visit      Other   Anxiety and depression - Primary   Relevant Medications   hydrOXYzine (ATARAX/VISTARIL) 50 MG tablet   sertraline (ZOLOFT) 50 MG tablet   Other Relevant Orders   Ambulatory referral to Psychiatry    Other Visit Diagnoses    Anxiety       Relevant Medications   hydrOXYzine (ATARAX/VISTARIL) 50 MG tablet   sertraline (ZOLOFT) 50 MG tablet   Picking own skin       Relevant Orders   Ambulatory referral to Psychiatry    Uncontrolled symptoms on new fluoxetine.  Since starting fluoxetine, symptoms have worsened and pt is now practicing self-mutilation via compulsive picking at exposed areas of skin to include face, neck hands, arms.  Disfigurement is now bothersome to patient as people are beginning to question why she is behaving this way, even accusing her of using recreational drugs.  Pt and boyfriend continue to adamantly deny these allegations.  Plan: 1. Prefer to utilize higher dose fluoxetine as I believe symptoms are progression of anxiety/depression/psychiatric disorder and would benefit from higher dose.  However, pt and boyfriend will not continue current medication. 2. START sertraline 50 mg once daily.  Take one tablet once daily 3. START hydroxyzine 50 mg tid prn anxiety. 4. Referral to [facility] for treatment via psychiatry and counseling program. 5. Followup 2 weeks for close monitoring. 6. Reviewed s/sx of emergency.  Provided instructions for handling emergencies with walk in/call to [facility] vs ED.  Meds ordered this encounter  Medications  . hydrOXYzine (ATARAX/VISTARIL) 50 MG tablet    Sig: Take 1 tablet (50 mg total) by mouth 3 (three) times daily as needed for anxiety.    Dispense:  90 tablet    Refill:  1    Order Specific Question:   Supervising Provider    Answer:   Smitty Cords [[id number]]  . sertraline (ZOLOFT) 50 MG  tablet    Sig: Take 1 tablet (50 mg total) by mouth daily.    Dispense:  30 tablet    Refill:  2    Order Specific Question:   Supervising Provider    Answer:   Smitty Cords [[id number]]    Follow up plan: Return in about 2 weeks (around 12/14/2017) for anxiety and depression.  A total of 30 minutes was spent face-to-face with this patient. Greater than 50% of this time was spent in counseling and coordination of care with the patient regarding disease state, medications, brief intervention for body scan/deep breathing.   Wilhelmina Mcardle, DNP, AGPCNP-BC Adult Gerontology Primary Care Nurse Practitioner [facility] 11/30/2017, 2:02  PM

## 2017-11-30 NOTE — Patient Instructions (Addendum)
Melissa HackerHarley, Thank you for coming in to clinic today.  1. STOP taking Prozac 2. START Sertraline 50 mg once daily 3. START hydroxyzine today for anxiety.  You may take 1 tablet 3 times daily for anxiety.  If it makes you too sleepy, take only at night.  You may take 100 mg for single dose at bedtime if needed without any during the day.  PSYCHIATRY / THERAPY-COUSENLING Internal Referral [facility]([place] [facility]) Address: [address], [place], [place] [address] Hours: 8:30AM-5PM Phone: [phone]  SELF REFERRAL - You may call to schedule at [facility] or walk-in anytime [name] is open [facility](Behavioral Health) [facility] [address], [place], [place] [address] Phone: [phone] - Phone any time for emergencies, 9-1-1, or to ER.  [facility], available walk-in 9am-4pm M-F [address] [place], [place] [address] Hours: 9am - 4pm (M-F, walk in available) [phone]  [facility]   Address: [address], [place], [place] [address] Hours: 8AM-5PM (accepts walk in to establish) Phone: [phone]   Please schedule a follow-up appointment with Wilhelmina McardleLauren Maddalynn Barnard, AGNP. Return in about 2 weeks (around 12/14/2017) for anxiety and depression.  If you have any other questions or concerns, please feel free to call the clinic or send a message through MyChart. You may also schedule an earlier appointment if necessary.  You will receive a survey after today's visit either digitally by e-mail or paper by [employer] mail. Your experiences and feedback matter to us.  Please respond so we know how we are doing as we provide care for you.   Wilhelmina McardleLauren Renaud Celli, DNP, AGNP-BC Adult Gerontology Nurse Practitioner [facility], [facility]

## 2017-12-10 ENCOUNTER — Telehealth: Payer: Self-pay | Admitting: Nurse Practitioner

## 2017-12-10 NOTE — Telephone Encounter (Signed)
Blood in vomit is not a known side effect of either new medication.  Most likely is not related to medication.  Is most likely new cause of bleeding.  If bright red blood in her vomit for more than 1 occurrence, should be evaluated in ED.  If only small streaks and only one occurrence, is safe to continue to monitor.    - Please followup on psychiatry referral as well.

## 2017-12-10 NOTE — Telephone Encounter (Signed)
Attempt to contact the patient, no answer. LMOM to return my call.

## 2017-12-10 NOTE — Telephone Encounter (Signed)
I spoke w/ the patient about her blood in vomit. She informed me that she only seen it once and it was mixed with food. She said she was feeling nauseated and had some stomach discomfort that resolved after vomiting. The pt states it wasn't scanty , but it was not a large amount of bright red blood. I recommended the patient to continue monitoring and if she notice blood again she should be evaluated in ED. She verbalize understanding.

## 2017-12-10 NOTE — Telephone Encounter (Signed)
Pt. Called states that she was put on new  Medication and had thrown up blood. Pt  Have requested a call back [phone].

## 2017-12-21 ENCOUNTER — Encounter: Payer: Self-pay | Admitting: Nurse Practitioner

## 2017-12-21 ENCOUNTER — Ambulatory Visit: Payer: Self-pay | Admitting: Nurse Practitioner

## 2017-12-24 ENCOUNTER — Encounter: Payer: Self-pay | Admitting: Nurse Practitioner

## 2018-02-07 ENCOUNTER — Emergency Department: Payer: Managed Care, Other (non HMO)

## 2018-02-07 ENCOUNTER — Encounter: Payer: Self-pay | Admitting: Emergency Medicine

## 2018-02-07 ENCOUNTER — Emergency Department
Admission: EM | Admit: 2018-02-07 | Discharge: 2018-02-07 | Disposition: A | Discharge status: Home / Self Care | Payer: Managed Care, Other (non HMO) | Attending: Emergency Medicine | Admitting: Emergency Medicine

## 2018-02-07 ENCOUNTER — Other Ambulatory Visit: Payer: Self-pay

## 2018-02-07 DIAGNOSIS — S0990XA Unspecified injury of head, initial encounter: Secondary | ICD-10-CM | POA: Insufficient documentation

## 2018-02-07 DIAGNOSIS — Y9389 Activity, other specified: Secondary | ICD-10-CM | POA: Diagnosis not present

## 2018-02-07 DIAGNOSIS — T148XXA Other injury of unspecified body region, initial encounter: Secondary | ICD-10-CM | POA: Insufficient documentation

## 2018-02-07 DIAGNOSIS — S0003XA Contusion of scalp, initial encounter: Secondary | ICD-10-CM | POA: Diagnosis not present

## 2018-02-07 DIAGNOSIS — Y929 Unspecified place or not applicable: Secondary | ICD-10-CM | POA: Diagnosis not present

## 2018-02-07 DIAGNOSIS — Z79899 Other long term (current) drug therapy: Secondary | ICD-10-CM | POA: Diagnosis not present

## 2018-02-07 DIAGNOSIS — Z87891 Personal history of nicotine dependence: Secondary | ICD-10-CM | POA: Insufficient documentation

## 2018-02-07 DIAGNOSIS — Y999 Unspecified external cause status: Secondary | ICD-10-CM | POA: Diagnosis not present

## 2018-02-07 MED ORDER — NAPROXEN 500 MG PO TABS: 500.0000 mg | ORAL_TABLET | Freq: Two times a day (BID) | ORAL | 0 refills | Status: DC

## 2018-02-07 MED ORDER — CYCLOBENZAPRINE HCL 10 MG PO TABS: 10.0000 mg | ORAL_TABLET | Freq: Three times a day (TID) | ORAL | 0 refills | Status: DC | PRN
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
## 2018-02-07 NOTE — ED Notes (Signed)
ACSD at bedside
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
## 2018-04-22 ENCOUNTER — Emergency Department
Admission: EM | Admit: 2018-04-22 | Discharge: 2018-04-22 | Disposition: A | Discharge status: Home / Self Care | Payer: Managed Care, Other (non HMO) | Attending: Emergency Medicine | Admitting: Emergency Medicine

## 2018-04-22 ENCOUNTER — Encounter: Payer: Self-pay | Admitting: Emergency Medicine

## 2018-04-22 DIAGNOSIS — L03115 Cellulitis of right lower limb: Secondary | ICD-10-CM | POA: Diagnosis not present

## 2018-04-22 DIAGNOSIS — F419 Anxiety disorder, unspecified: Secondary | ICD-10-CM | POA: Diagnosis not present

## 2018-04-22 DIAGNOSIS — Z79899 Other long term (current) drug therapy: Secondary | ICD-10-CM | POA: Insufficient documentation

## 2018-04-22 DIAGNOSIS — F329 Major depressive disorder, single episode, unspecified: Secondary | ICD-10-CM | POA: Insufficient documentation

## 2018-04-22 DIAGNOSIS — Z87891 Personal history of nicotine dependence: Secondary | ICD-10-CM | POA: Insufficient documentation

## 2018-04-22 DIAGNOSIS — R2241 Localized swelling, mass and lump, right lower limb: Secondary | ICD-10-CM | POA: Diagnosis present

## 2018-04-22 MED ORDER — HYDROCODONE-ACETAMINOPHEN 5-325 MG PO TABS: 1.0000 | ORAL_TABLET | Freq: Four times a day (QID) | ORAL | 0 refills | Status: AC | PRN

## 2018-04-22 MED ORDER — SULFAMETHOXAZOLE-TRIMETHOPRIM 800-160 MG PO TABS: 1.0000 | ORAL_TABLET | Freq: Two times a day (BID) | ORAL | 0 refills | Status: DC

## 2018-04-22 NOTE — ED Provider Notes (Signed)
[facility] Emergency Department Provider Note  ____________________________________________  Time seen: Approximately 4:45 PM  I have reviewed the triage vital signs and the nursing notes.   HISTORY  Chief Complaint Abscess   HPI Melissa Coffey is a 22 y.o. female who presents to the emergency department for treatment and evaluation of pain in the right knee.  About 2 days ago, she noticed a small bite or pimple and she picked it.  Area has now become swollen, increasingly erythematous and is draining some yellow and pink fluid.  She has had no fever chills.  She does have a history of compulsive skin picking.  No alleviating measures attempted for this complaint prior to arrival.   Past Medical History:  Diagnosis Date  . Anemia   . Depression   . Endometriosis     Patient Active Problem List   Diagnosis Date Noted  . Compulsive skin picking 11/30/2017  . Anxiety and depression 11/29/2017  . Previous cesarean delivery, antepartum condition or complication 05/09/2016  . Status post cesarean delivery 05/09/2016  . Anemia in pregnancy 11/11/2015    Past Surgical History:  Procedure Laterality Date  . ABDOMINAL SURGERY    . CESAREAN SECTION  02/2014   pLTCS. FITL at term  . CESAREAN SECTION N/A 05/09/2016   Procedure: CESAREAN SECTION;  Surgeon: Conard NovakStephen D Jackson, MD;  Location: [facility] ORS;  Service: Obstetrics;  Laterality: N/A;  . NECK SURGERY  2003  . TONSILLECTOMY      Prior to Admission medications   Medication Sig Start Date End Date Taking? Authorizing Provider  cyclobenzaprine (FLEXERIL) 10 MG tablet Take 1 tablet (10 mg total) by mouth 3 (three) times daily as needed for muscle spasms. 02/07/18   Loucinda Croy, Rulon Eisenmengerari B, FNP  ferrous sulfate 325 (65 FE) MG tablet Take 325 mg by mouth 2 (two) times daily with a meal.     [provider]  HYDROcodone-acetaminophen (NORCO/VICODIN) 5-325 MG tablet Take 1 tablet by mouth every 6 (six) hours as  needed for up to 3 days for severe pain. 04/22/18 04/25/18  Asani Deniston, Rulon Eisenmengerari B, FNP  hydrOXYzine (ATARAX/VISTARIL) 50 MG tablet Take 1 tablet (50 mg total) by mouth 3 (three) times daily as needed for anxiety. 11/30/17   Galen ManilaKennedy, Lauren Renee, NP  levonorgestrel (MIRENA) 20 MCG/24HR IUD 1 each by Intrauterine route once.    [provider]  naproxen (NAPROSYN) 500 MG tablet Take 1 tablet (500 mg total) by mouth 2 (two) times daily with a meal. 02/07/18   Jovante Hammitt B, FNP  sertraline (ZOLOFT) 50 MG tablet Take 1 tablet (50 mg total) by mouth daily. 11/30/17   Galen ManilaKennedy, Lauren Renee, NP  sulfamethoxazole-trimethoprim (BACTRIM DS,SEPTRA DS) 800-160 MG tablet Take 1 tablet by mouth 2 (two) times daily. 04/22/18   Chinita Pesterriplett, Malyssa Maris B, FNP    Allergies Patient has no known allergies.  Family History  Problem Relation Age of Onset  . Diabetes Maternal Grandmother   . Hypertension Maternal Grandmother   . Cancer Maternal Grandmother   . Breast cancer Maternal Grandmother   . Diabetes Maternal Uncle   . Healthy Mother   . Healthy Brother   . Healthy Daughter   . Healthy Son   . Healthy Brother   . Stroke Neg Hx   . Heart attack Neg Hx   . Colon cancer Neg Hx   . Ovarian cancer Neg Hx     Social History Social History   Tobacco Use  . Smoking status:  Former Smoker    Years: 0.50    Last attempt to quit: 01/07/2017    Years since quitting: 1.2  . Smokeless tobacco: Never Used  Substance Use Topics  . Alcohol use: No  . Drug use: No    Review of Systems  Constitutional: Negative for fever. Respiratory: Negative for cough or shortness of breath.  Musculoskeletal: Negative for myalgias Skin: Positive for lesion of the right knee Neurological: Negative for numbness or paresthesias. ____________________________________________   PHYSICAL EXAM:  VITAL SIGNS: ED Triage Vitals  Enc Vitals Group     BP 04/22/18 1459 116/79     Pulse Rate 04/22/18 1459 (!) 125     Resp 04/22/18  1459 20     Temp 04/22/18 1459 98.3 F (36.8 C)     Temp Source 04/22/18 1459 Oral     SpO2 04/22/18 1459 100 %     Weight 04/22/18 1500 135 lb (61.2 kg)     Height 04/22/18 1500 5\' 3"  (1.6 m)     Head Circumference --      Peak Flow --      Pain Score 04/22/18 1459 8     Pain Loc --      Pain Edu? --      Excl. in GC? --      Constitutional: Well appearing. Eyes: Conjunctivae are clear without discharge or drainage. Nose: No rhinorrhea noted. Mouth/Throat: Airway is patent.  Neck: No stridor. Unrestricted range of motion observed. Cardiovascular: Capillary refill is <3 seconds.  Respiratory: Respirations are even and unlabored.. Musculoskeletal: Unrestricted range of motion of the right knee observed. Neurologic: Awake, alert, and oriented x 4.  Skin: Erythematous, indurated area over the patella with a punctate wound.  No fluctuance.  Local edema in the area of the erythema.  ____________________________________________   LABS (all labs ordered are listed, but only abnormal results are displayed)  Labs Reviewed - No data to display ____________________________________________  EKG  Not indicated. ____________________________________________  RADIOLOGY  Not indicated ____________________________________________   PROCEDURES  Procedures ____________________________________________   INITIAL IMPRESSION / ASSESSMENT AND PLAN / ED COURSE  Melissa Coffey is a 22 y.o. female who presents to the emergency department for treatment and evaluation of right knee pain secondary to a wound, pustule, or insect bite.  Symptoms and exam most consistent with cellulitis.  She will be treated with Bactrim and given a few Norco for pain control.  Controlled substance registry was researched prior to prescribing.  Area of erythema was marked with a surgical pen and the patient was advised to return to the emergency department if the erythema spreads beyond the markings or the pain  gets worse.  She is to follow-up with her primary care provider in 2 to 3 days for recheck.   Medications - No data to display   Pertinent labs & imaging results that were available during my care of the patient were reviewed by me and considered in my medical decision making (see chart for details).  ____________________________________________   FINAL CLINICAL IMPRESSION(S) / ED DIAGNOSES  Final diagnoses:  Cellulitis of right knee    ED Discharge Orders        Ordered    sulfamethoxazole-trimethoprim (BACTRIM DS,SEPTRA DS) 800-160 MG tablet  2 times daily     04/22/18 1555    HYDROcodone-acetaminophen (NORCO/VICODIN) 5-325 MG tablet  Every 6 hours PRN     04/22/18 1555       Note:  This document was prepared using  [employer] and may include unintentional dictation errors.    Chinita Pester, FNP 04/22/18 1652    Phineas Semen, MD 04/22/18 [phone]

## 2018-04-22 NOTE — ED Triage Notes (Signed)
Pt reports awoke this am and had a hole in her knee and redness. Pt unsure if she was bit or what but states that it hurts. Redness noted to right knee.

## 2018-04-22 NOTE — Discharge Instructions (Signed)
Follow up with primary care or return to the ER for symptoms that are not improving over the next couple of days or sooner if worse.

## 2018-04-22 NOTE — ED Notes (Signed)
Pt c/o redness and swelling of the right knee.. States there was a small pimple like area that started a couple of days ago that has gotten worse and now having yellow/pink colored drainage. Denies fever or chills.

## 2018-05-13 ENCOUNTER — Other Ambulatory Visit: Payer: Self-pay

## 2018-05-13 ENCOUNTER — Encounter: Payer: Self-pay | Admitting: Emergency Medicine

## 2018-05-13 ENCOUNTER — Emergency Department
Admission: EM | Admit: 2018-05-13 | Discharge: 2018-05-13 | Disposition: A | Discharge status: Home / Self Care | Payer: Managed Care, Other (non HMO) | Attending: Emergency Medicine | Admitting: Emergency Medicine

## 2018-05-13 DIAGNOSIS — Z79899 Other long term (current) drug therapy: Secondary | ICD-10-CM | POA: Diagnosis not present

## 2018-05-13 DIAGNOSIS — L03211 Cellulitis of face: Secondary | ICD-10-CM | POA: Insufficient documentation

## 2018-05-13 DIAGNOSIS — Z87891 Personal history of nicotine dependence: Secondary | ICD-10-CM | POA: Insufficient documentation

## 2018-05-13 DIAGNOSIS — R600 Localized edema: Secondary | ICD-10-CM | POA: Diagnosis present

## 2018-05-13 MED ORDER — SULFAMETHOXAZOLE-TRIMETHOPRIM 800-160 MG PO TABS: 1.0000 | ORAL_TABLET | Freq: Two times a day (BID) | ORAL | 0 refills | Status: DC

## 2018-05-13 MED ORDER — SULFAMETHOXAZOLE-TRIMETHOPRIM 800-160 MG PO TABS
1.0000 | ORAL_TABLET | Freq: Once | ORAL | Status: AC
  Administered 2018-05-13: 1 via ORAL
  Filled 2018-05-13: qty 1

## 2018-05-13 NOTE — ED Provider Notes (Signed)
[facility] Emergency Department Provider Note ____________________________________________  Time seen: 2220  I have reviewed the triage vital signs and the nursing notes.  HISTORY  Chief Complaint  Abscess  HPI Melissa Coffey is a 22 y.o. female resents herself to the ED for evaluation of a focal abscess to the left lower jaw.  Patient describes an area that was a single pustule about 3 days prior.  She admits to squeezing the area and got a scant amount of purulent drainage.  From that time she had increasing swelling and redness to the cheek at the lower jaw.  She denies any fevers, chills, sweats.  She had a previous episode with a skin cellulitis on the knee that is resolved to a chronic scab at this time.  Past Medical History:  Diagnosis Date  . Anemia   . Depression   . Endometriosis     Patient Active Problem List   Diagnosis Date Noted  . Compulsive skin picking 11/30/2017  . Anxiety and depression 11/29/2017  . Previous cesarean delivery, antepartum condition or complication 05/09/2016  . Status post cesarean delivery 05/09/2016  . Anemia in pregnancy 11/11/2015    Past Surgical History:  Procedure Laterality Date  . ABDOMINAL SURGERY    . CESAREAN SECTION  02/2014   pLTCS. FITL at term  . CESAREAN SECTION N/A 05/09/2016   Procedure: CESAREAN SECTION;  Surgeon: Conard NovakStephen D Jackson, MD;  Location: [facility] ORS;  Service: Obstetrics;  Laterality: N/A;  . NECK SURGERY  2003  . TONSILLECTOMY      Prior to Admission medications   Medication Sig Start Date End Date Taking? Authorizing Provider  ferrous sulfate 325 (65 FE) MG tablet Take 325 mg by mouth 2 (two) times daily with a meal.     [provider]  hydrOXYzine (ATARAX/VISTARIL) 50 MG tablet Take 1 tablet (50 mg total) by mouth 3 (three) times daily as needed for anxiety. 11/30/17   Galen ManilaKennedy, Lauren Renee, NP  levonorgestrel (MIRENA) 20 MCG/24HR IUD 1 each by Intrauterine route once.     [provider]  naproxen (NAPROSYN) 500 MG tablet Take 1 tablet (500 mg total) by mouth 2 (two) times daily with a meal. 02/07/18   Triplett, Cari B, FNP  sertraline (ZOLOFT) 50 MG tablet Take 1 tablet (50 mg total) by mouth daily. 11/30/17   Galen ManilaKennedy, Lauren Renee, NP  sulfamethoxazole-trimethoprim (BACTRIM DS,SEPTRA DS) 800-160 MG tablet Take 1 tablet by mouth 2 (two) times daily. 05/13/18   Anfernee Peschke, Charlesetta IvoryJenise V Bacon, PA-C    Allergies Patient has no known allergies.  Family History  Problem Relation Age of Onset  . Diabetes Maternal Grandmother   . Hypertension Maternal Grandmother   . Cancer Maternal Grandmother   . Breast cancer Maternal Grandmother   . Diabetes Maternal Uncle   . Healthy Mother   . Healthy Brother   . Healthy Daughter   . Healthy Son   . Healthy Brother   . Stroke Neg Hx   . Heart attack Neg Hx   . Colon cancer Neg Hx   . Ovarian cancer Neg Hx     Social History Social History   Tobacco Use  . Smoking status: Former Smoker    Years: 0.50    Last attempt to quit: 01/07/2017    Years since quitting: 1.3  . Smokeless tobacco: Never Used  Substance Use Topics  . Alcohol use: No  . Drug use: No    Review of Systems  Constitutional:  Negative for fever. Eyes: Negative for visual changes. ENT: Negative for sore throat. Cardiovascular: Negative for chest pain. Respiratory: Negative for shortness of breath. Gastrointestinal: Negative for abdominal pain, vomiting and diarrhea. Genitourinary: Negative for dysuria. Musculoskeletal: Negative for back pain. Skin: Negative for rash.  Focal abscess to the left cheek as above. Neurological: Negative for headaches, focal weakness or numbness. ____________________________________________  PHYSICAL EXAM:  VITAL SIGNS: ED Triage Vitals  Enc Vitals Group     BP 05/13/18 2139 118/79     Pulse Rate 05/13/18 2139 (!) 108     Resp 05/13/18 2139 20     Temp 05/13/18 2139 98.2 F (36.8 C)     Temp Source  05/13/18 2139 Oral     SpO2 05/13/18 2139 100 %     Weight 05/13/18 2141 135 lb (61.2 kg)     Height 05/13/18 2141 5\' 3"  (1.6 m)     Head Circumference --      Peak Flow --      Pain Score 05/13/18 2140 0     Pain Loc --      Pain Edu? --      Excl. in GC? --     Constitutional: Alert and oriented. Well appearing and in no distress. Head: Normocephalic and atraumatic except for a small focal abscess to the left cheek at the jawline.  Patient's exam shows a focal area of erythema with a simple blister bed present.  There is no fluctuance or pointing abscess noted.  Patient's exam is consistent with a local cellulitis. Eyes: Conjunctivae are normal. Normal extraocular movements Hematological/Lymphatic/Immunological: No cervical lymphadenopathy. Cardiovascular: Normal rate, regular rhythm. Normal distal pulses. Respiratory: Normal respiratory effort. No wheezes/rales/rhonchi. Musculoskeletal: Nontender with normal range of motion in all extremities.  Neurologic:  Normal gait without ataxia. Normal speech and language. No gross focal neurologic deficits are appreciated. Skin:  Skin is warm, dry and intact. No rash noted. ____________________________________________  PROCEDURES  Procedures Bactrim DS 1 PO ____________________________________________  INITIAL IMPRESSION / ASSESSMENT AND PLAN / ED COURSE  Patient with ED evaluation of a focal cellulitis to the cheek at the jaw. She will be discharged with a prescription for Bactrim. She will follow-up with her provider for wound check. Return precautions have been reviewed.  ____________________________________________  FINAL CLINICAL IMPRESSION(S) / ED DIAGNOSES  Final diagnoses:  Cellulitis of face      Lissa Hoard, PA-C 05/13/18 2255    Don Perking, [place], MD 05/16/18 2225

## 2018-05-13 NOTE — Discharge Instructions (Signed)
Your exam is consistent with a local cellulitis. Take the antibiotic as directed. Apply warm compresses to promote healing. See your provider for wound check as needed.

## 2018-05-13 NOTE — ED Triage Notes (Signed)
Pt presents to ED with knot to the left side of her jaw. Pt states she first noticed the area yesterday but worsened today. Painful to touch. Drainage present with black center. Recent dx of cellulitis to her knee >1 month ago; tx with antibiotics. Denies fever.

## 2018-05-30 DIAGNOSIS — N12 Tubulo-interstitial nephritis, not specified as acute or chronic: Secondary | ICD-10-CM | POA: Diagnosis present

## 2018-05-30 DIAGNOSIS — A419 Sepsis, unspecified organism: Secondary | ICD-10-CM

## 2018-05-30 DIAGNOSIS — K561 Intussusception: Secondary | ICD-10-CM | POA: Diagnosis present

## 2018-05-30 HISTORY — DX: Sepsis, unspecified organism: A41.9

## 2018-05-30 HISTORY — DX: Tubulo-interstitial nephritis, not specified as acute or chronic: N12

## 2018-06-22 ENCOUNTER — Inpatient Hospital Stay: Payer: Managed Care, Other (non HMO)

## 2018-06-22 ENCOUNTER — Other Ambulatory Visit: Payer: Self-pay

## 2018-06-22 ENCOUNTER — Encounter: Payer: Self-pay | Admitting: Emergency Medicine

## 2018-06-22 ENCOUNTER — Emergency Department: Payer: Managed Care, Other (non HMO)

## 2018-06-22 ENCOUNTER — Inpatient Hospital Stay
Admission: EM | Admit: 2018-06-22 | Discharge: 2018-06-25 | DRG: 872 | Disposition: A | Discharge status: Home Health | Payer: Managed Care, Other (non HMO) | Attending: Internal Medicine | Admitting: Internal Medicine

## 2018-06-22 DIAGNOSIS — Z79899 Other long term (current) drug therapy: Secondary | ICD-10-CM | POA: Diagnosis not present

## 2018-06-22 DIAGNOSIS — A4101 Sepsis due to Methicillin susceptible Staphylococcus aureus: Principal | ICD-10-CM | POA: Diagnosis present

## 2018-06-22 DIAGNOSIS — Z791 Long term (current) use of non-steroidal anti-inflammatories (NSAID): Secondary | ICD-10-CM | POA: Diagnosis not present

## 2018-06-22 DIAGNOSIS — Z87440 Personal history of urinary (tract) infections: Secondary | ICD-10-CM | POA: Diagnosis not present

## 2018-06-22 DIAGNOSIS — N809 Endometriosis, unspecified: Secondary | ICD-10-CM | POA: Diagnosis not present

## 2018-06-22 DIAGNOSIS — F17210 Nicotine dependence, cigarettes, uncomplicated: Secondary | ICD-10-CM | POA: Diagnosis present

## 2018-06-22 DIAGNOSIS — R509 Fever, unspecified: Secondary | ICD-10-CM | POA: Diagnosis not present

## 2018-06-22 DIAGNOSIS — A419 Sepsis, unspecified organism: Secondary | ICD-10-CM

## 2018-06-22 DIAGNOSIS — N10 Acute tubulo-interstitial nephritis: Secondary | ICD-10-CM | POA: Diagnosis present

## 2018-06-22 DIAGNOSIS — D638 Anemia in other chronic diseases classified elsewhere: Secondary | ICD-10-CM | POA: Diagnosis present

## 2018-06-22 DIAGNOSIS — Z975 Presence of (intrauterine) contraceptive device: Secondary | ICD-10-CM | POA: Diagnosis not present

## 2018-06-22 DIAGNOSIS — B9561 Methicillin susceptible Staphylococcus aureus infection as the cause of diseases classified elsewhere: Secondary | ICD-10-CM | POA: Diagnosis not present

## 2018-06-22 DIAGNOSIS — F418 Other specified anxiety disorders: Secondary | ICD-10-CM | POA: Diagnosis present

## 2018-06-22 DIAGNOSIS — E871 Hypo-osmolality and hyponatremia: Secondary | ICD-10-CM | POA: Diagnosis present

## 2018-06-22 DIAGNOSIS — N12 Tubulo-interstitial nephritis, not specified as acute or chronic: Secondary | ICD-10-CM | POA: Diagnosis present

## 2018-06-22 DIAGNOSIS — I471 Supraventricular tachycardia: Secondary | ICD-10-CM | POA: Diagnosis present

## 2018-06-22 DIAGNOSIS — R17 Unspecified jaundice: Secondary | ICD-10-CM | POA: Diagnosis present

## 2018-06-22 DIAGNOSIS — F424 Excoriation (skin-picking) disorder: Secondary | ICD-10-CM | POA: Diagnosis not present

## 2018-06-22 DIAGNOSIS — F329 Major depressive disorder, single episode, unspecified: Secondary | ICD-10-CM | POA: Diagnosis not present

## 2018-06-22 DIAGNOSIS — Z872 Personal history of diseases of the skin and subcutaneous tissue: Secondary | ICD-10-CM | POA: Diagnosis not present

## 2018-06-22 DIAGNOSIS — R7881 Bacteremia: Secondary | ICD-10-CM | POA: Diagnosis not present

## 2018-06-22 DIAGNOSIS — B9620 Unspecified Escherichia coli [E. coli] as the cause of diseases classified elsewhere: Secondary | ICD-10-CM | POA: Diagnosis not present

## 2018-06-22 DIAGNOSIS — R000 Tachycardia, unspecified: Secondary | ICD-10-CM | POA: Diagnosis not present

## 2018-06-22 DIAGNOSIS — L739 Follicular disorder, unspecified: Secondary | ICD-10-CM | POA: Diagnosis not present

## 2018-06-22 HISTORY — DX: Urinary tract infection, site not specified: N39.0

## 2018-06-22 LAB — CBC
HCT: 39.8 % (ref 35.0–47.0)
Hemoglobin: 13.5 g/dL (ref 12.0–16.0)
MCH: 28.2 pg (ref 26.0–34.0)
MCHC: 33.9 g/dL (ref 32.0–36.0)
MCV: 83.2 fL (ref 80.0–100.0)
PLATELETS: 303 10*3/uL (ref 150–440)
RBC: 4.78 MIL/uL (ref 3.80–5.20)
RDW: 15.6 % — AB (ref 11.5–14.5)
WBC: 16.2 10*3/uL — AB (ref 3.6–11.0)

## 2018-06-22 LAB — COMPREHENSIVE METABOLIC PANEL
ALK PHOS: 76 U/L (ref 38–126)
ALT: 16 U/L (ref 0–44)
AST: 16 U/L (ref 15–41)
Albumin: 4 g/dL (ref 3.5–5.0)
Anion gap: 8 (ref 5–15)
BILIRUBIN TOTAL: 1.6 mg/dL — AB (ref 0.3–1.2)
BUN: 7 mg/dL (ref 6–20)
CALCIUM: 9.1 mg/dL (ref 8.9–10.3)
CHLORIDE: 102 mmol/L (ref 98–111)
CO2: 24 mmol/L (ref 22–32)
CREATININE: 0.55 mg/dL (ref 0.44–1.00)
GFR calc Af Amer: >60 mL/min (ref >60)
Glucose, Bld: 120 mg/dL — ABNORMAL HIGH (ref 70–99)
Potassium: 3.5 mmol/L (ref 3.5–5.1)
Sodium: 134 mmol/L — ABNORMAL LOW (ref 135–145)
Total Protein: 7.8 g/dL (ref 6.5–8.1)

## 2018-06-22 LAB — URINALYSIS, COMPLETE (UACMP) WITH MICROSCOPIC
BILIRUBIN URINE: NEGATIVE
GLUCOSE, UA: NEGATIVE mg/dL
Ketones, ur: NEGATIVE mg/dL
Nitrite: POSITIVE — AB
PH: 7 (ref 5.0–8.0)
Protein, ur: 30 mg/dL — AB
SPECIFIC GRAVITY, URINE: 1.011 (ref 1.005–1.030)
WBC, UA: >50 WBC/hpf — ABNORMAL HIGH (ref 0–5)

## 2018-06-22 LAB — POCT PREGNANCY, URINE: PREG TEST UR: NEGATIVE

## 2018-06-22 LAB — PROCALCITONIN: PROCALCITONIN: 0.16 ng/mL

## 2018-06-22 LAB — APTT: APTT: 37 s — AB (ref 24–36)

## 2018-06-22 LAB — PROTIME-INR
INR: 1.06
PROTHROMBIN TIME: 13.7 s (ref 11.4–15.2)

## 2018-06-22 LAB — LACTIC ACID, PLASMA
Lactic Acid, Venous: 0.9 mmol/L (ref 0.5–1.9)
Lactic Acid, Venous: 1 mmol/L (ref 0.5–1.9)

## 2018-06-22 MED ORDER — SODIUM CHLORIDE 0.9 % IV SOLN
1.0000 g | INTRAVENOUS | Status: DC
  Filled 2018-06-22: qty 10

## 2018-06-22 MED ORDER — IOPAMIDOL (ISOVUE-300) INJECTION 61%
15.0000 mL | INTRAVENOUS | Status: AC
  Administered 2018-06-22 (×2): 15 mL via ORAL

## 2018-06-22 MED ORDER — SERTRALINE HCL 50 MG PO TABS
50.0000 mg | ORAL_TABLET | Freq: Every day | ORAL | Status: DC
  Administered 2018-06-22 – 2018-06-24 (×2): 50 mg via ORAL
  Filled 2018-06-22 (×2): qty 1

## 2018-06-22 MED ORDER — BISACODYL 5 MG PO TBEC: 5.0000 mg | DELAYED_RELEASE_TABLET | Freq: Every day | ORAL | Status: DC | PRN

## 2018-06-22 MED ORDER — SODIUM CHLORIDE 0.9 % IV BOLUS
1000.0000 mL | Freq: Once | INTRAVENOUS | Status: AC
  Administered 2018-06-22: 1000 mL via INTRAVENOUS

## 2018-06-22 MED ORDER — SODIUM CHLORIDE 0.9 % IV SOLN
INTRAVENOUS | Status: DC
  Administered 2018-06-22 – 2018-06-24 (×5): via INTRAVENOUS

## 2018-06-22 MED ORDER — ACETAMINOPHEN 325 MG PO TABS
650.0000 mg | ORAL_TABLET | Freq: Four times a day (QID) | ORAL | Status: DC | PRN
  Administered 2018-06-22: 650 mg via ORAL
  Filled 2018-06-22: qty 2

## 2018-06-22 MED ORDER — METOPROLOL TARTRATE 5 MG/5ML IV SOLN
5.0000 mg | INTRAVENOUS | Status: DC | PRN
  Filled 2018-06-22: qty 5

## 2018-06-22 MED ORDER — ACETAMINOPHEN 650 MG RE SUPP: 650.0000 mg | Freq: Four times a day (QID) | RECTAL | Status: DC | PRN

## 2018-06-22 MED ORDER — SENNOSIDES-DOCUSATE SODIUM 8.6-50 MG PO TABS: 1.0000 | ORAL_TABLET | Freq: Every evening | ORAL | Status: DC | PRN

## 2018-06-22 MED ORDER — MORPHINE SULFATE (PF) 4 MG/ML IV SOLN
4.0000 mg | Freq: Once | INTRAVENOUS | Status: AC
  Administered 2018-06-22: 4 mg via INTRAVENOUS
  Filled 2018-06-22: qty 1

## 2018-06-22 MED ORDER — ONDANSETRON HCL 4 MG PO TABS: 4.0000 mg | ORAL_TABLET | Freq: Four times a day (QID) | ORAL | Status: DC | PRN

## 2018-06-22 MED ORDER — SODIUM CHLORIDE 0.9 % IV SOLN
1000.0000 mL | Freq: Once | INTRAVENOUS | Status: AC
  Administered 2018-06-22: 1000 mL via INTRAVENOUS

## 2018-06-22 MED ORDER — IOPAMIDOL (ISOVUE-300) INJECTION 61%
100.0000 mL | Freq: Once | INTRAVENOUS | Status: AC | PRN
  Administered 2018-06-22: 100 mL via INTRAVENOUS

## 2018-06-22 MED ORDER — ENOXAPARIN SODIUM 40 MG/0.4ML ~~LOC~~ SOLN
40.0000 mg | SUBCUTANEOUS | Status: DC
  Administered 2018-06-22 – 2018-06-24 (×3): 40 mg via SUBCUTANEOUS
  Filled 2018-06-22 (×3): qty 0.4

## 2018-06-22 MED ORDER — ONDANSETRON HCL 4 MG/2ML IJ SOLN
4.0000 mg | Freq: Once | INTRAMUSCULAR | Status: AC
  Administered 2018-06-22: 4 mg via INTRAVENOUS
  Filled 2018-06-22: qty 2

## 2018-06-22 MED ORDER — SODIUM CHLORIDE 0.9 % IV SOLN
1.0000 g | Freq: Once | INTRAVENOUS | Status: AC
  Administered 2018-06-22: 1 g via INTRAVENOUS
  Filled 2018-06-22: qty 10

## 2018-06-22 MED ORDER — ONDANSETRON HCL 4 MG/2ML IJ SOLN: 4.0000 mg | Freq: Four times a day (QID) | INTRAMUSCULAR | Status: DC | PRN

## 2018-06-22 MED ORDER — FERROUS SULFATE 325 (65 FE) MG PO TABS
325.0000 mg | ORAL_TABLET | Freq: Two times a day (BID) | ORAL | Status: DC
  Administered 2018-06-24: 325 mg via ORAL
  Filled 2018-06-22: qty 1

## 2018-06-22 MED ORDER — HYDROCODONE-ACETAMINOPHEN 5-325 MG PO TABS
1.0000 | ORAL_TABLET | ORAL | Status: DC | PRN
  Administered 2018-06-24 (×2): 1 via ORAL
  Filled 2018-06-22 (×2): qty 1

## 2018-06-22 MED ORDER — ALBUTEROL SULFATE (2.5 MG/3ML) 0.083% IN NEBU: 2.5000 mg | INHALATION_SOLUTION | RESPIRATORY_TRACT | Status: DC | PRN

## 2018-06-22 NOTE — ED Triage Notes (Signed)
L flank pain x 3 days.  

## 2018-06-22 NOTE — ED Provider Notes (Signed)
[facility] Emergency Department Provider Note   ____________________________________________    I have reviewed the triage vital signs and the nursing notes.   HISTORY  Chief Complaint Flank Pain     HPI Melissa Coffey is a 22 y.o. female presents with complaints of left flank pain over the last 3 days which has worsened.  Patient reports she feels weak and nauseated and the pain is moderate to severe and sharp in nature.  Positive chills, has not checked her temperature.  Reports a history of UTIs in the past.  Does have dysuria.  No recent travel.  Has not taken anything for this.  Nothing seems to make it better  Past Medical History:  Diagnosis Date  . Anemia   . Depression   . Endometriosis     Patient Active Problem List   Diagnosis Date Noted  . Compulsive skin picking 11/30/2017  . Anxiety and depression 11/29/2017  . Previous cesarean delivery, antepartum condition or complication 05/09/2016  . Status post cesarean delivery 05/09/2016  . Anemia in pregnancy 11/11/2015    Past Surgical History:  Procedure Laterality Date  . ABDOMINAL SURGERY    . CESAREAN SECTION  02/2014   pLTCS. FITL at term  . CESAREAN SECTION N/A 05/09/2016   Procedure: CESAREAN SECTION;  Surgeon: Conard NovakStephen D Jackson, MD;  Location: [facility] ORS;  Service: Obstetrics;  Laterality: N/A;  . NECK SURGERY  2003  . TONSILLECTOMY      Prior to Admission medications   Medication Sig Start Date End Date Taking? Authorizing Provider  ferrous sulfate 325 (65 FE) MG tablet Take 325 mg by mouth 2 (two) times daily with a meal.     [provider]  hydrOXYzine (ATARAX/VISTARIL) 50 MG tablet Take 1 tablet (50 mg total) by mouth 3 (three) times daily as needed for anxiety. 11/30/17   Galen ManilaKennedy, Lauren Renee, NP  levonorgestrel (MIRENA) 20 MCG/24HR IUD 1 each by Intrauterine route once.    [provider]  naproxen (NAPROSYN) 500 MG tablet Take 1 tablet (500 mg  total) by mouth 2 (two) times daily with a meal. 02/07/18   Triplett, Cari B, FNP  sertraline (ZOLOFT) 50 MG tablet Take 1 tablet (50 mg total) by mouth daily. 11/30/17   Galen ManilaKennedy, Lauren Renee, NP  sulfamethoxazole-trimethoprim (BACTRIM DS,SEPTRA DS) 800-160 MG tablet Take 1 tablet by mouth 2 (two) times daily. 05/13/18   Menshew, Charlesetta IvoryJenise V Bacon, PA-C     Allergies Patient has no known allergies.  Family History  Problem Relation Age of Onset  . Diabetes Maternal Grandmother   . Hypertension Maternal Grandmother   . Cancer Maternal Grandmother   . Breast cancer Maternal Grandmother   . Diabetes Maternal Uncle   . Healthy Mother   . Healthy Brother   . Healthy Daughter   . Healthy Son   . Healthy Brother   . Stroke Neg Hx   . Heart attack Neg Hx   . Colon cancer Neg Hx   . Ovarian cancer Neg Hx     Social History Social History   Tobacco Use  . Smoking status: Current Every Day Smoker    Packs/day: 0.50    Years: 0.50    Pack years: 0.25    Types: Cigarettes    Last attempt to quit: 01/07/2017    Years since quitting: 1.4  . Smokeless tobacco: Never Used  Substance Use Topics  . Alcohol use: No  . Drug use: No  Review of Systems  Constitutional: Chills Eyes: No visual changes.  ENT: No sore throat. Cardiovascular: Denies chest pain. Respiratory: Denies shortness of breath. Gastrointestinal: As above Genitourinary: Positive dysuria Musculoskeletal: Negative for back pain. Skin: Negative for rash. Neurological: Negative for headaches or weakness   ____________________________________________   PHYSICAL EXAM:  VITAL SIGNS: ED Triage Vitals  Enc Vitals Group     BP 06/22/18 1455 103/75     Pulse Rate 06/22/18 1455 (!) 163     Resp 06/22/18 1455 20     Temp 06/22/18 1455 99.8 F (37.7 C)     Temp Source 06/22/18 1455 Oral     SpO2 06/22/18 1455 100 %     Weight 06/22/18 1457 59.9 kg (132 lb)     Height 06/22/18 1457 1.626 m (5\' 4" )     Head  Circumference --      Peak Flow --      Pain Score 06/22/18 1457 8     Pain Loc --      Pain Edu? --      Excl. in GC? --     Constitutional: Alert and oriented.  Ill-appearing Eyes: Conjunctivae are normal.   Nose: No congestion/rhinnorhea. Mouth/Throat: Mucous membranes are moist.   Neck:  Painless ROM Cardiovascular: Significant tachycardia, regular rhythm. Grossly normal heart sounds.  Good peripheral circulation. Respiratory: Normal respiratory effort.  No retractions. Lungs CTAB. Gastrointestinal: Soft and nontender. No distention.  Severe left CVA tenderness  Musculoskeletal:   Warm and well perfused Neurologic:  Normal speech and language. No gross focal neurologic deficits are appreciated.  Skin:  Skin is warm, dry and intact. No rash noted. Psychiatric: Mood and affect are normal. Speech and behavior are normal.  ____________________________________________   LABS (all labs ordered are listed, but only abnormal results are displayed)  Labs Reviewed  COMPREHENSIVE METABOLIC PANEL - Abnormal; Notable for the following components:      Result Value   Sodium 134 (*)    Glucose, Bld 120 (*)    Total Bilirubin 1.6 (*)    All other components within normal limits  CBC - Abnormal; Notable for the following components:   WBC 16.2 (*)    RDW 15.6 (*)    All other components within normal limits  URINALYSIS, COMPLETE (UACMP) WITH MICROSCOPIC - Abnormal; Notable for the following components:   Color, Urine YELLOW (*)    APPearance CLOUDY (*)    Hgb urine dipstick SMALL (*)    Protein, ur 30 (*)    Nitrite POSITIVE (*)    Leukocytes, UA LARGE (*)    WBC, UA >50 (*)    Bacteria, UA MANY (*)    All other components within normal limits  URINE CULTURE  CULTURE, BLOOD (ROUTINE X 2)  CULTURE, BLOOD (ROUTINE X 2)  LACTIC ACID, PLASMA  LACTIC ACID, PLASMA  POC URINE PREG, ED  POCT PREGNANCY, URINE    ____________________________________________  EKG  None ____________________________________________  RADIOLOGY  CT renal stone study demonstrates edematous left kidney consistent with pyelonephritis Radiologist also requested repeat CT scan with IV and p.o. contrast because of diffusely thickened small bowel ____________________________________________   PROCEDURES  Procedure(s) performed: No  Procedures   Critical Care performed: yes  CRITICAL CARE Performed by: Jene Every   Total critical care time: 30 minutes  Critical care time was exclusive of separately billable procedures and treating other patients.  Critical care was necessary to treat or prevent imminent or life-threatening deterioration.  Critical care was  time spent personally by me on the following activities: development of treatment plan with patient and/or surrogate as well as nursing, discussions with consultants, evaluation of patient's response to treatment, examination of patient, obtaining history from patient or surrogate, ordering and performing treatments and interventions, ordering and review of laboratory studies, ordering and review of radiographic studies, pulse oximetry and re-evaluation of patient's condition.  ____________________________________________   INITIAL IMPRESSION / ASSESSMENT AND PLAN / ED COURSE  Pertinent labs & imaging results that were available during my care of the patient were reviewed by me and considered in my medical decision making (see chart for details).  Patient presents with left flank pain, tachycardia of 163 as well as fever.  She reports dysuria, this is consistent with pyelonephritis/sepsis.  IV Rocephin started immediately, IV fluids ordered.  Will send for CT to rule out infected stone  CT demonstrate edematous left kidney, no stone, radiologist requests CT with IV and p.o. contrast for diffusely thickened small bowel  Patient's heart rate is  improved with IV fluids, morphine and Zofran ordered.  Admitted to the hospitalist service    ____________________________________________   FINAL CLINICAL IMPRESSION(S) / ED DIAGNOSES  Final diagnoses:  Pyelonephritis  Sepsis, due to unspecified organism [facility])        Note:  This document was prepared using Dragon voice recognition software and may include unintentional dictation errors.    Jene Every, MD 06/22/18 1655

## 2018-06-22 NOTE — H&P (Addendum)
[facility]   PATIENT NAME: Melissa Coffey    MR#:  [id number]  DATE OF BIRTH:  22-Jul-1996  DATE OF ADMISSION:  06/22/2018  PRIMARY CARE PHYSICIAN: Patient, No Pcp Per   REQUESTING/REFERRING PHYSICIAN: Dr. Cyril Loosen  CHIEF COMPLAINT:   Chief Complaint  Patient presents with  . Flank Pain   Left-sided abdominal pain and flank pain for 3 days. HISTORY OF PRESENT ILLNESS:  Melissa Coffey  is a 22 y.o. female with a known history of current UTI, anemia, endometriosis and depression.  The patient presents to the ED with above chief complaints.  The patient has had fever, chills, left-sided abdominal pain and flank pain for the past 3 days.  Abdominal pain is on the left side, constant, 8 out of 10 with radiation to her left flank.  The patient has nausea but no vomiting or diarrhea, no melena or bloody stool.  She has some urinary frequency but denies any dysuria or hematuria.  She was found tachycardia, leukocytosis and urinalysis showed UTI.  CAT scan of the abdomen showed left side pyelonephritis and diffusely thickened small bowel. PAST MEDICAL HISTORY:   Past Medical History:  Diagnosis Date  . Anemia   . Depression   . Endometriosis   . UTI (urinary tract infection)     PAST SURGICAL HISTORY:   Past Surgical History:  Procedure Laterality Date  . ABDOMINAL SURGERY    . CESAREAN SECTION  02/2014   pLTCS. FITL at term  . CESAREAN SECTION N/A 05/09/2016   Procedure: CESAREAN SECTION;  Surgeon: Conard Novak, MD;  Location: [facility] ORS;  Service: Obstetrics;  Laterality: N/A;  . NECK SURGERY  2003  . TONSILLECTOMY      SOCIAL HISTORY:   Social History   Tobacco Use  . Smoking status: Current Every Day Smoker    Packs/day: 0.50    Years: 0.50    Pack years: 0.25    Types: Cigarettes    Last attempt to quit: 01/07/2017    Years since quitting: 1.4  . Smokeless tobacco: Never Used  Substance Use Topics  . Alcohol use: No     FAMILY HISTORY:   Family History  Problem Relation Age of Onset  . Diabetes Maternal Grandmother   . Hypertension Maternal Grandmother   . Cancer Maternal Grandmother   . Breast cancer Maternal Grandmother   . Diabetes Maternal Uncle   . Healthy Mother   . Healthy Brother   . Healthy Daughter   . Healthy Son   . Healthy Brother   . Stroke Neg Hx   . Heart attack Neg Hx   . Colon cancer Neg Hx   . Ovarian cancer Neg Hx     DRUG ALLERGIES:  No Known Allergies  REVIEW OF SYSTEMS:   Review of Systems  Constitutional: Positive for chills and fever. Negative for malaise/fatigue.  HENT: Negative for sore throat.   Eyes: Negative for blurred vision and double vision.  Respiratory: Negative for cough, hemoptysis, shortness of breath, wheezing and stridor.   Cardiovascular: Negative for chest pain, palpitations, orthopnea and leg swelling.  Gastrointestinal: Positive for nausea. Negative for abdominal pain, blood in stool, constipation, diarrhea, melena and vomiting.  Genitourinary: Positive for flank pain and frequency. Negative for dysuria, hematuria and urgency.  Musculoskeletal: Negative for back pain and joint pain.  Neurological: Negative for dizziness, sensory change, focal weakness, seizures, loss of consciousness, weakness and headaches.  Endo/Heme/Allergies: Negative for polydipsia.  Psychiatric/Behavioral:  Negative for depression. The patient is not nervous/anxious.     MEDICATIONS AT HOME:   Prior to Admission medications   Medication Sig Start Date End Date Taking? Authorizing Provider  ferrous sulfate 325 (65 FE) MG tablet Take 325 mg by mouth 2 (two) times daily with a meal.     [provider]  hydrOXYzine (ATARAX/VISTARIL) 50 MG tablet Take 1 tablet (50 mg total) by mouth 3 (three) times daily as needed for anxiety. Patient not taking: Reported on 06/22/2018 11/30/17   Galen Manila, NP  naproxen (NAPROSYN) 500 MG tablet Take 1 tablet (500 mg  total) by mouth 2 (two) times daily with a meal. Patient not taking: Reported on 06/22/2018 02/07/18   Kem Boroughs B, FNP  sertraline (ZOLOFT) 50 MG tablet Take 1 tablet (50 mg total) by mouth daily. Patient not taking: Reported on 06/22/2018 11/30/17   Galen Manila, NP  sulfamethoxazole-trimethoprim (BACTRIM DS,SEPTRA DS) 800-160 MG tablet Take 1 tablet by mouth 2 (two) times daily. Patient not taking: Reported on 06/22/2018 05/13/18   Menshew, Charlesetta Ivory, PA-C      VITAL SIGNS:  Blood pressure 119/80, pulse (!) 106, temperature 99.8 F (37.7 C), temperature source Oral, resp. rate 20, height 5\' 4"  (1.626 m), weight 59.9 kg, last menstrual period 06/08/2018, SpO2 100 %, unknown if currently breastfeeding.  PHYSICAL EXAMINATION:  Physical Exam  GENERAL:  22 y.o.-year-old patient lying in the bed with no acute distress.  EYES: Pupils equal, round, reactive to light and accommodation. No scleral icterus. Extraocular muscles intact.  HEENT: Head atraumatic, normocephalic. Oropharynx and nasopharynx clear.  NECK:  Supple, no jugular venous distention. No thyroid enlargement, no tenderness.  LUNGS: Normal breath sounds bilaterally, no wheezing, rales,rhonchi or crepitation. No use of accessory muscles of respiration.  CARDIOVASCULAR: S1, S2 normal. No murmurs, rubs, or gallops.  ABDOMEN: Soft, tenderness on the left side, nondistended. Bowel sounds present. No organomegaly or mass. Left CVA tenderness. EXTREMITIES: No pedal edema, cyanosis, or clubbing.  NEUROLOGIC: Cranial nerves II through XII are intact. Muscle strength 5/5 in all extremities. Sensation intact. Gait not checked.  PSYCHIATRIC: The patient is alert and oriented x 3.  SKIN: No obvious rash, lesion, or ulcer.   LABORATORY PANEL:   CBC Recent Labs  Lab 06/22/18 1500  WBC 16.2*  HGB 13.5  HCT 39.8  PLT 303    ------------------------------------------------------------------------------------------------------------------  Chemistries  Recent Labs  Lab 06/22/18 1500  NA 134*  K 3.5  CL 102  CO2 24  GLUCOSE 120*  BUN 7  CREATININE 0.55  CALCIUM 9.1  AST 16  ALT 16  ALKPHOS 76  BILITOT 1.6*   ------------------------------------------------------------------------------------------------------------------  Cardiac Enzymes No results for input(s): TROPONINI in the last 168 hours. ------------------------------------------------------------------------------------------------------------------  RADIOLOGY:  Ct Renal Stone Study  Result Date: 06/22/2018 CLINICAL DATA:  22 year old with left lower abdominal and flank pain for 3 days. EXAM: CT ABDOMEN AND PELVIS WITHOUT CONTRAST TECHNIQUE: Multidetector CT imaging of the abdomen and pelvis was performed following the standard protocol without IV contrast. COMPARISON:  None. FINDINGS: Lower chest: Lung bases are clear. Hepatobiliary: Normal appearance of the liver and gallbladder. Pancreas: Unremarkable. No pancreatic ductal dilatation or surrounding inflammatory changes. Spleen: Spleen is mildly prominent for size but no gross abnormality. Adrenals/Urinary Tract: Adrenal glands are poorly characterized on this noncontrast examination. Normal appearance of the right kidney without hydronephrosis or stones. Difficult to exclude mild urinary bladder wall thickening. Inflammatory changes around the left kidney without significant hydronephrosis.  No evidence for kidney stones. No evidence for a left ureter stone. Stomach/Bowel: There is high-density material within the appendix, possibly representing an appendicolith. No evidence for acute appendix inflammation. Unusual appearance of small bowel loops in the left abdomen. This is best seen on sequence 2, image 45. Findings raise concern for marked wall thickening in this area and cannot exclude a small  bowel intussusception in this area. No gross abnormality to the stomach or duodenum. No evidence for a bowel obstruction. Vascular/Lymphatic: Limited evaluation of the vascular structures on this noncontrast examination. There are small lymph nodes in the inguinal regions. Stranding and mildly prominent left periaortic lymph nodes which could be reactive from the changes in left kidney. Reproductive: Uterus and bilateral adnexa are unremarkable. Other: Small amount of free fluid in the pelvis. Negative for free air. Musculoskeletal: No acute abnormality. IMPRESSION: Inflammatory changes involving the left kidney without stones or significant hydronephrosis. Findings are suggestive for left pyelonephritis. Cannot exclude mild bladder wall thickening. Abnormal appearance of small bowel loops in the left abdomen. Cannot exclude significant bowel wall thickening and/or intussusception in this area. Recommend a follow-up CT of the abdomen and pelvis with IV and oral contrast to better characterize the small bowel and the left kidney. Small amount of free fluid in the pelvis which could be related to inflammatory changes in the abdomen or physiologic. These results were called by telephone at the time of interpretation on 06/22/2018 at 4:21 pm to Dr. Jene EveryOBERT KINNER , who verbally acknowledged these results. Electronically Signed   By: Richarda OverlieAdam  Henn M.D.   On: 06/22/2018 16:26      IMPRESSION AND PLAN:   Sepsis due to acute pyelonephritis The patient will be admitted to medical floor. Continue sepsis protocol, continue Rocephin, IV fluid support, pain control, follow-up CBC and cultures.  Diffusely thickened small bowel.  Follow-up CAT scan of the abdomen pelvis with contrast.  Hyponatremia.  Normal saline IV and follow-up BMP. Mild elevated bilirubin.  Possible due to above.  Follow-up level. Tobacco abuse.  Smoking cessation was counseled for 3-4 minutes.  All the records are reviewed and case discussed with  ED provider. Management plans discussed with the patient, family and they are in agreement.  CODE STATUS: Full code.  TOTAL TIME TAKING CARE OF THIS PATIENT: 42 minutes.    Shaune PollackQing Seerat Peaden M.D on 06/22/2018 at 4:56 PM  Between 7am to 6pm - Pager - [phone]  After 6pm go to [email or web address] - Social research officer, governmentpassword [email or web address] [facility]  [facility] [place] [facility]  Office  [phone]  CC: Primary care physician; Patient, No Pcp Per   Note: This dictation was prepared with Dragon dictation along with smaller phrase technology. Any transcriptional errors that result from this process are unin

## 2018-06-22 NOTE — Progress Notes (Signed)
Pt HR ranging from 146 to 150. Dr. Imogene Burnhen made aware. No new orders at this time.

## 2018-06-22 NOTE — Progress Notes (Signed)
PT HR in 160s to 170s. Dr Anne Hahnwillis made aware. Order for EKG obtained.

## 2018-06-23 ENCOUNTER — Inpatient Hospital Stay (HOSPITAL_COMMUNITY)
Admit: 2018-06-23 | Discharge: 2018-06-23 | Disposition: A | Discharge status: Home / Self Care | Payer: Managed Care, Other (non HMO) | Attending: Internal Medicine | Admitting: Internal Medicine

## 2018-06-23 DIAGNOSIS — B9561 Methicillin susceptible Staphylococcus aureus infection as the cause of diseases classified elsewhere: Secondary | ICD-10-CM

## 2018-06-23 DIAGNOSIS — R000 Tachycardia, unspecified: Secondary | ICD-10-CM

## 2018-06-23 DIAGNOSIS — R7881 Bacteremia: Secondary | ICD-10-CM

## 2018-06-23 DIAGNOSIS — N12 Tubulo-interstitial nephritis, not specified as acute or chronic: Secondary | ICD-10-CM

## 2018-06-23 DIAGNOSIS — A419 Sepsis, unspecified organism: Secondary | ICD-10-CM

## 2018-06-23 DIAGNOSIS — R509 Fever, unspecified: Secondary | ICD-10-CM

## 2018-06-23 LAB — BLOOD CULTURE ID PANEL (REFLEXED)
ACINETOBACTER BAUMANNII: NOT DETECTED
CANDIDA ALBICANS: NOT DETECTED
CANDIDA GLABRATA: NOT DETECTED
CANDIDA KRUSEI: NOT DETECTED
Candida parapsilosis: NOT DETECTED
Candida tropicalis: NOT DETECTED
ENTEROBACTERIACEAE SPECIES: NOT DETECTED
ESCHERICHIA COLI: NOT DETECTED
Enterobacter cloacae complex: NOT DETECTED
Enterococcus species: NOT DETECTED
Haemophilus influenzae: NOT DETECTED
KLEBSIELLA OXYTOCA: NOT DETECTED
Klebsiella pneumoniae: NOT DETECTED
Listeria monocytogenes: NOT DETECTED
Methicillin resistance: NOT DETECTED
Neisseria meningitidis: NOT DETECTED
PSEUDOMONAS AERUGINOSA: NOT DETECTED
Proteus species: NOT DETECTED
SERRATIA MARCESCENS: NOT DETECTED
STREPTOCOCCUS PNEUMONIAE: NOT DETECTED
STREPTOCOCCUS PYOGENES: NOT DETECTED
Staphylococcus aureus (BCID): DETECTED — AB
Staphylococcus species: DETECTED — AB
Streptococcus agalactiae: NOT DETECTED
Streptococcus species: NOT DETECTED

## 2018-06-23 LAB — CBC
HCT: 31.7 % — ABNORMAL LOW (ref 35.0–47.0)
HEMOGLOBIN: 11 g/dL — AB (ref 12.0–16.0)
MCH: 28.4 pg (ref 26.0–34.0)
MCHC: 34.7 g/dL (ref 32.0–36.0)
MCV: 82 fL (ref 80.0–100.0)
Platelets: 242 10*3/uL (ref 150–440)
RBC: 3.86 MIL/uL (ref 3.80–5.20)
RDW: 15.4 % — ABNORMAL HIGH (ref 11.5–14.5)
WBC: 15 10*3/uL — ABNORMAL HIGH (ref 3.6–11.0)

## 2018-06-23 LAB — BASIC METABOLIC PANEL
ANION GAP: 6 (ref 5–15)
BUN: 7 mg/dL (ref 6–20)
CALCIUM: 7.8 mg/dL — AB (ref 8.9–10.3)
CO2: 22 mmol/L (ref 22–32)
Chloride: 109 mmol/L (ref 98–111)
Creatinine, Ser: 0.35 mg/dL — ABNORMAL LOW (ref 0.44–1.00)
Glucose, Bld: 134 mg/dL — ABNORMAL HIGH (ref 70–99)
Potassium: 3.5 mmol/L (ref 3.5–5.1)
Sodium: 137 mmol/L (ref 135–145)

## 2018-06-23 LAB — ECHOCARDIOGRAM COMPLETE
Height: 64 in
WEIGHTICAEL: 2112 [oz_av]

## 2018-06-23 LAB — MAGNESIUM: MAGNESIUM: 1.7 mg/dL (ref 1.7–2.4)

## 2018-06-23 MED ORDER — METOPROLOL TARTRATE 5 MG/5ML IV SOLN
5.0000 mg | Freq: Once | INTRAVENOUS | Status: AC
  Administered 2018-06-23: 5 mg via INTRAVENOUS

## 2018-06-23 MED ORDER — KETOROLAC TROMETHAMINE 30 MG/ML IJ SOLN
30.0000 mg | Freq: Four times a day (QID) | INTRAMUSCULAR | Status: DC | PRN
  Administered 2018-06-23 (×3): 30 mg via INTRAVENOUS
  Filled 2018-06-23 (×3): qty 1

## 2018-06-23 MED ORDER — SODIUM CHLORIDE 0.9 % IV SOLN
2.0000 g | INTRAVENOUS | Status: DC
  Administered 2018-06-23: 2 g via INTRAVENOUS
  Filled 2018-06-23 (×2): qty 20

## 2018-06-23 MED ORDER — MAGNESIUM SULFATE IN D5W 1-5 GM/100ML-% IV SOLN
1.0000 g | Freq: Once | INTRAVENOUS | Status: AC
  Administered 2018-06-23: 1 g via INTRAVENOUS
  Filled 2018-06-23: qty 100

## 2018-06-23 NOTE — Progress Notes (Signed)
PHARMACY - PHYSICIAN COMMUNICATION CRITICAL VALUE ALERT - BLOOD CULTURE IDENTIFICATION (BCID)  Melissa RutherfordHarley R Coffey is an 22 y.o. female who presented to [facility] on 06/22/2018 with a chief complaint of flank pain with tachycardia and chills. Patient is suspected to have pyelonephritis/sepsis.   Assessment: BCID cultures resulted showing 1 out of 4 bottles growing MSSA. Likely contaminant.   Name of physician (or Provider) Contacted: Dr. Nemiah CommanderKalisetti   Current antibiotics: Ceftriaxone 1g Q24H  Changes to prescribed antibiotics recommended:  Recommended no changes at this time as patient is being treated for pyelonephritis with Ceftriaxone and Ceftriaxone will cover MSSA if it is truly not a contaminant. MD accepted recommendation.   Results for orders placed or performed during the hospital encounter of 06/22/18  Blood culture (routine x 2)     Status: None (Preliminary result)   Collection Time: 06/22/18  3:00 PM  Result Value Ref Range Status   Specimen Description BLOOD RA  Final   Special Requests   Final    BOTTLES DRAWN AEROBIC AND ANAEROBIC Blood Culture adequate volume   Culture   Final    NO GROWTH < 24 HOURS Performed at [facility], [address] [place], [place] [address]    Report Status PENDING  Incomplete  Blood culture (routine x 2)     Status: None (Preliminary result)   Collection Time: 06/22/18  4:15 PM  Result Value Ref Range Status   Specimen Description BLOOD LEFT ANTECUBITAL  Final   Special Requests   Final    BOTTLES DRAWN AEROBIC AND ANAEROBIC Blood Culture adequate volume   Culture  Setup Time   Final    Organism ID to follow GRAM POSITIVE COCCI ANAEROBIC BOTTLE ONLY CRITICAL RESULT CALLED TO, READ BACK BY AND VERIFIED WITHVerline Lema: HANNAH LIPSEE AT [id number] ON 06/23/2018 JJB Performed at [facility], [address] [place], [place] [address]    Culture [facility]GRAM POSITIVE COCCI  Final   Report Status PENDING  Incomplete     No results found for  this or any previous visit.  Yolanda BonineHannah Lifsey, PharmD 06/23/2018  9:07 AM

## 2018-06-23 NOTE — Consult Note (Signed)
[facility] alert for positive MSSA blood culture.   On ceftriaxone and increasing to 2 grams daily.   I would not consider this a contaminate.   Awaiting urine culture Can switch to cefazolin Recommend a TTE, repeat blood cultures tomorrow  Dr. Rivka Saferavishankar will be available tomorrow for further recs.   Melissa Barefootobert W Lorriane Dehart, MD

## 2018-06-23 NOTE — Progress Notes (Signed)
PHARMACY - PHYSICIAN COMMUNICATION CRITICAL VALUE ALERT - BLOOD CULTURE IDENTIFICATION (BCID)  Melissa Coffey is an 22 y.o. female who [facility] to [facility]22[facility] with a chief complaint of flank pain/tachycardia  Assessment:  BICD detects MSSA 1/4 (include suspected source if known)  Name of physician (or Provider) Contacted: Kalisetti  Current antibiotics: ceftriaxone 1g daily  Changes to prescribed antibiotics recommended:  I spoke with Dr. Nemiah CommanderKalisetti as I am not convinced this is a contaminate as previously stated in prior Rx note. Originally asked to increase to 2g daily for bacteremia coverage, but stated cefazolin would be an even better option. MD concerned about what is growing in urine. I believe the urine is mostly likely the source of the bacteremia but MD would like to leave ceftriaxone for now but at higher dose. This will cover, just not the ideal choice. Will leave for now until ID pharmacist and physician can review tomorrow.  Results for orders placed or performed during the hospital encounter of 06/22/18  Blood Culture ID Panel (Reflexed) (Collected: 06/22/2018  4:15 PM)  Result Value Ref Range   Enterococcus species NOT DETECTED NOT DETECTED   Listeria monocytogenes NOT DETECTED NOT DETECTED   Staphylococcus species DETECTED (A) NOT DETECTED   Staphylococcus aureus DETECTED (A) NOT DETECTED   Methicillin resistance NOT DETECTED NOT DETECTED   Streptococcus species NOT DETECTED NOT DETECTED   Streptococcus agalactiae NOT DETECTED NOT DETECTED   Streptococcus pneumoniae NOT DETECTED NOT DETECTED   Streptococcus pyogenes NOT DETECTED NOT DETECTED   Acinetobacter baumannii NOT DETECTED NOT DETECTED   Enterobacteriaceae species NOT DETECTED NOT DETECTED   Enterobacter cloacae complex NOT DETECTED NOT DETECTED   Escherichia coli NOT DETECTED NOT DETECTED   Klebsiella oxytoca NOT DETECTED NOT DETECTED   Klebsiella pneumoniae NOT DETECTED NOT DETECTED   Proteus  species NOT DETECTED NOT DETECTED   Serratia marcescens NOT DETECTED NOT DETECTED   Haemophilus influenzae NOT DETECTED NOT DETECTED   Neisseria meningitidis NOT DETECTED NOT DETECTED   Pseudomonas aeruginosa NOT DETECTED NOT DETECTED   Candida albicans NOT DETECTED NOT DETECTED   Candida glabrata NOT DETECTED NOT DETECTED   Candida krusei NOT DETECTED NOT DETECTED   Candida parapsilosis NOT DETECTED NOT DETECTED   Candida tropicalis NOT DETECTED NOT DETECTED    Melissa Coffey 06/23/2018  12:33 PM

## 2018-06-23 NOTE — Progress Notes (Signed)
[employer] [facility] [place] at [facility]   PATIENT NAME: Melissa Coffey    MR#:  [id number]  DATE OF BIRTH:  [birth year]  SUBJECTIVE:  CHIEF COMPLAINT:   Chief Complaint  Patient presents with  . Flank Pain   -Admitted with sepsis secondary to pyelonephritis.  Still complains of significant left flank pain.  Also has headache and neck pain. -Fevers this morning  REVIEW OF SYSTEMS:  Review of Systems  Constitutional: Positive for chills and fever. Negative for malaise/fatigue.  HENT: Negative for congestion, ear discharge, hearing loss and nosebleeds.   Eyes: Negative for blurred vision and double vision.  Respiratory: Negative for cough, shortness of breath and wheezing.   Cardiovascular: Positive for palpitations. Negative for chest pain and leg swelling.  Gastrointestinal: Positive for abdominal pain. Negative for constipation, diarrhea, nausea and vomiting.  Genitourinary: Positive for flank pain. Negative for dysuria.  Musculoskeletal: Positive for back pain. Negative for myalgias.  Neurological: Positive for headaches. Negative for dizziness, focal weakness, seizures and weakness.  Psychiatric/Behavioral: Negative for depression.    DRUG ALLERGIES:  No Known Allergies  VITALS:  Blood pressure 115/68, pulse (!) 101, temperature 98.6 F (37 C), temperature source Oral, resp. rate 16, height 5\' 4"  (1.626 m), weight 59.9 kg, last menstrual period 06/08/2018, SpO2 98 %, unknown if currently breastfeeding.  PHYSICAL EXAMINATION:  Physical Exam  GENERAL:  22 y.o.-year-old patient lying in the bed with no acute distress.  EYES: Pupils equal, round, reactive to light and accommodation. No scleral icterus. Extraocular muscles intact.  HEENT: Head atraumatic, normocephalic. Oropharynx and nasopharynx clear.  NECK:  Supple, no jugular venous distention. No thyroid enlargement, no tenderness.  LUNGS: Normal breath sounds bilaterally, no wheezing, rales,rhonchi or  crepitation. No use of accessory muscles of respiration.  CARDIOVASCULAR: S1, S2 normal. No murmurs, rubs, or gallops.  ABDOMEN: Soft, nontender, nondistended. Bowel sounds present. No organomegaly or mass.  Positive for bilateral CVA tenderness worse on the left side. EXTREMITIES: No pedal edema, cyanosis, or clubbing.  NEUROLOGIC: Cranial nerves II through XII are intact. Muscle strength 5/5 in all extremities. Sensation intact. Gait not checked.  PSYCHIATRIC: The patient is alert and oriented x 3.  SKIN: No obvious rash, lesion, or ulcer.    LABORATORY PANEL:   CBC Recent Labs  Lab 06/23/18 0331  WBC 15.0*  HGB 11.0*  HCT 31.7*  PLT 242   ------------------------------------------------------------------------------------------------------------------  Chemistries  Recent Labs  Lab 06/22/18 1500 06/23/18 0331  NA 134* 137  K 3.5 3.5  CL 102 109  CO2 24 22  GLUCOSE 120* 134*  BUN 7 7  CREATININE 0.55 0.35*  CALCIUM 9.1 7.8*  MG  --  1.7  AST 16  --   ALT 16  --   ALKPHOS 76  --   BILITOT 1.6*  --    ------------------------------------------------------------------------------------------------------------------  Cardiac Enzymes No results for input(s): TROPONINI in the last 168 hours. ------------------------------------------------------------------------------------------------------------------  RADIOLOGY:  Ct Abdomen Pelvis W Contrast  Result Date: 06/22/2018 CLINICAL DATA:  History of UTI. Fever, chills, left abdominal pain. Abnormal CT stone study. EXAM: CT ABDOMEN AND PELVIS WITH CONTRAST TECHNIQUE: Multidetector CT imaging of the abdomen and pelvis was performed using the standard protocol following bolus administration of intravenous contrast. CONTRAST:  100mL ISOVUE-300 IOPAMIDOL (ISOVUE-300) INJECTION 61% COMPARISON:  CT urogram 06/22/2018 FINDINGS: Lower chest: Lung bases are clear. No effusions. Heart is normal size. Hepatobiliary: No focal hepatic  abnormality. Gallbladder unremarkable. Pancreas: No focal abnormality or ductal dilatation. Spleen:  No focal abnormality.  Normal size. Adrenals/Urinary Tract: No renal or ureteral stones. No hydronephrosis. Adrenal glands and urinary bladder unremarkable. Stomach/Bowel: There are likely 2 small bowel intussusceptions. The largest was noted on prior CT but now is to the right of midline rather than to the left of midline. A smaller filling defect is noted in a mid abdominal small bowel loop on image 42, likely also and intussusception. No evidence of bowel obstruction. Appendix not as well visualized as was on the prior study. Vascular/Lymphatic: No evidence of aneurysm or adenopathy. Reproductive: Uterus and adnexa unremarkable.  No mass. Other: Small amount of free fluid in the pelvis.  No free air. Musculoskeletal: No acute bony abnormality. IMPRESSION: There appear to be 2 small bowel intussusceptions, 1 large and 1 small within mid abdominal small bowel loops. No evidence of bowel obstruction. Within the larger intussusception, the internal bowel loop appears thick walled. No evidence of hydronephrosis or stones. Small amount of free fluid in the pelvis. Electronically Signed   By: Charlett Nose M.D.   On: 06/22/2018 18:49   Dg Chest Port 1 View  Result Date: 06/22/2018 CLINICAL DATA:  Sepsis, tachycardia EXAM: PORTABLE CHEST 1 VIEW COMPARISON:  None. FINDINGS: Heart and mediastinal contours are within normal limits. No focal opacities or effusions. No acute bony abnormality. IMPRESSION: No active disease. Electronically Signed   By: Charlett Nose M.D.   On: 06/22/2018 20:46   Ct Renal Stone Study  Result Date: 06/22/2018 CLINICAL DATA:  22 year old with left lower abdominal and flank pain for 3 days. EXAM: CT ABDOMEN AND PELVIS WITHOUT CONTRAST TECHNIQUE: Multidetector CT imaging of the abdomen and pelvis was performed following the standard protocol without IV contrast. COMPARISON:  None. FINDINGS:  Lower chest: Lung bases are clear. Hepatobiliary: Normal appearance of the liver and gallbladder. Pancreas: Unremarkable. No pancreatic ductal dilatation or surrounding inflammatory changes. Spleen: Spleen is mildly prominent for size but no gross abnormality. Adrenals/Urinary Tract: Adrenal glands are poorly characterized on this noncontrast examination. Normal appearance of the right kidney without hydronephrosis or stones. Difficult to exclude mild urinary bladder wall thickening. Inflammatory changes around the left kidney without significant hydronephrosis. No evidence for kidney stones. No evidence for a left ureter stone. Stomach/Bowel: There is high-density material within the appendix, possibly representing an appendicolith. No evidence for acute appendix inflammation. Unusual appearance of small bowel loops in the left abdomen. This is best seen on sequence 2, image 45. Findings raise concern for marked wall thickening in this area and cannot exclude a small bowel intussusception in this area. No gross abnormality to the stomach or duodenum. No evidence for a bowel obstruction. Vascular/Lymphatic: Limited evaluation of the vascular structures on this noncontrast examination. There are small lymph nodes in the inguinal regions. Stranding and mildly prominent left periaortic lymph nodes which could be reactive from the changes in left kidney. Reproductive: Uterus and bilateral adnexa are unremarkable. Other: Small amount of free fluid in the pelvis. Negative for free air. Musculoskeletal: No acute abnormality. IMPRESSION: Inflammatory changes involving the left kidney without stones or significant hydronephrosis. Findings are suggestive for left pyelonephritis. Cannot exclude mild bladder wall thickening. Abnormal appearance of small bowel loops in the left abdomen. Cannot exclude significant bowel wall thickening and/or intussusception in this area. Recommend a follow-up CT of the abdomen and pelvis with IV  and oral contrast to better characterize the small bowel and the left kidney. Small amount of free fluid in the pelvis which could be related to inflammatory  changes in the abdomen or physiologic. These results were called by telephone at the time of interpretation on 06/22/2018 at 4:21 pm to Dr. Jene Every , who verbally acknowledged these results. Electronically Signed   By: Richarda Overlie M.D.   On: 06/22/2018 16:26    EKG:   Orders placed or performed during the hospital encounter of 06/22/18  . EKG 12-Lead  . EKG 12-Lead  . EKG 12-Lead  . EKG 12-Lead    ASSESSMENT AND PLAN:   22 year old female with past medical history significant for chronic anemia, anxiety and depression presents to hospital secondary to worsening flank pain and fevers.  1.  Sepsis-secondary to urinary tract infection. -Follow-up urine cultures.  Blood cultures positive 1 out of 4 bottles with gram-positive cocci. -Continue Rocephin, 2 g now -CT of the abdomen showing left-sided pyelonephritis and thickened small bowel. -IV fluids and antibiotics and monitor  2.  SVT-labeled as SVT with increased heart rate yesterday.  Appreciate cardiology consult.  Likely sinus tachycardia in the setting of infection and pain -Echocardiogram has been done.  Monitor at this time and no further cardiac testing needed.  3.  Anemia of chronic disease-continue iron supplements.  No indication for transfusion  4.  Depression anxiety-on Zoloft daily  5.  DVT prophylaxis-Lovenox   All the records are reviewed and case discussed with Care Management/Social Workerr. Management plans discussed with the patient, family and they are in agreement.  CODE STATUS: Full code  TOTAL TIME TAKING CARE OF THIS PATIENT: 38 minutes.   POSSIBLE D/C IN 1-2 DAYS, DEPENDING ON CLINICAL CONDITION.   Bernie Ransford M.D on 06/23/2018 at 12:31 PM  Between 7am to 6pm - Pager - [phone]  After 6pm go to [email or web address] - password [email or web address]  [facility]  Office  [phone]  CC: Primary care physician; Patient, No Pcp Per

## 2018-06-23 NOTE — Consult Note (Addendum)
Cardiology Consultation:   Patient ID: KAMIYA ACORD; [id number]; Jan 18, 1996   Admit date: 06/22/2018 Date of Consult: 06/23/2018  Primary Care Provider: Patient, No Pcp Per Primary Cardiologist: New to [facility] Physician requesting consult: Dr. Imogene Burn Reason for consult: SVT   Patient Profile:   ALEAYA LATONA is a 22 y.o. female with no prior cardiac history She has had history of anxiety, depression, and anemia, endometriosis Presenting with flank pain back pain chills, fever, weakness nausea over the past 3-4 days. Cardiology consult if her tachycardia  History of Present Illness:   Ms. Looney reports worsening symptoms as above over the past several days Thought it would go away on its own but symptoms only got worse In the emergency room found to be tachycardic, leukocytosis, urinary tract infection  CT scan confirming left side polynephritis diffuse thickened small bowel Started on Antibiotics Slept much of the overnightstill with significant pain this morning  Arrived to the floor yesterday early evening with tachycardia heart rate in the low 100s,  Reports that she had worsening pain last night Was on telemetry, heart rate showing sinus tachycardia even up to heart rates in the 150s with slow improvement after 9:00 Overnight heart rates in the low 100s This morning high 80s to 90  Review of telemetry shows no SVT only sinus tachycardia EKG reviewed showing sinus tachycardia   Past Medical History:  Diagnosis Date  . Anemia   . Depression   . Endometriosis   . UTI (urinary tract infection)     Past Surgical History:  Procedure Laterality Date  . ABDOMINAL SURGERY    . CESAREAN SECTION  02/2014   pLTCS. FITL at term  . CESAREAN SECTION N/A 05/09/2016   Procedure: CESAREAN SECTION;  Surgeon: Conard Novak, MD;  Location: [facility] ORS;  Service: Obstetrics;  Laterality: N/A;  . NECK SURGERY  2003  . TONSILLECTOMY       Home Medications:  Prior to Admission  medications   Medication Sig Start Date End Date Taking? Authorizing Provider  cyclobenzaprine (FLEXERIL) 10 MG tablet Take 10 mg by mouth 3 (three) times daily as needed for muscle spasms.   Yes [provider]  ferrous sulfate 325 (65 FE) MG tablet Take 325 mg by mouth 2 (two) times daily with a meal.     [provider]  hydrOXYzine (ATARAX/VISTARIL) 50 MG tablet Take 1 tablet (50 mg total) by mouth 3 (three) times daily as needed for anxiety. Patient not taking: Reported on 06/22/2018 11/30/17   Galen Manila, NP  naproxen (NAPROSYN) 500 MG tablet Take 1 tablet (500 mg total) by mouth 2 (two) times daily with a meal. Patient not taking: Reported on 06/22/2018 02/07/18   Kem Boroughs B, FNP  sertraline (ZOLOFT) 50 MG tablet Take 1 tablet (50 mg total) by mouth daily. Patient not taking: Reported on 06/22/2018 11/30/17   Galen Manila, NP  sulfamethoxazole-trimethoprim (BACTRIM DS,SEPTRA DS) 800-160 MG tablet Take 1 tablet by mouth 2 (two) times daily. Patient not taking: Reported on 06/22/2018 05/13/18   Menshew, Charlesetta Ivory, PA-C    Inpatient Medications: Scheduled Meds: . enoxaparin (LOVENOX) injection  40 mg Subcutaneous Q24H  . ferrous sulfate  325 mg Oral BID WC  . sertraline  50 mg Oral Daily   Continuous Infusions: . sodium chloride 100 mL/hr at 06/23/18 1102  . cefTRIAXone (ROCEPHIN)  IV 2 g (06/23/18 1507)   PRN Meds: acetaminophen **OR** acetaminophen, albuterol, bisacodyl, HYDROcodone-acetaminophen, ketorolac, ondansetron **OR** ondansetron (  ZOFRAN) IV, senna-docusate  Allergies:   No Known Allergies  Social History:   Social History   Socioeconomic History  . Marital status: Single    Spouse name: Not on file  . Number of children: 2  . Years of education: Not on file  . Highest education level: High school graduate  Occupational History  . Not on file  Social Needs  . Financial resource strain: Not on file  . Food insecurity:     Worry: Not on file    Inability: Not on file  . Transportation needs:    Medical: Not on file    Non-medical: Not on file  Tobacco Use  . Smoking status: Current Every Day Smoker    Packs/day: 0.50    Years: 0.50    Pack years: 0.25    Types: Cigarettes    Last attempt to quit: 01/07/2017    Years since quitting: 1.4  . Smokeless tobacco: Never Used  Substance and Sexual Activity  . Alcohol use: No  . Drug use: No  . Sexual activity: Yes  Lifestyle  . Physical activity:    Days per week: Not on file    Minutes per session: Not on file  . Stress: Not on file  Relationships  . Social connections:    Talks on phone: Not on file    Gets together: Not on file    Attends religious service: Not on file    Active member of club or organization: Not on file    Attends meetings of clubs or organizations: Not on file    Relationship status: Not on file  . Intimate partner violence:    Fear of current or ex partner: Not on file    Emotionally abused: Not on file    Physically abused: Not on file    Forced sexual activity: Not on file  Other Topics Concern  . Not on file  Social History Narrative  . Not on file    Family History:    Family History  Problem Relation Age of Onset  . Diabetes Maternal Grandmother   . Hypertension Maternal Grandmother   . Cancer Maternal Grandmother   . Breast cancer Maternal Grandmother   . Diabetes Maternal Uncle   . Healthy Mother   . Healthy Brother   . Healthy Daughter   . Healthy Son   . Healthy Brother   . Stroke Neg Hx   . Heart attack Neg Hx   . Colon cancer Neg Hx   . Ovarian cancer Neg Hx      ROS:  Please see the history of present illness.  Review of Systems  Constitutional: Negative.   Respiratory: Negative.   Cardiovascular: Negative.   Gastrointestinal: Negative.   Musculoskeletal: Positive for neck pain.       Flank pain  Neurological: Negative.   Psychiatric/Behavioral: Negative.   All other systems reviewed and  are negative.   Physical Exam/Data:   Vitals:   06/22/18 2247 06/22/18 2356 06/23/18 0227 06/23/18 0830  BP:  106/66 111/70 115/68  Pulse:  (!) 114 (!) 111 (!) 101  Resp:  17  16  Temp: 99 F (37.2 C) 98.3 F (36.8 C)  98.6 F (37 C)  TempSrc: Oral Oral  Oral  SpO2:  100%  98%  Weight:      Height:       No intake or output data in the 24 hours ending 06/23/18 1624 Filed Weights   06/22/18 1457  Weight: 59.9 kg   Body mass index is 22.66 kg/m.  General:  Well nourished, well developed, moderate discomfort from back pain HEENT: normal Lymph: no adenopathy Neck: no JVD Endocrine:  No thryomegaly Vascular: No carotid bruits; FA pulses 2+ bilaterally without bruits  Cardiac:  normal S1, S2; RRR; no murmur  Lungs:  clear to auscultation bilaterally, no wheezing, rhonchi or rales  Abd: soft, nontender, no hepatomegaly  Ext: no edema Musculoskeletal:  No deformities, BUE and BLE strength normal and equal Skin: warm and dry  Neuro:  Grossly intact Psych:  Normal affect   EKG:  The EKG was personally reviewed and demonstrates:   Sinus tachycardia with no significant ST or T-wave changes  Telemetry:  Telemetry was personally reviewed and demonstrates:   normal sinus rhythm  Relevant CV Studies: Echocardiogram Normal LV function No significant valve disease  Laboratory Data:  Chemistry Recent Labs  Lab 06/22/18 1500 06/23/18 0331  NA 134* 137  K 3.5 3.5  CL 102 109  CO2 24 22  GLUCOSE 120* 134*  BUN 7 7  CREATININE 0.55 0.35*  CALCIUM 9.1 7.8*  GFRNONAA >60 >60  GFRAA >60 >60  ANIONGAP 8 6    Recent Labs  Lab 06/22/18 1500  PROT 7.8  ALBUMIN 4.0  AST 16  ALT 16  ALKPHOS 76  BILITOT 1.6*   Hematology Recent Labs  Lab 06/22/18 1500 06/23/18 0331  WBC 16.2* 15.0*  RBC 4.78 3.86  HGB 13.5 11.0*  HCT 39.8 31.7*  MCV 83.2 82.0  MCH 28.2 28.4  MCHC 33.9 34.7  RDW 15.6* 15.4*  PLT 303 242   Cardiac EnzymesNo results for input(s): TROPONINI  in the last 168 hours. No results for input(s): TROPIPOC in the last 168 hours.  BNPNo results for input(s): BNP, PROBNP in the last 168 hours.  DDimer No results for input(s): DDIMER in the last 168 hours.  Radiology/Studies:  Ct Abdomen Pelvis W Contrast  Result Date: 06/22/2018 CLINICAL DATA:  History of UTI. Fever, chills, left abdominal pain. Abnormal CT stone study. EXAM: CT ABDOMEN AND PELVIS WITH CONTRAST TECHNIQUE: Multidetector CT imaging of the abdomen and pelvis was performed using the standard protocol following bolus administration of intravenous contrast. CONTRAST:  100mL ISOVUE-300 IOPAMIDOL (ISOVUE-300) INJECTION 61% COMPARISON:  CT urogram 06/22/2018 FINDINGS: Lower chest: Lung bases are clear. No effusions. Heart is normal size. Hepatobiliary: No focal hepatic abnormality. Gallbladder unremarkable. Pancreas: No focal abnormality or ductal dilatation. Spleen: No focal abnormality.  Normal size. Adrenals/Urinary Tract: No renal or ureteral stones. No hydronephrosis. Adrenal glands and urinary bladder unremarkable. Stomach/Bowel: There are likely 2 small bowel intussusceptions. The largest was noted on prior CT but now is to the right of midline rather than to the left of midline. A smaller filling defect is noted in a mid abdominal small bowel loop on image 42, likely also and intussusception. No evidence of bowel obstruction. Appendix not as well visualized as was on the prior study. Vascular/Lymphatic: No evidence of aneurysm or adenopathy. Reproductive: Uterus and adnexa unremarkable.  No mass. Other: Small amount of free fluid in the pelvis.  No free air. Musculoskeletal: No acute bony abnormality. IMPRESSION: There appear to be 2 small bowel intussusceptions, 1 large and 1 small within mid abdominal small bowel loops. No evidence of bowel obstruction. Within the larger intussusception, the internal bowel loop appears thick walled. No evidence of hydronephrosis or stones. Small amount of  free fluid in the pelvis. Electronically Signed   By: Caryn BeeKevin  Dover M.D.   On: 06/22/2018 18:49   Dg Chest Port 1 View  Result Date: 06/22/2018 CLINICAL DATA:  Sepsis, tachycardia EXAM: PORTABLE CHEST 1 VIEW COMPARISON:  None. FINDINGS: Heart and mediastinal contours are within normal limits. No focal opacities or effusions. No acute bony abnormality. IMPRESSION: No active disease. Electronically Signed   By: Charlett Nose M.D.   On: 06/22/2018 20:46   Ct Renal Stone Study  Result Date: 06/22/2018 CLINICAL DATA:  22 year old with left lower abdominal and flank pain for 3 days. EXAM: CT ABDOMEN AND PELVIS WITHOUT CONTRAST TECHNIQUE: Multidetector CT imaging of the abdomen and pelvis was performed following the standard protocol without IV contrast. COMPARISON:  None. FINDINGS: Lower chest: Lung bases are clear. Hepatobiliary: Normal appearance of the liver and gallbladder. Pancreas: Unremarkable. No pancreatic ductal dilatation or surrounding inflammatory changes. Spleen: Spleen is mildly prominent for size but no gross abnormality. Adrenals/Urinary Tract: Adrenal glands are poorly characterized on this noncontrast examination. Normal appearance of the right kidney without hydronephrosis or stones. Difficult to exclude mild urinary bladder wall thickening. Inflammatory changes around the left kidney without significant hydronephrosis. No evidence for kidney stones. No evidence for a left ureter stone. Stomach/Bowel: There is high-density material within the appendix, possibly representing an appendicolith. No evidence for acute appendix inflammation. Unusual appearance of small bowel loops in the left abdomen. This is best seen on sequence 2, image 45. Findings raise concern for marked wall thickening in this area and cannot exclude a small bowel intussusception in this area. No gross abnormality to the stomach or duodenum. No evidence for a bowel obstruction. Vascular/Lymphatic: Limited evaluation of the  vascular structures on this noncontrast examination. There are small lymph nodes in the inguinal regions. Stranding and mildly prominent left periaortic lymph nodes which could be reactive from the changes in left kidney. Reproductive: Uterus and bilateral adnexa are unremarkable. Other: Small amount of free fluid in the pelvis. Negative for free air. Musculoskeletal: No acute abnormality. IMPRESSION: Inflammatory changes involving the left kidney without stones or significant hydronephrosis. Findings are suggestive for left pyelonephritis. Cannot exclude mild bladder wall thickening. Abnormal appearance of small bowel loops in the left abdomen. Cannot exclude significant bowel wall thickening and/or intussusception in this area. Recommend a follow-up CT of the abdomen and pelvis with IV and oral contrast to better characterize the small bowel and the left kidney. Small amount of free fluid in the pelvis which could be related to inflammatory changes in the abdomen or physiologic. These results were called by telephone at the time of interpretation on 06/22/2018 at 4:21 pm to Dr. Jene Every , who verbally acknowledged these results. Electronically Signed   By: Richarda Overlie M.D.   On: 06/22/2018 16:26    Assessment and Plan:   1. Sinus tachycardia EKG labeled rhythm as SVT but in fact she had sinus tachycardia last night Likely elevated heart rate from pain and infection Improved this morning on IV hydration and advice with better pain control Echocardiogram essentially normal No further cardiac workup needed  2) pyelonephritis Positive UA, back pain On broad-spectrum antibiotics with pain control and IV hydration UA growing Escherichia coli Blood cultures no growth on one bottle, aerobic bottle Second bottle gram-positive cocci, MSSA, in anaerobic bottle only -would repeat cultures  3) sepsis Management as above  Case discussed with hospitalist service  Total encounter time more than 110  minutes  Greater than 50% was spent in counseling and coordination of care with the patient  For  questions or updates, please contact [facility] Please consult [email or web address] for contact info under Cardiology/STEMI.   Signed, Julien Nordmann, MD  06/23/2018 4:24 PM

## 2018-06-24 DIAGNOSIS — L739 Follicular disorder, unspecified: Secondary | ICD-10-CM

## 2018-06-24 DIAGNOSIS — F17210 Nicotine dependence, cigarettes, uncomplicated: Secondary | ICD-10-CM

## 2018-06-24 DIAGNOSIS — F329 Major depressive disorder, single episode, unspecified: Secondary | ICD-10-CM

## 2018-06-24 DIAGNOSIS — N809 Endometriosis, unspecified: Secondary | ICD-10-CM

## 2018-06-24 DIAGNOSIS — B9561 Methicillin susceptible Staphylococcus aureus infection as the cause of diseases classified elsewhere: Secondary | ICD-10-CM

## 2018-06-24 DIAGNOSIS — Z872 Personal history of diseases of the skin and subcutaneous tissue: Secondary | ICD-10-CM

## 2018-06-24 DIAGNOSIS — B9620 Unspecified Escherichia coli [E. coli] as the cause of diseases classified elsewhere: Secondary | ICD-10-CM

## 2018-06-24 DIAGNOSIS — N12 Tubulo-interstitial nephritis, not specified as acute or chronic: Secondary | ICD-10-CM

## 2018-06-24 DIAGNOSIS — Z87440 Personal history of urinary (tract) infections: Secondary | ICD-10-CM

## 2018-06-24 DIAGNOSIS — F424 Excoriation (skin-picking) disorder: Secondary | ICD-10-CM

## 2018-06-24 DIAGNOSIS — R7881 Bacteremia: Secondary | ICD-10-CM

## 2018-06-24 LAB — URINE CULTURE: Culture: >=100000 — AB

## 2018-06-24 LAB — URINE DRUG SCREEN, QUALITATIVE (ARMC ONLY)
Amphetamines, Ur Screen: POSITIVE — AB
BARBITURATES, UR SCREEN: NOT DETECTED
COCAINE METABOLITE, UR ~~LOC~~: NOT DETECTED
Cannabinoid 50 Ng, Ur ~~LOC~~: NOT DETECTED
MDMA (Ecstasy)Ur Screen: NOT DETECTED
Methadone Scn, Ur: NOT DETECTED
Opiate, Ur Screen: POSITIVE — AB
Phencyclidine (PCP) Ur S: NOT DETECTED
TRICYCLIC, UR SCREEN: NOT DETECTED

## 2018-06-24 LAB — CBC
HEMATOCRIT: 31.1 % — AB (ref 35.0–47.0)
Hemoglobin: 10.7 g/dL — ABNORMAL LOW (ref 12.0–16.0)
MCH: 28.3 pg (ref 26.0–34.0)
MCHC: 34.5 g/dL (ref 32.0–36.0)
MCV: 82.1 fL (ref 80.0–100.0)
PLATELETS: 222 10*3/uL (ref 150–440)
RBC: 3.79 MIL/uL — ABNORMAL LOW (ref 3.80–5.20)
RDW: 15.2 % — AB (ref 11.5–14.5)
WBC: 7.4 10*3/uL (ref 3.6–11.0)

## 2018-06-24 LAB — BASIC METABOLIC PANEL
ANION GAP: 6 (ref 5–15)
BUN: 7 mg/dL (ref 6–20)
CALCIUM: 7.7 mg/dL — AB (ref 8.9–10.3)
CO2: 24 mmol/L (ref 22–32)
CREATININE: 0.39 mg/dL — AB (ref 0.44–1.00)
Chloride: 108 mmol/L (ref 98–111)
Glucose, Bld: 88 mg/dL (ref 70–99)
Potassium: 3.5 mmol/L (ref 3.5–5.1)
SODIUM: 138 mmol/L (ref 135–145)

## 2018-06-24 LAB — TSH: TSH: 0.977 u[IU]/mL (ref 0.350–4.500)

## 2018-06-24 LAB — MAGNESIUM: Magnesium: 1.9 mg/dL (ref 1.7–2.4)

## 2018-06-24 MED ORDER — SENNOSIDES-DOCUSATE SODIUM 8.6-50 MG PO TABS
1.0000 | ORAL_TABLET | Freq: Two times a day (BID) | ORAL | Status: DC
  Administered 2018-06-24 (×2): 1 via ORAL
  Filled 2018-06-24 (×2): qty 1

## 2018-06-24 MED ORDER — CEFAZOLIN SODIUM-DEXTROSE 2-4 GM/100ML-% IV SOLN
2.0000 g | Freq: Three times a day (TID) | INTRAVENOUS | Status: DC
  Administered 2018-06-24 – 2018-06-25 (×4): 2 g via INTRAVENOUS
  Filled 2018-06-24 (×5): qty 100

## 2018-06-24 MED ORDER — POLYETHYLENE GLYCOL 3350 17 G PO PACK: 17.0000 g | PACK | Freq: Every day | ORAL | Status: DC | PRN

## 2018-06-24 NOTE — Consult Note (Signed)
Pharmacy Antibiotic Note  Melissa RutherfordHarley R Coffey is a 22 y.o. female admitted on 06/22/2018 with flank pain and tachycardia. Patient was diagnosed with bacteremia and UTI.   Plan: Patient will be switched from ceftriaxone to cefazolin IV 2g q8h in order to decrease spectrum of coverage.  Height: 5\' 4"  (162.6 cm) Weight: 132 lb (59.9 kg) IBW/kg (Calculated) : 54.7  Temp (24hrs), Avg:98.9 F (37.2 C), Min:98.1 F (36.7 C), Max:99.9 F (37.7 C)  Recent Labs  Lab 06/22/18 1500 06/22/18 1615 06/23/18 0331 06/24/18 0342  WBC 16.2*  --  15.0* 7.4  CREATININE 0.55  --  0.35* 0.39*  LATICACIDVEN 0.9 1.0  --   --     Estimated Creatinine Clearance: 94.4 mL/min (A) (by C-G formula based on SCr of 0.39 mg/dL (L)).    No Known Allergies  Antimicrobials this admission: Ceftriaxone 8/24 >> 8/26 Cefazolin 8/26 >>   Microbiology results: 8/26 BCx: MSSA 8/26 UCx: E Coli - Sensitive to cefazolin   Thank you for allowing pharmacy to be a part of this patient's care.  Carlena HurlJames Cleotilde Spadaccini PharmD Student 06/24/2018 2:58 PM

## 2018-06-24 NOTE — Progress Notes (Signed)
[facility] [place] [facility]   PATIENT NAME: Melissa Coffey    MR#:  [id number]  DATE OF BIRTH:  10/04/1996  SUBJECTIVE:  CHIEF COMPLAINT:   Chief Complaint  Patient presents with  . Flank Pain   -Low-grade temperatures only.  Much improved back pain. -Still complains of headache and neck pain  REVIEW OF SYSTEMS:  Review of Systems  Constitutional: Negative for chills, fever and malaise/fatigue.  HENT: Negative for congestion, ear discharge, hearing loss and nosebleeds.   Eyes: Negative for blurred vision and double vision.  Respiratory: Negative for cough, shortness of breath and wheezing.   Cardiovascular: Positive for palpitations. Negative for chest pain and leg swelling.  Gastrointestinal: Positive for abdominal pain. Negative for constipation, diarrhea, nausea and vomiting.  Genitourinary: Negative for dysuria and flank pain.  Musculoskeletal: Negative for back pain and myalgias.  Neurological: Positive for headaches. Negative for dizziness, focal weakness, seizures and weakness.  Psychiatric/Behavioral: Negative for depression.    DRUG ALLERGIES:  No Known Allergies  VITALS:  Blood pressure 110/84, pulse 81, temperature 98.1 F (36.7 C), temperature source Oral, resp. rate 18, height 5\' 4"  (1.626 m), weight 59.9 kg, last menstrual period 06/08/2018, SpO2 99 %, unknown if currently breastfeeding.  PHYSICAL EXAMINATION:  Physical Exam  GENERAL:  22 y.o.-year-old patient lying in the bed with no acute distress.  EYES: Pupils equal, round, reactive to light and accommodation. No scleral icterus. Extraocular muscles intact.  HEENT: Head atraumatic, normocephalic. Oropharynx and nasopharynx clear.  NECK:  Supple, no jugular venous distention. No thyroid enlargement, no tenderness.  LUNGS: Normal breath sounds bilaterally, no wheezing, rales,rhonchi or crepitation. No use of accessory muscles of respiration.  CARDIOVASCULAR: S1, S2 normal. No  murmurs, rubs, or gallops.  ABDOMEN: Soft, nontender, nondistended. Bowel sounds present. No organomegaly or mass.  Left CVA tenderness is much improved. EXTREMITIES: No pedal edema, cyanosis, or clubbing.  NEUROLOGIC: Cranial nerves II through XII are intact. Muscle strength 5/5 in all extremities. Sensation intact. Gait not checked.  PSYCHIATRIC: The patient is alert and oriented x 3.  SKIN: No obvious rash, lesion, or ulcer.    LABORATORY PANEL:   CBC Recent Labs  Lab 06/24/18 0342  WBC 7.4  HGB 10.7*  HCT 31.1*  PLT 222   ------------------------------------------------------------------------------------------------------------------  Chemistries  Recent Labs  Lab 06/22/18 1500  06/24/18 0342  NA 134*   < > 138  K 3.5   < > 3.5  CL 102   < > 108  CO2 24   < > 24  GLUCOSE 120*   < > 88  BUN 7   < > 7  CREATININE 0.55   < > 0.39*  CALCIUM 9.1   < > 7.7*  MG  --    < > 1.9  AST 16  --   --   ALT 16  --   --   ALKPHOS 76  --   --   BILITOT 1.6*  --   --    < > = values in this interval not displayed.   ------------------------------------------------------------------------------------------------------------------  Cardiac Enzymes No results for input(s): TROPONINI in the last 168 hours. ------------------------------------------------------------------------------------------------------------------  RADIOLOGY:  Ct Abdomen Pelvis W Contrast  Result Date: 06/22/2018 CLINICAL DATA:  History of UTI. Fever, chills, left abdominal pain. Abnormal CT stone study. EXAM: CT ABDOMEN AND PELVIS WITH CONTRAST TECHNIQUE: Multidetector CT imaging of the abdomen and pelvis was performed using the standard protocol following bolus administration of intravenous contrast.  CONTRAST:  ISOVUE-300 IOPAMIDOL (ISOVUE-300) INJECTION 61% COMPARISON:  CT urogram 06/22/2018 FINDINGS: Lower chest: Lung bases are clear. No effusions. Heart is normal size. Hepatobiliary: No focal hepatic  abnormality. Gallbladder unremarkable. Pancreas: No focal abnormality or ductal dilatation. Spleen: No focal abnormality.  Normal size. Adrenals/Urinary Tract: No renal or ureteral stones. No hydronephrosis. Adrenal glands and urinary bladder unremarkable. Stomach/Bowel: There are likely 2 small bowel intussusceptions. The largest was noted on prior CT but now is to the right of midline rather than to the left of midline. A smaller filling defect is noted in a mid abdominal small bowel loop on image 42, likely also and intussusception. No evidence of bowel obstruction. Appendix not as well visualized as was on the prior study. Vascular/Lymphatic: No evidence of aneurysm or adenopathy. Reproductive: Uterus and adnexa unremarkable.  No mass. Other: Small amount of free fluid in the pelvis.  No free air. Musculoskeletal: No acute bony abnormality. IMPRESSION: There appear to be 2 small bowel intussusceptions, 1 large and 1 small within mid abdominal small bowel loops. No evidence of bowel obstruction. Within the larger intussusception, the internal bowel loop appears thick walled. No evidence of hydronephrosis or stones. Small amount of free fluid in the pelvis. Electronically Signed   By: Charlett Nose M.D.   On: 06/22/2018 18:49   Dg Chest Port 1 View  Result Date: 06/22/2018 CLINICAL DATA:  Sepsis, tachycardia EXAM: PORTABLE CHEST 1 VIEW COMPARISON:  None. FINDINGS: Heart and mediastinal contours are within normal limits. No focal opacities or effusions. No acute bony abnormality. IMPRESSION: No active disease. Electronically Signed   By: Charlett Nose M.D.   On: 06/22/2018 20:46   Ct Renal Stone Study  Result Date: 06/22/2018 CLINICAL DATA:  22 year old with left lower abdominal and flank pain for 3 days. EXAM: CT ABDOMEN AND PELVIS WITHOUT CONTRAST TECHNIQUE: Multidetector CT imaging of the abdomen and pelvis was performed following the standard protocol without IV contrast. COMPARISON:  None. FINDINGS:  Lower chest: Lung bases are clear. Hepatobiliary: Normal appearance of the liver and gallbladder. Pancreas: Unremarkable. No pancreatic ductal dilatation or surrounding inflammatory changes. Spleen: Spleen is mildly prominent for size but no gross abnormality. Adrenals/Urinary Tract: Adrenal glands are poorly characterized on this noncontrast examination. Normal appearance of the right kidney without hydronephrosis or stones. Difficult to exclude mild urinary bladder wall thickening. Inflammatory changes around the left kidney without significant hydronephrosis. No evidence for kidney stones. No evidence for a left ureter stone. Stomach/Bowel: There is high-density material within the appendix, possibly representing an appendicolith. No evidence for acute appendix inflammation. Unusual appearance of small bowel loops in the left abdomen. This is best seen on sequence 2, image 45. Findings raise concern for marked wall thickening in this area and cannot exclude a small bowel intussusception in this area. No gross abnormality to the stomach or duodenum. No evidence for a bowel obstruction. Vascular/Lymphatic: Limited evaluation of the vascular structures on this noncontrast examination. There are small lymph nodes in the inguinal regions. Stranding and mildly prominent left periaortic lymph nodes which could be reactive from the changes in left kidney. Reproductive: Uterus and bilateral adnexa are unremarkable. Other: Small amount of free fluid in the pelvis. Negative for free air. Musculoskeletal: No acute abnormality. IMPRESSION: Inflammatory changes involving the left kidney without stones or significant hydronephrosis. Findings are suggestive for left pyelonephritis. Cannot exclude mild bladder wall thickening. Abnormal appearance of small bowel loops in the left abdomen. Cannot exclude significant bowel wall thickening and/or intussusception  in this area. Recommend a follow-up CT of the abdomen and pelvis with IV  and oral contrast to better characterize the small bowel and the left kidney. Small amount of free fluid in the pelvis which could be related to inflammatory changes in the abdomen or physiologic. These results were called by telephone at the time of interpretation on 06/22/2018 at 4:21 pm to Dr. Jene EveryOBERT KINNER , who verbally acknowledged these results. Electronically Signed   By: Richarda OverlieAdam  Henn M.D.   On: 06/22/2018 16:26    EKG:   Orders placed or performed during the hospital encounter of 06/22/18  . EKG 12-Lead  . EKG 12-Lead  . EKG 12-Lead  . EKG 12-Lead    ASSESSMENT AND PLAN:   22 year old female with past medical history significant for chronic anemia, anxiety and depression presents to hospital secondary to worsening flank pain and fevers.  1.  Sepsis-secondary to urinary tract infection. -Urine cultures growing E. coli.  1 out of 4 blood cultures growing MSSA. -Appreciate ID input -on Rocephin,-changed to Ancef now.  Repeat blood cultures ordered for today.  If negative, will get a PICC line for IV antibiotics at home due to MSSA bacteremia.  Likely source superficial skin wounds -Urine tox is pending -CT of the abdomen showing left-sided pyelonephritis and thickened small bowel. -IV fluids and antibiotics and monitor  2.  SVT-labeled as SVT with increased heart rate on admission.  Appreciate cardiology consult.  Likely sinus tachycardia in the setting of infection and pain -Echocardiogram is normal.  Monitor at this time and no further cardiac testing needed.  3.  Anemia of chronic disease-continue iron supplements.  No indication for transfusion  4.  Depression anxiety-on Zoloft daily  5.  DVT prophylaxis-Lovenox  Anticipate discharge tomorrow if stable   All the records are reviewed and case discussed with Care Management/Social Workerr. Management plans discussed with the patient, family and they are in agreement.  CODE STATUS: Full code  TOTAL TIME TAKING CARE OF  THIS PATIENT: 36 minutes.   POSSIBLE D/C IN 1-2 DAYS, DEPENDING ON CLINICAL CONDITION.   Tanazia Achee M.D on 06/24/2018 at 1:48 PM  Between 7am to 6pm - Pager - [phone]  After 6pm go to [email or web address] - password [email or web address]  [facility] [facility]  Office  [phone]  CC: Primary care physician; Patient, No Pcp Per

## 2018-06-24 NOTE — Consult Note (Signed)
Date of Admission:  06/22/2018                 Reason for Consult: Staph aureus bacteremia and urosepsis   Referring Provider: Nemiah CommanderKalisetti    HPI: Melissa RutherfordHarley R Ocheltree is a 22 y.o. female with history of recurrent skin infections likely secondary to skin picking, history of UTI, endometriosis, depression presents with fever and 6-day history of left flank pain. Patient states she was in [place] with her boyfriend and over that she developed an acute pain in her left flank then it spread to the right side as well.  She also had fever along with it.  She put up with it for 3 days and as she was not getting any better once she came back [facility] she came to the ED on 06/22/2018.  She had a heart rate of 163 on arrival and a temperature of 102 while in the ED.  She had a CT of the abdomen and pelvis on the did not show any hydronephrosis or stones.  Blood cultures were sent and she was started on IV ceftriaxone. Her blood culture 1 out of the 2 bottle had staph aureus and ID is doing a consult for the same.  Cardiology saw her for the sinus tachycardia and ruled out SVT.  She has been started on Patient was in the ED on July 15 for an abscess on her face and was given Bactrim.  Prior to that she was in the ED on April 22, 2018 for a sore on her right knee which had some yellow and pink fluid  and was prescribed Bactrim for a few days.  No cultures were sent on both occasions.  Past Medical History:  Diagnosis Date  . Anemia   . Depression   . Endometriosis   . UTI (urinary tract infection)    Past surgical history C-section Right neck surgery after a dog bite Tonsillectomy and adenoidectomy   Social History   Tobacco Use  . Smoking status: Current Every Day Smoker    Packs/day: 0.50    Years: 0.50    Pack years: 0.25    Types: Cigarettes    Last attempt to quit: 01/07/2017    Years since quitting: 1.4  . Smokeless tobacco: Never Used  Substance Use Topics  . Alcohol use: No  .  Drug use: No    Family History  Problem Relation Age of Onset  . Diabetes Maternal Grandmother   . Hypertension Maternal Grandmother   . Cancer Maternal Grandmother   . Breast cancer Maternal Grandmother   . Diabetes Maternal Uncle   . Healthy Mother   . Healthy Brother   . Healthy Daughter   . Healthy Son   . Healthy Brother   . Stroke Neg Hx   . Heart attack Neg Hx   . Colon cancer Neg Hx   . Ovarian cancer Neg Hx      . enoxaparin (LOVENOX) injection  40 mg Subcutaneous Q24H  . ferrous sulfate  325 mg Oral BID WC  . sertraline  50 mg Oral Daily      Abtx:  Anti-infectives (From admission, onward)   Start     Dose/Rate Route Frequency Ordered Stop   06/24/18 1000  ceFAZolin (ANCEF) IVPB 2g/100 mL premix     2 g 200 mL/hr over 30 Minutes Intravenous Every 8 hours 06/24/18 0934     06/23/18 1800  cefTRIAXone (ROCEPHIN) 1 g in sodium chloride 0.9 % 100  mL IVPB  Status:  Discontinued     1 g 200 mL/hr over 30 Minutes Intravenous Every 24 hours 06/22/18 2020 06/23/18 1232   06/23/18 1300  cefTRIAXone (ROCEPHIN) 2 g in sodium chloride 0.9 % 100 mL IVPB  Status:  Discontinued     2 g 200 mL/hr over 30 Minutes Intravenous Every 24 hours 06/23/18 1232 06/24/18 0934   06/22/18 1545  cefTRIAXone (ROCEPHIN) 1 g in sodium chloride 0.9 % 100 mL IVPB     1 g 200 mL/hr over 30 Minutes Intravenous  Once 06/22/18 1530 06/22/18 1620       Review of Systems: Review of Systems  Constitutional: Positive for chills, diaphoresis, fever and malaise/fatigue. Negative for weight loss.  HENT: Negative for congestion, hearing loss and sore throat.   Eyes: Negative for blurred vision, discharge and redness.  Respiratory: Negative for cough, shortness of breath and wheezing.   Cardiovascular: Positive for palpitations. Negative for chest pain, leg swelling and PND.  Gastrointestinal: Positive for nausea. Negative for abdominal pain.  Genitourinary: Positive for dysuria and flank pain.  Negative for frequency, hematuria and urgency.  Musculoskeletal: Positive for back pain and myalgias.  Skin: Positive for itching and rash.  Neurological: Positive for weakness and headaches.  Psychiatric/Behavioral: Positive for depression. Negative for substance abuse. The patient is nervous/anxious.      OBJECTIVE: Blood pressure 113/80, pulse 91, temperature 98.7 F (37.1 C), temperature source Oral, resp. rate 18, height 5\' 4"  (1.626 m), weight 59.9 kg, last menstrual period 06/08/2018, SpO2 100 %, unknown if currently breastfeeding.  Physical Exam  Constitutional: She is oriented to person, place, and time. She appears well-nourished. No distress.  HENT:  Head: Normocephalic.  Eyes: Pupils are equal, round, and reactive to light.  Neck: Normal range of motion. Neck supple. No thyromegaly present.  Cardiovascular:  Tachycardia s1s2 No murmur  Pulmonary/Chest:  Bilateral air entry No rhonchi or crepitations heard.  Abdominal:  Soft, no tenderness, no renal angle tenderness C-section scar present.  Musculoskeletal: Normal range of motion. She exhibits no edema or tenderness.  Neurological: She is alert and oriented to person, place, and time.  Nonfocal  Skin: Skin is dry.  Multiple pinpoint excoriation-like lesions on her legs. On her right knee there is a chronic wound with scab on it. There is no erythema or tenderness.  Psychiatric: She has a normal mood and affect. Her behavior is normal. Thought content normal.  Multiple tattoos on her arms Poor dentition           Component Value Date/Time   WBC 7.4 06/24/2018 0342   RBC 3.79 (L) 06/24/2018 0342   HGB 10.7 (L) 06/24/2018 0342   HGB 9.2 (L) 03/15/2014 1127   HCT 31.1 (L) 06/24/2018 0342   HCT 25.5 (L) 03/16/2014 0456   PLT 222 06/24/2018 0342   PLT 290 03/15/2014 1127   MCV 82.1 06/24/2018 0342   MCV 85 03/15/2014 1127   MCH 28.3 06/24/2018 0342   MCHC 34.5 06/24/2018 0342   RDW 15.2 (H) 06/24/2018  0342   RDW 14.3 03/15/2014 1127   LYMPHSABS 1.4 04/03/2016 1320   LYMPHSABS 2.3 03/13/2014 2104   MONOABS 0.4 04/03/2016 1320   MONOABS 0.9 03/13/2014 2104   EOSABS 0.0 04/03/2016 1320   EOSABS 0.1 03/13/2014 2104   BASOSABS 0.2 (H) 04/03/2016 1320   BASOSABS 0.0 03/13/2014 2104    CMP Latest Ref Rng & Units 06/24/2018 06/23/2018 06/22/2018  Glucose 70 - 99 mg/dL 88 295(A) 213(Y)  BUN 6 - 20 mg/dL 7 7 7   Creatinine 0.44 - 1.00 mg/dL 1.61(W) 9.60(A) 5.40  Sodium 135 - 145 mmol/L 138 137 134(L)  Potassium 3.5 - 5.1 mmol/L 3.5 3.5 3.5  Chloride 98 - 111 mmol/L 108 109 102  CO2 22 - 32 mmol/L 24 22 24   Calcium 8.9 - 10.3 mg/dL 7.7(L) 7.8(L) 9.1  Total Protein 6.5 - 8.1 g/dL - - 7.8  Total Bilirubin 0.3 - 1.2 mg/dL - - 1.6(H)  Alkaline Phos 38 - 126 U/L - - 76  AST 15 - 41 U/L - - 16  ALT 0 - 44 U/L - - 16      Microbiology: Urine culture from 06/22/2018 as E. coli UA shows more than 50 WBCs Blood culture 2 sets of blood cultures were drawn on June 22, 2018 The anaerobic bottle of one set had Staphylococcus aureus bacteremia.          Radiographs and labs were personally reviewed by me.  Chest x-ray has no infiltrate There is CT abdomen was reviewed with the radiologist.  There is possible left pyelonephritis The lumbar spine does not show any evidence of discitis or osteomyelitis.  2D echo  all the valves were structurally normal with no evidence of endocarditis.   Assessment and Plan 22 y.o. female with history of recurrent skin infections likely secondary to skin picking, history of UTI, endometriosis, depression presents with fever and 6-day history of left flank pain. Patient states she was in [place] with her boyfriend and over that she developed an acute pain in her left flank then it spread to the right side as well.  She also had fever along with it.  She put up with it for 3 days and as she was not getting any better once she came back to [facility]  she came to the ED on 06/22/2018.  She had a heart rate of 163 on arrival and a temperature of 102 while in the ED.  She had a CT of the abdomen and pelvis on the did not show any hydronephrosis or stones.  Blood cultures were sent and she was started on IV ceftriaxone. Her blood culture 1 out of the 2 bottle had staph aureus and ID is doing a consult for the same.   E. coli urosepsis with left pyelonephritis.  Patient has been on ceftriaxone and is responding to the treatment  Staph aureus bacteremia .  Present in the anaerobic bottle of one set.  Even though it is only 1 out of 4 bottles, staph aureus cannot be taken as a contaminant.  She very likely has a reason for this bacteremia even though it is low level.  She has right knee wound in the healing stage and multiple small excoriations and folliculitis on her legs left more than right.  She recently had face abscess/ cellulitis.  In the past 2 months she has been given 2 courses of Bactrim.  She has an involuntary compulsive skin picking habit and she states it is because of stress.  She is very likely colonized with staph aureus. The bacteremia is  low level and there is no evidence of vegetation on 2D echo.  She does not need TEE.  Also the CT of the abdomen was reviewed with the radiologist and the lumbar spine does not have any evidence of discitis.  She will need  10 days of IV antibiotic to treat MSSA bacteremia.  I am concerned about her hygiene and the risk  for PICC line infection secondary infection.  She may benefit from outpatient infusion center once a day antibiotic.  Even though cefazolin has a better activity against MSSA she also has pyelonephritis and she may benefit from once a day antibiotic like ceftriaxone which also covers MSSA.  Unless [facility] visits her in the hospital and teach her  are how to do the IV antibiotics a on IV antibiotics and make sure she is not a risk for infections.  Sinus tachycardia.  Will check  TSH.  Discussed the management with the patient, her nurse and the hospitalist.

## 2018-06-25 ENCOUNTER — Inpatient Hospital Stay: Payer: Self-pay

## 2018-06-25 LAB — CBC
HCT: 32.9 % — ABNORMAL LOW (ref 35.0–47.0)
HEMOGLOBIN: 11.4 g/dL — AB (ref 12.0–16.0)
MCH: 28.5 pg (ref 26.0–34.0)
MCHC: 34.6 g/dL (ref 32.0–36.0)
MCV: 82.3 fL (ref 80.0–100.0)
Platelets: 257 10*3/uL (ref 150–440)
RBC: 4 MIL/uL (ref 3.80–5.20)
RDW: 15.2 % — ABNORMAL HIGH (ref 11.5–14.5)
WBC: 6.3 10*3/uL (ref 3.6–11.0)

## 2018-06-25 LAB — BASIC METABOLIC PANEL
ANION GAP: 4 — AB (ref 5–15)
BUN: 8 mg/dL (ref 6–20)
CALCIUM: 8.2 mg/dL — AB (ref 8.9–10.3)
CO2: 27 mmol/L (ref 22–32)
Chloride: 108 mmol/L (ref 98–111)
Creatinine, Ser: 0.39 mg/dL — ABNORMAL LOW (ref 0.44–1.00)
GLUCOSE: 98 mg/dL (ref 70–99)
Potassium: 3.7 mmol/L (ref 3.5–5.1)
Sodium: 139 mmol/L (ref 135–145)

## 2018-06-25 LAB — CULTURE, BLOOD (ROUTINE X 2): Special Requests: ADEQUATE

## 2018-06-25 LAB — HIV ANTIBODY (ROUTINE TESTING W REFLEX): HIV SCREEN 4TH GENERATION: NONREACTIVE

## 2018-06-25 MED ORDER — CEFAZOLIN SODIUM-DEXTROSE 2-4 GM/100ML-% IV SOLN: 2.0000 g | Freq: Three times a day (TID) | INTRAVENOUS | 0 refills | Status: DC

## 2018-06-25 MED ORDER — SODIUM CHLORIDE 0.9% FLUSH: 10.0000 mL | Freq: Two times a day (BID) | INTRAVENOUS | Status: DC

## 2018-06-25 MED ORDER — CEFAZOLIN IV (FOR PTA / DISCHARGE USE ONLY): 2.0000 g | Freq: Three times a day (TID) | INTRAVENOUS | 0 refills | Status: AC

## 2018-06-25 MED ORDER — SODIUM CHLORIDE 0.9% FLUSH: 10.0000 mL | INTRAVENOUS | Status: DC | PRN

## 2018-06-25 NOTE — Progress Notes (Signed)
Diagnosis: Staphylococcus bacteremia Baseline Creatinine 0.39  Culture Result: MSSA  No Known Allergies  OPAT Orders Discharge antibiotics: Cefazolin 2 grams IV every 8 hours until 07/05/18  Duration: 14 days ( including hospital stay)   [place] Per Protocol:  Labs weekly while on IV antibiotics: _x_ CBC with differential __ BMP _x_ CMP __ CRP __ ESR __ Vancomycin trough  _x_ Please pull PIC at completion of IV antibiotics   Scan and fax labs to [email or web address]  Call Dr.Jkwon Treptow with any critical value or [phone]

## 2018-06-25 NOTE — Care Management Note (Signed)
Case Management Note  Patient Details  Name: Melissa RutherfordHarley R Sill MRN: [id number] Date of Birth: [birth year]  Subjective/Objective:   Services arranged with [facility] for home IV antibiotics. Dr. Rivka Saferavishankar has requested patient be assessed for her ability to self administer home IV antibiotics. Primary nurse, Asher MuirJamie states patient is independent and active. There is some debate by ID MD as to patient being taught first dose [facility] verses at home by home care staff. Patient tearful and ready to go home. Brad with [facility] in to speak with patient at approximately 2:45 pm to see if patient would stay until 4 pm so an [facility] RN could come and initiate hospital teaching regarding IV abx but patient refused and left crying stating she had to go and see her kids. Patient advised if she left she would miss he evening dose likely. She stated she didn't care. [facility] is scheduled to begin IV antibiotics in the am at 10 am.                 Action/Plan:   Expected Discharge Date:  06/25/18               Expected Discharge Plan:  Home w Home Health Services  In-House Referral:     Discharge planning Services  CM Consult  Post Acute Care Choice:  Home Health Choice offered to:  Patient  DME Arranged:    DME Agency:     HH Arranged:  IV Antibiotics, RN HH Agency:  [facility]  Status of Service:  Completed, signed off  If discussed at Long Length of Stay Meetings, dates discussed:    Additional Comments:  Marily MemosLisa M Xavien Dauphinais, RN 06/25/2018, 3:29 PM

## 2018-06-25 NOTE — Progress Notes (Signed)
Many discussions between ID, Dr. Nemiah CommanderKalisetti, and Misty StanleyLisa, Case Manger for patient's pending discharge.  Patient is being discharged home. PICC line for home IV ABX placed. Patient is to be educated on how to give ABX, Brad from [facility] came to tell patient that the they would be here at 1600. Patient became to cry and saying that her ride was here and couldn't stay any longer. Educated patient on basic care for PICC line and covered with gauze. Discussed S&S of infection. Patient stated that she understood. Explained to patient if she left now she would miss a dose. She stated that she didn't care and we could not keep her here. Patient left with discharge paperwork and gave a phone number for ID to call and follow-up appt. [phone]. This number is her grandmother's, because her phone doesn't always work, per patient.

## 2018-06-25 NOTE — Progress Notes (Signed)
PHARMACY CONSULT NOTE FOR:  OUTPATIENT  PARENTERAL ANTIBIOTIC THERAPY (OPAT)  Indication: MSSA bacteremia Regimen: cefazolin 2gm IV q8h End date: 07/05/2018  IV antibiotic discharge orders have been updated to reflect ID recommendations  Thank you for allowing pharmacy to be a part of this patient's care.  Juliette Alcideustin Ismahan Lippman, PharmD, BCPS.   Work Cell: [phone] 06/25/2018 2:39 PM

## 2018-06-25 NOTE — Discharge Summary (Signed)
[facility] [place] [facility]   PATIENT NAME: Melissa Coffey    MR#:  [id number]  DATE OF BIRTH:  10-Sep-1996  DATE OF ADMISSION:  06/22/2018   ADMITTING PHYSICIAN: Shaune PollackQing Chen, MD  DATE OF DISCHARGE:  06/25/18  PRIMARY CARE PHYSICIAN: Patient, No Pcp Per   ADMISSION DIAGNOSIS:   Pyelonephritis [N12] Sepsis, due to unspecified organism (HCC) [A41.9]  DISCHARGE DIAGNOSIS:   Active Problems:   Sepsis (HCC)   SECONDARY DIAGNOSIS:   Past Medical History:  Diagnosis Date  . Anemia   . Depression   . Endometriosis   . UTI (urinary tract infection)     HOSPITAL COURSE:   22 year old female with past medical history significant for chronic anemia, anxiety and depression presents to hospital secondary to worsening flank pain and fevers.  1.  Sepsis-secondary to urinary tract infection. -Urine cultures growing E. coli.  1 out of 4 blood cultures growing MSSA.  Does have some superficial skin wounds from scratching. -Appreciate ID input -Repeat blood cultures are negative.  Currently on Ancef 2 g every 8 hours.  Continue for 14 days. -Urine cultures growing E. coli. -CT of the abdomen showing left-sided pyelonephritis and thickened small bowel. -much improved with fluids and antibiotics.  Will be discharged home today.  PICC line will be placed for IV antibiotics at home. -Echocardiogram did not show any vegetations  2.  SVT-labeled as SVT with increased heart rate on admission.  Appreciate cardiology consult.  Likely sinus tachycardia in the setting of infection and pain -Echocardiogram is normal.   -Resolved with fluids and antibiotics  3.  Anemia of chronic disease-continue iron supplements.  No indication for transfusion  4.  Depression anxiety-stable.  Will be discharged home today   DISCHARGE CONDITIONS:   Guarded  CONSULTS OBTAINED:   Infectious Disease consult  DRUG ALLERGIES:   No Known Allergies DISCHARGE MEDICATIONS:    Allergies as of 06/25/2018   No Known Allergies     Medication List    STOP taking these medications   cyclobenzaprine 10 MG tablet Commonly known as:  FLEXERIL   hydrOXYzine 50 MG tablet Commonly known as:  ATARAX/VISTARIL   naproxen 500 MG tablet Commonly known as:  NAPROSYN   sertraline 50 MG tablet Commonly known as:  ZOLOFT   sulfamethoxazole-trimethoprim 800-160 MG tablet Commonly known as:  BACTRIM DS,SEPTRA DS     TAKE these medications   ceFAZolin 2-4 GM/100ML-% IVPB Commonly known as:  ANCEF Inject 100 mLs (2 g total) into the vein every 8 (eight) hours for 14 days.   ferrous sulfate 325 (65 FE) MG tablet Take 325 mg by mouth 2 (two) times daily with a meal.        DISCHARGE INSTRUCTIONS:   1.  PCP follow-up in 1 to 2 weeks 2.  ID follow-up in 2 weeks  DIET:   Cardiac diet  ACTIVITY:   Activity as tolerated  OXYGEN:   Home Oxygen: No.  Oxygen Delivery: room air  DISCHARGE LOCATION:   home   If you experience worsening of your admission symptoms, develop shortness of breath, life threatening emergency, suicidal or homicidal thoughts you must seek medical attention immediately by calling 911 or calling your MD immediately  if symptoms less severe.  You Must read complete instructions/literature along with all the possible adverse reactions/side effects for all the Medicines you take and that have been prescribed to you. Take any new Medicines after you have completely understood and accpet all  the possible adverse reactions/side effects.   Please note  You were cared for by a hospitalist during your hospital stay. If you have any questions about your discharge medications or the care you received while you were in the hospital after you are discharged, you can call the unit and asked to speak with the hospitalist on call if the hospitalist that took care of you is not available. Once you are discharged, your primary care physician will handle  any further medical issues. Please note that NO REFILLS for any discharge medications will be authorized once you are discharged, as it is imperative that you return to your primary care physician (or establish a relationship with a primary care physician if you do not have one) for your aftercare needs so that they can reassess your need for medications and monitor your lab values.    On the day of Discharge:  VITAL SIGNS:   Blood pressure 104/80, pulse 80, temperature 98.4 F (36.9 C), temperature source Oral, resp. rate 17, height 5\' 4"  (1.626 m), weight 59.9 kg, last menstrual period 06/08/2018, SpO2 100 %, unknown if currently breastfeeding.  PHYSICAL EXAMINATION:    GENERAL:  22 y.o.-year-old patient lying in the bed with no acute distress.  EYES: Pupils equal, round, reactive to light and accommodation. No scleral icterus. Extraocular muscles intact.  HEENT: Head atraumatic, normocephalic. Oropharynx and nasopharynx clear.  NECK:  Supple, no jugular venous distention. No thyroid enlargement, no tenderness.  LUNGS: Normal breath sounds bilaterally, no wheezing, rales,rhonchi or crepitation. No use of accessory muscles of respiration.  CARDIOVASCULAR: S1, S2 normal. No murmurs, rubs, or gallops.  ABDOMEN: Soft, nontender, nondistended. Bowel sounds present. No organomegaly or mass.  no CVA tenderness noted today EXTREMITIES: No pedal edema, cyanosis, or clubbing.  NEUROLOGIC: Cranial nerves II through XII are intact. Muscle strength 5/5 in all extremities. Sensation intact. Gait not checked.  PSYCHIATRIC: The patient is alert and oriented x 3.  SKIN: No obvious rash, lesion, or ulcer.   DATA REVIEW:   CBC Recent Labs  Lab 06/25/18 0459  WBC 6.3  HGB 11.4*  HCT 32.9*  PLT 257    Chemistries  Recent Labs  Lab 06/22/18 1500  06/24/18 0342 06/25/18 0459  NA 134*   < > 138 139  K 3.5   < > 3.5 3.7  CL 102   < > 108 108  CO2 24   < > 24 27  GLUCOSE 120*   < > 88 98  BUN  7   < > 7 8  CREATININE 0.55   < > 0.39* 0.39*  CALCIUM 9.1   < > 7.7* 8.2*  MG  --    < > 1.9  --   AST 16  --   --   --   ALT 16  --   --   --   ALKPHOS 76  --   --   --   BILITOT 1.6*  --   --   --    < > = values in this interval not displayed.     Microbiology Results  Results for orders placed or performed during the hospital encounter of 06/22/18  Urine Culture     Status: Abnormal   Collection Time: 06/22/18  3:00 PM  Result Value Ref Range Status   Specimen Description   Final    URINE, RANDOM Performed at [facility], [address] [place], [place] [address]    Special Requests  Final    NONE Performed at [facility], [address] [place], [place] [address]    Culture >=100,000 COLONIES/mL ESCHERICHIA COLI (A)  Final   Report Status 06/24/2018 FINAL  Final   Organism ID, Bacteria ESCHERICHIA COLI (A)  Final      Susceptibility   Escherichia coli - MIC*    AMPICILLIN >=32 RESISTANT Resistant     CEFAZOLIN <=4 SENSITIVE Sensitive     CEFTRIAXONE <=1 SENSITIVE Sensitive     CIPROFLOXACIN <=0.25 SENSITIVE Sensitive     GENTAMICIN <=1 SENSITIVE Sensitive     IMIPENEM <=0.25 SENSITIVE Sensitive     NITROFURANTOIN <=16 SENSITIVE Sensitive     TRIMETH/SULFA >=320 RESISTANT Resistant     AMPICILLIN/SULBACTAM >=32 RESISTANT Resistant     PIP/TAZO <=4 SENSITIVE Sensitive     Extended ESBL NEGATIVE Sensitive     * >=100,000 COLONIES/mL ESCHERICHIA COLI  Blood culture (routine x 2)     Status: None (Preliminary result)   Collection Time: 06/22/18  3:00 PM  Result Value Ref Range Status   Specimen Description BLOOD RA  Final   Special Requests   Final    BOTTLES DRAWN AEROBIC AND ANAEROBIC Blood Culture adequate volume   Culture   Final    NO GROWTH 3 DAYS Performed at [facility], [address], [place], [place] [address]    Report Status PENDING  Incomplete  Blood culture (routine x 2)     Status: Abnormal   Collection Time:  06/22/18  4:15 PM  Result Value Ref Range Status   Specimen Description   Final    BLOOD LEFT ANTECUBITAL Performed at [facility], [address] [place], [place] [address]    Special Requests   Final    BOTTLES DRAWN AEROBIC AND ANAEROBIC Blood Culture adequate volume Performed at [facility], [address] [place], [place] [address]    Culture  Setup Time   Final    GRAM POSITIVE COCCI ANAEROBIC BOTTLE ONLY CRITICAL RESULT CALLED TO, READ BACK BY AND VERIFIED WITH: [facility] LIPSEE AT 8756 ON 06/23/2018 JJB Performed at [facility], [address] [place], [place] [address]    Culture STAPHYLOCOCCUS AUREUS (A)  Final   Report Status 06/25/2018 FINAL  Final   Organism ID, Bacteria STAPHYLOCOCCUS AUREUS  Final      Susceptibility   Staphylococcus aureus - MIC*    CIPROFLOXACIN <=0.5 SENSITIVE Sensitive     ERYTHROMYCIN <=0.25 SENSITIVE Sensitive     GENTAMICIN <=0.5 SENSITIVE Sensitive     OXACILLIN 0.5 SENSITIVE Sensitive     TETRACYCLINE >=16 RESISTANT Resistant     VANCOMYCIN 1 SENSITIVE Sensitive     TRIMETH/SULFA <=10 SENSITIVE Sensitive     CLINDAMYCIN <=0.25 SENSITIVE Sensitive     RIFAMPIN <=0.5 SENSITIVE Sensitive     Inducible Clindamycin NEGATIVE Sensitive     * STAPHYLOCOCCUS AUREUS  Blood Culture ID Panel (Reflexed)     Status: Abnormal   Collection Time: 06/22/18  4:15 PM  Result Value Ref Range Status   Enterococcus species NOT DETECTED NOT DETECTED Final   Listeria monocytogenes NOT DETECTED NOT DETECTED Final   Staphylococcus species DETECTED (A) NOT DETECTED Final    Comment: CRITICAL RESULT CALLED TO, READ BACK BY AND VERIFIED WITH: HANNAH LIPSEE AT 0850 ON 06/23/2018 JJB    Staphylococcus aureus DETECTED (A) NOT DETECTED Final    Comment: Methicillin (oxacillin) susceptible Staphylococcus aureus (MSSA). Preferred therapy is anti staphylococcal beta lactam  antibiotic (Cefazolin or Nafcillin), unless clinically  contraindicated. CRITICAL RESULT CALLED TO, READ BACK BY AND VERIFIED WITH: HANNAH LIPSEE AT 1610 ON 06/23/2018 JJB    Methicillin resistance NOT DETECTED NOT DETECTED Final   Streptococcus species NOT DETECTED NOT DETECTED Final   Streptococcus agalactiae NOT DETECTED NOT DETECTED Final   Streptococcus pneumoniae NOT DETECTED NOT DETECTED Final   Streptococcus pyogenes NOT DETECTED NOT DETECTED Final   Acinetobacter baumannii NOT DETECTED NOT DETECTED Final   Enterobacteriaceae species NOT DETECTED NOT DETECTED Final   Enterobacter cloacae complex NOT DETECTED NOT DETECTED Final   Escherichia coli NOT DETECTED NOT DETECTED Final   Klebsiella oxytoca NOT DETECTED NOT DETECTED Final   Klebsiella pneumoniae NOT DETECTED NOT DETECTED Final   Proteus species NOT DETECTED NOT DETECTED Final   Serratia marcescens NOT DETECTED NOT DETECTED Final   Haemophilus influenzae NOT DETECTED NOT DETECTED Final   Neisseria meningitidis NOT DETECTED NOT DETECTED Final   Pseudomonas aeruginosa NOT DETECTED NOT DETECTED Final   Candida albicans NOT DETECTED NOT DETECTED Final   Candida glabrata NOT DETECTED NOT DETECTED Final   Candida krusei NOT DETECTED NOT DETECTED Final   Candida parapsilosis NOT DETECTED NOT DETECTED Final   Candida tropicalis NOT DETECTED NOT DETECTED Final    Comment: Performed at [facility], [address] [place], [place] [address]  CULTURE, BLOOD (ROUTINE X 2) w Reflex to ID Panel     Status: None (Preliminary result)   Collection Time: 06/24/18  7:56 AM  Result Value Ref Range Status   Specimen Description BLOOD  Final   Special Requests NONE  Final   Culture   Final    NO GROWTH < 24 HOURS Performed at [facility], [address] [place], [place] [address]    Report Status PENDING  Incomplete  CULTURE, BLOOD (ROUTINE X 2) w Reflex to ID Panel     Status: None (Preliminary result)   Collection Time: 06/24/18  7:56 AM  Result Value Ref Range Status    Specimen Description BLOOD  Final   Special Requests NONE  Final   Culture   Final    NO GROWTH < 24 HOURS Performed at [facility], [address] [place], [place] [address]    Report Status PENDING  Incomplete  Aerobic Culture (superficial specimen)     Status: None (Preliminary result)   Collection Time: 06/24/18  4:13 PM  Result Value Ref Range Status   Specimen Description   Final    KNEE Performed at [facility], [address] [place], [place] [address]    Special Requests   Final    NONE Performed at [facility], [address] [place], [place] [address]    Gram Stain   Final    NO WBC SEEN NO ORGANISMS SEEN Performed at [facility], [address], [place], [place] [address]    Culture PENDING  Incomplete   Report Status PENDING  Incomplete    RADIOLOGY:  [place] Ekg Site Rite  Result Date: 06/25/2018 If Site Rite image not attached, placement could not be confirmed due to current cardiac rhythm.    Management plans discussed with the patient, family and they are in agreement.  CODE STATUS:     Code Status Orders  (From admission, onward)         Start     Ordered   06/22/18 2021  Full code  Continuous     06/22/18 2021  Code Status History    Date Active Date Inactive Code Status Order ID Comments User Context   05/09/2016 1745 05/09/2016 2315 Full Code [id number]  Conard Novak, MD Inpatient   03/22/2016 1912 03/23/2016 0058 Full Code [id number]  Nadara Mustard, MD Inpatient   11/11/2015 1423 11/12/2015 1343 Full Code [id number]  [place] Bing, MD ED      TOTAL TIME TAKING CARE OF THIS PATIENT: 38 minutes.    Enid Baas M.D on 06/25/2018 at 10:48 AM  Between 7am to 6pm - Pager - [phone]  After 6pm go to [email or web address] - Social research officer, government  [facility] [place] [facility]  Office  [phone]  CC: Primary care physician; Patient, No Pcp Per   Note: This dictation was  prepared with Dragon dictation along with smaller phrase technology. Any transcriptional errors that result from this process are unintentional.

## 2018-06-25 NOTE — Progress Notes (Signed)
Peripherally Inserted Central Catheter/Midline Placement  The IV Nurse has discussed with the patient and/or persons authorized to consent for the patient, the purpose of this procedure and the potential benefits and risks involved with this procedure.  The benefits include less needle sticks, lab draws from the catheter, and the patient may be discharged home with the catheter. Risks include, but not limited to, infection, bleeding, blood clot (thrombus formation), and puncture of an artery; nerve damage and irregular heartbeat and possibility to perform a PICC exchange if needed/ordered by physician.  Alternatives to this procedure were also discussed.  Bard Power PICC patient education guide, fact sheet on infection prevention and patient information card has been provided to patient /or left at bedside.    PICC/Midline Placement Documentation  PICC Single Lumen 06/25/18 PICC Right Brachial 31 cm 1 cm (Active)  Indication for Insertion or Continuance of Line Home intravenous therapies (PICC only) 06/25/2018  1:56 PM  Exposed Catheter (cm) 1 cm 06/25/2018  1:56 PM  Site Assessment Clean;Dry;Intact 06/25/2018  1:56 PM  Line Status Flushed;Saline locked;Blood return noted 06/25/2018  1:56 PM  Dressing Type Transparent 06/25/2018  1:56 PM  Dressing Status Clean;Dry;Intact 06/25/2018  1:56 PM  Dressing Intervention New dressing 06/25/2018  1:56 PM  Dressing Change Due 07/02/18 06/25/2018  1:56 PM       Fatima Fedie, Lajean ManesKerry Loraine 06/25/2018, 1:57 PM

## 2018-06-25 NOTE — Progress Notes (Signed)
[facility]  [facility] for home IVABX at DC today for her first home dose at 10 PM tonight. [facility] will provide in hospital teaching with pt prior to DC to support independence at home with IV ABX.  HHRN will make visit tomorrow to continue teaching and [facility].  If patient discharges after hours, please call [phone].   Sedalia Mutaamela S Chandler 06/25/2018, 2:23 PM

## 2018-06-26 LAB — HIV ANTIBODY (ROUTINE TESTING W REFLEX): HIV SCREEN 4TH GENERATION: NONREACTIVE

## 2018-06-27 LAB — CULTURE, BLOOD (ROUTINE X 2)
CULTURE: NO GROWTH
Special Requests: ADEQUATE

## 2018-06-27 LAB — AEROBIC CULTURE W GRAM STAIN (SUPERFICIAL SPECIMEN)
Culture: NO GROWTH
Gram Stain: NONE SEEN

## 2018-06-27 LAB — AEROBIC CULTURE  (SUPERFICIAL SPECIMEN)

## 2018-06-29 LAB — CULTURE, BLOOD (ROUTINE X 2)
CULTURE: NO GROWTH
CULTURE: NO GROWTH

## 2018-07-26 ENCOUNTER — Other Ambulatory Visit: Payer: Self-pay

## 2018-07-26 ENCOUNTER — Encounter: Payer: Self-pay | Admitting: Emergency Medicine

## 2018-07-26 ENCOUNTER — Emergency Department
Admission: EM | Admit: 2018-07-26 | Discharge: 2018-07-26 | Disposition: A | Discharge status: Home / Self Care | Payer: Managed Care, Other (non HMO) | Attending: Emergency Medicine | Admitting: Emergency Medicine

## 2018-07-26 DIAGNOSIS — E876 Hypokalemia: Secondary | ICD-10-CM | POA: Insufficient documentation

## 2018-07-26 DIAGNOSIS — F17210 Nicotine dependence, cigarettes, uncomplicated: Secondary | ICD-10-CM | POA: Insufficient documentation

## 2018-07-26 DIAGNOSIS — F1520 Other stimulant dependence, uncomplicated: Secondary | ICD-10-CM | POA: Insufficient documentation

## 2018-07-26 LAB — CBC WITH DIFFERENTIAL/PLATELET
Basophils Absolute: 0.1 10*3/uL (ref 0–0.1)
Basophils Relative: 1 %
Eosinophils Absolute: 0 10*3/uL (ref 0–0.7)
Eosinophils Relative: 0 %
HEMATOCRIT: 38 % (ref 35.0–47.0)
Hemoglobin: 13.1 g/dL (ref 12.0–16.0)
LYMPHS PCT: 30 %
Lymphs Abs: 3.1 10*3/uL (ref 1.0–3.6)
MCH: 28.4 pg (ref 26.0–34.0)
MCHC: 34.5 g/dL (ref 32.0–36.0)
MCV: 82.4 fL (ref 80.0–100.0)
Monocytes Absolute: 0.6 10*3/uL (ref 0.2–0.9)
Monocytes Relative: 6 %
NEUTROS ABS: 6.3 10*3/uL (ref 1.4–6.5)
Neutrophils Relative %: 63 %
Platelets: 348 10*3/uL (ref 150–440)
RBC: 4.61 MIL/uL (ref 3.80–5.20)
RDW: 14.3 % (ref 11.5–14.5)
WBC: 10.1 10*3/uL (ref 3.6–11.0)

## 2018-07-26 LAB — BASIC METABOLIC PANEL
ANION GAP: 10 (ref 5–15)
BUN: 20 mg/dL (ref 6–20)
CO2: 24 mmol/L (ref 22–32)
Calcium: 9.3 mg/dL (ref 8.9–10.3)
Chloride: 104 mmol/L (ref 98–111)
Creatinine, Ser: 0.8 mg/dL (ref 0.44–1.00)
GFR calc Af Amer: >60 mL/min (ref >60)
GLUCOSE: 102 mg/dL — AB (ref 70–99)
POTASSIUM: 3.2 mmol/L — AB (ref 3.5–5.1)
Sodium: 138 mmol/L (ref 135–145)

## 2018-07-26 LAB — SALICYLATE LEVEL

## 2018-07-26 LAB — ACETAMINOPHEN LEVEL: Acetaminophen (Tylenol), Serum: <10 ug/mL — ABNORMAL LOW (ref 10–30)

## 2018-07-26 LAB — ETHANOL: Alcohol, Ethyl (B): <10 mg/dL (ref <10)

## 2018-07-26 MED ORDER — LORAZEPAM 1 MG PO TABS
1.0000 mg | ORAL_TABLET | Freq: Once | ORAL | Status: AC
  Administered 2018-07-26: 1 mg via ORAL
  Filled 2018-07-26: qty 1

## 2018-07-26 NOTE — ED Triage Notes (Signed)
Pt presents to ED requesting to go to rehab for drug use. Has been smoking meth daily for about a year. Denies SI or HI currently. Pt states she has never been to rehab before.

## 2018-07-26 NOTE — ED Notes (Signed)
TTS up to speak with pt and gave information about rehab/detox facilities.

## 2018-07-26 NOTE — ED Provider Notes (Signed)
[facility] Emergency Department Provider Note   ____________________________________________   First MD Initiated Contact with Patient 07/26/18 0222     (approximate)  I have reviewed the triage vital signs and the nursing notes.   HISTORY  Chief Complaint Medical Clearance    HPI Melissa Coffey is a 22 y.o. female who presents to the ED from home accompanied by her mother and grandmother requesting detox from methamphetamine use.  Reports smoking meth daily for about a year.  Denies SI/HI/AH/VH.  Voices no medical complaints.  Specifically, denies recent fever, chills, chest pain, shortness of breath, abdominal pain, nausea or vomiting.  Denies recent travel or trauma.   Past Medical History:  Diagnosis Date  . Anemia   . Depression   . Endometriosis   . UTI (urinary tract infection)     Patient Active Problem List   Diagnosis Date Noted  . Sepsis (HCC) 06/22/2018  . Compulsive skin picking 11/30/2017  . Anxiety and depression 11/29/2017  . Previous cesarean delivery, antepartum condition or complication 05/09/2016  . Status post cesarean delivery 05/09/2016  . Anemia in pregnancy 11/11/2015    Past Surgical History:  Procedure Laterality Date  . ABDOMINAL SURGERY    . CESAREAN SECTION  02/2014   pLTCS. FITL at term  . CESAREAN SECTION N/A 05/09/2016   Procedure: CESAREAN SECTION;  Surgeon: Conard Novak, MD;  Location: [facility] ORS;  Service: Obstetrics;  Laterality: N/A;  . NECK SURGERY  2003  . TONSILLECTOMY      Prior to Admission medications   Medication Sig Start Date End Date Taking? Authorizing Provider  ferrous sulfate 325 (65 FE) MG tablet Take 325 mg by mouth 2 (two) times daily with a meal.     [provider]    Allergies Patient has no known allergies.  Family History  Problem Relation Age of Onset  . Diabetes Maternal Grandmother   . Hypertension Maternal Grandmother   . Cancer Maternal Grandmother   .  Breast cancer Maternal Grandmother   . Diabetes Maternal Uncle   . Healthy Mother   . Healthy Brother   . Healthy Daughter   . Healthy Son   . Healthy Brother   . Stroke Neg Hx   . Heart attack Neg Hx   . Colon cancer Neg Hx   . Ovarian cancer Neg Hx     Social History Social History   Tobacco Use  . Smoking status: Current Every Day Smoker    Packs/day: 0.50    Years: 0.50    Pack years: 0.25    Types: Cigarettes  . Smokeless tobacco: Never Used  Substance Use Topics  . Alcohol use: No  . Drug use: Yes    Review of Systems  Constitutional: No fever/chills Eyes: No visual changes. ENT: No sore throat. Cardiovascular: Denies chest pain. Respiratory: Denies shortness of breath. Gastrointestinal: No abdominal pain.  No nausea, no vomiting.  No diarrhea.  No constipation. Genitourinary: Negative for dysuria. Musculoskeletal: Negative for back pain. Skin: Negative for rash. Neurological: Negative for headaches, focal weakness or numbness. Psychiatric:Positive for substance abuse.  ____________________________________________   PHYSICAL EXAM:  VITAL SIGNS: ED Triage Vitals  Enc Vitals Group     BP 07/26/18 0123 (!) 131/101     Pulse Rate 07/26/18 0123 (!) 124     Resp 07/26/18 0123 20     Temp --      Temp Source 07/26/18 0123 Oral     SpO2 07/26/18  0123 100 %     Weight 07/26/18 0124 130 lb (59 kg)     Height 07/26/18 0124 5\' 3"  (1.6 m)     Head Circumference --      Peak Flow --      Pain Score 07/26/18 0123 0     Pain Loc --      Pain Edu? --      Excl. in GC? --     Constitutional: Alert and oriented. Well appearing and in no acute distress. Eyes: Conjunctivae are normal. PERRL. EOMI. Head: Atraumatic. Nose: No congestion/rhinnorhea. Mouth/Throat: Mucous membranes are moist.  Oropharynx non-erythematous. Neck: No stridor.   Cardiovascular: Normal rate, regular rhythm. Grossly normal heart sounds.  Good peripheral circulation. Respiratory: Normal  respiratory effort.  No retractions. Lungs CTAB. Gastrointestinal: Soft and nontender. No distention. No abdominal bruits. No CVA tenderness. Musculoskeletal: No lower extremity tenderness nor edema.  No joint effusions. Neurologic:  Normal speech and language. No gross focal neurologic deficits are appreciated. No gait instability. Skin:  Skin is warm, dry and intact. No rash noted.  Multiple skin lesions on face and arms consistent with methamphetamine abuse. Psychiatric: Mood and affect are normal. Speech and behavior are normal.  ____________________________________________   LABS (all labs ordered are listed, but only abnormal results are displayed)  Labs Reviewed  BASIC METABOLIC PANEL - Abnormal; Notable for the following components:      Result Value   Potassium 3.2 (*)    Glucose, Bld 102 (*)    All other components within normal limits  ACETAMINOPHEN LEVEL - Abnormal; Notable for the following components:   Acetaminophen (Tylenol), Serum <10 (*)    All other components within normal limits  CBC WITH DIFFERENTIAL/PLATELET  ETHANOL  SALICYLATE LEVEL  URINALYSIS, COMPLETE (UACMP) WITH MICROSCOPIC  URINE DRUG SCREEN, QUALITATIVE ([facility] ONLY)   ____________________________________________  EKG  None ____________________________________________  RADIOLOGY  ED MD interpretation: None  Official radiology report(s): No results found.  ____________________________________________   PROCEDURES  Procedure(s) performed: None  Procedures  Critical Care performed: No  ____________________________________________   INITIAL IMPRESSION / ASSESSMENT AND PLAN / ED COURSE  As part of my medical decision making, I reviewed the following data within the electronic MEDICAL RECORD NUMBER History obtained from family, Nursing notes reviewed and incorporated, Labs reviewed, Old chart reviewed and Notes from prior ED visits   22 year old female who presents for methamphetamine  detox.  Laboratory salts remarkable for mild hypokalemia.  TTS was consulted and gave patient resources for outpatient detox.  Encourage patient to orally hydrate.  Strict return precautions given.  Patient and family members verbalized understanding and agree with plan of care.     ____________________________________________   FINAL CLINICAL IMPRESSION(S) / ED DIAGNOSES  Final diagnoses:  Amphetamine addiction (HCC)  Hypokalemia     ED Discharge Orders    None       Note:  This document was prepared using Dragon voice recognition software and may include unintentional dictation errors.    Irean Hong, MD 07/26/18 [phone]

## 2018-07-26 NOTE — Discharge Instructions (Addendum)
Please drink plenty of fluids daily and eat foods which are rich in potassium.  Return to the ER for worsening symptoms, feelings of hurting yourself or others, or other concerns.

## 2018-10-11 ENCOUNTER — Ambulatory Visit (INDEPENDENT_AMBULATORY_CARE_PROVIDER_SITE_OTHER): Payer: Managed Care, Other (non HMO) | Admitting: Advanced Practice Midwife

## 2018-10-11 ENCOUNTER — Other Ambulatory Visit: Payer: Self-pay | Admitting: Advanced Practice Midwife

## 2018-10-11 ENCOUNTER — Ambulatory Visit (INDEPENDENT_AMBULATORY_CARE_PROVIDER_SITE_OTHER): Payer: Managed Care, Other (non HMO)

## 2018-10-11 ENCOUNTER — Encounter: Payer: Self-pay | Admitting: Advanced Practice Midwife

## 2018-10-11 ENCOUNTER — Other Ambulatory Visit (HOSPITAL_COMMUNITY)
Admission: RE | Admit: 2018-10-11 | Discharge: 2018-10-11 | Disposition: A | Discharge status: Home / Self Care | Payer: Managed Care, Other (non HMO) | Source: Ambulatory Visit | Attending: Advanced Practice Midwife | Admitting: Advanced Practice Midwife

## 2018-10-11 VITALS — BP 100/60 | Wt 156.0 lb

## 2018-10-11 DIAGNOSIS — Z01419 Encounter for gynecological examination (general) (routine) without abnormal findings: Secondary | ICD-10-CM | POA: Diagnosis not present

## 2018-10-11 DIAGNOSIS — Z113 Encounter for screening for infections with a predominantly sexual mode of transmission: Secondary | ICD-10-CM

## 2018-10-11 DIAGNOSIS — N8312 Corpus luteum cyst of left ovary: Secondary | ICD-10-CM | POA: Diagnosis not present

## 2018-10-11 DIAGNOSIS — O0990 Supervision of high risk pregnancy, unspecified, unspecified trimester: Secondary | ICD-10-CM

## 2018-10-11 DIAGNOSIS — Z3202 Encounter for pregnancy test, result negative: Secondary | ICD-10-CM

## 2018-10-11 DIAGNOSIS — Z124 Encounter for screening for malignant neoplasm of cervix: Secondary | ICD-10-CM | POA: Insufficient documentation

## 2018-10-11 DIAGNOSIS — O3680X0 Pregnancy with inconclusive fetal viability, not applicable or unspecified: Secondary | ICD-10-CM

## 2018-10-11 DIAGNOSIS — O3481 Maternal care for other abnormalities of pelvic organs, first trimester: Secondary | ICD-10-CM

## 2018-10-11 DIAGNOSIS — N926 Irregular menstruation, unspecified: Secondary | ICD-10-CM

## 2018-10-11 LAB — POCT URINE PREGNANCY: Preg Test, Ur: NEGATIVE

## 2018-10-11 NOTE — Progress Notes (Addendum)
Gynecology Annual Exam  PCP: Patient, No Pcp Per  Chief Complaint:  Chief Complaint  Patient presents with  . Initial Prenatal Visit    History of Present Illness: Patient is a 22 y.o. [id number] presents for annual exam. The patient was scheduled for NOB visit today. Her reported LMP was in the last week of October but uncertain exact date. She took a pregnancy test a few weeks later which was positive. On the 15th of November she had 4 days of bleeding that was heavy enough to soak a tampon Q 2 hours. She noticed a clot during the 4 days of bleeding. [facility] where she had a positive urine pregnancy test but no imaging was done. Today her urine pregnancy test is negative and ultrasound shows no pregnancy present. She and her partner were not planning a pregnancy but were accepting of it. She is sad with the news of no pregnancy today. She has no other concerns today. Precautions reviewed.   LMP: Patient's last menstrual period was 08/19/2018 (lmp unknown). Menarche:not applicable Average Interval: regular, 28 days Duration of flow: 4 days Heavy Menses: no Clots: no Intermenstrual Bleeding: no Postcoital Bleeding: no Dysmenorrhea: no  The patient is sexually active. She currently uses none for contraception. She denies dyspareunia.  The patient does perform self breast exams.  There is no notable family history of breast or ovarian cancer in her family.  The patient wears seatbelts: yes.  The patient has regular exercise: she is active with young children. She does not focus on healthy foods. She denies adequate hydration with h2o.    The patient denies current symptoms of depression.    Review of Systems: Review of Systems  Constitutional: Negative.   HENT: Negative.   Eyes: Negative.   Respiratory: Negative.   Cardiovascular: Negative.   Gastrointestinal: Positive for nausea.  Genitourinary: Negative.   Musculoskeletal: Negative.   Skin:  Negative.   Neurological: Negative.   Endo/Heme/Allergies: Negative.   Psychiatric/Behavioral: Negative.     Past Medical History:  Past Medical History:  Diagnosis Date  . Anemia   . Depression   . Endometriosis   . UTI (urinary tract infection)     Past Surgical History:  Past Surgical History:  Procedure Laterality Date  . ABDOMINAL SURGERY    . CESAREAN SECTION  02/2014   pLTCS. FITL at term  . CESAREAN SECTION N/A 05/09/2016   Procedure: CESAREAN SECTION;  Surgeon: Conard Novak, MD;  Location: [facility] ORS;  Service: Obstetrics;  Laterality: N/A;  . NECK SURGERY  2003  . TONSILLECTOMY      Gynecologic History:  Patient's last menstrual period was 08/19/2018 (lmp unknown). Contraception: none Last Pap: 2 years ago Results were:  no abnormalities   Obstetric History: [id number]  Family History:  Family History  Problem Relation Age of Onset  . Diabetes Maternal Grandmother   . Hypertension Maternal Grandmother   . Cancer Maternal Grandmother   . Breast cancer Maternal Grandmother   . Diabetes Maternal Uncle   . Healthy Mother   . Healthy Brother   . Healthy Daughter   . Healthy Son   . Healthy Brother   . Stroke Neg Hx   . Heart attack Neg Hx   . Colon cancer Neg Hx   . Ovarian cancer Neg Hx     Social History:  Social History   Socioeconomic History  . Marital status: Single    Spouse name:  Not on file  . Number of children: 2  . Years of education: Not on file  . Highest education level: High school graduate  Occupational History  . Not on file  Social Needs  . Financial resource strain: Not on file  . Food insecurity:    Worry: Not on file    Inability: Not on file  . Transportation needs:    Medical: Not on file    Non-medical: Not on file  Tobacco Use  . Smoking status: Current Every Day Smoker    Packs/day: 0.50    Years: 0.50    Pack years: 0.25    Types: Cigarettes  . Smokeless tobacco: Never Used  Substance and Sexual Activity  .  Alcohol use: No  . Drug use: Yes  . Sexual activity: Yes  Lifestyle  . Physical activity:    Days per week: Not on file    Minutes per session: Not on file  . Stress: Not on file  Relationships  . Social connections:    Talks on phone: Not on file    Gets together: Not on file    Attends religious service: Not on file    Active member of club or organization: Not on file    Attends meetings of clubs or organizations: Not on file    Relationship status: Not on file  . Intimate partner violence:    Fear of current or ex partner: Not on file    Emotionally abused: Not on file    Physically abused: Not on file    Forced sexual activity: Not on file  Other Topics Concern  . Not on file  Social History Narrative  . Not on file    Allergies:  No Known Allergies  Medications: Prior to Admission medications   Medication Sig Start Date End Date Taking? Authorizing Provider  ferrous sulfate 325 (65 FE) MG tablet Take 325 mg by mouth 2 (two) times daily with a meal.     [provider]    Physical Exam Vitals: Blood pressure 100/60, weight 156 lb (70.8 kg), last menstrual period 08/19/2018  General: NAD HEENT: normocephalic, anicteric Thyroid: no enlargement, no palpable nodules Pulmonary: No increased work of breathing, CTAB Cardiovascular: RRR, distal pulses 2+ Breast: Breast symmetrical, no tenderness, no palpable nodules or masses, no skin or nipple retraction present, no nipple discharge.  No axillary or supraclavicular lymphadenopathy. Abdomen: NABS, soft, non-tender, non-distended.  Umbilicus without lesions.  No hepatomegaly, splenomegaly or masses palpable. No evidence of hernia  Genitourinary:  External: Normal external female genitalia.  Normal urethral meatus, normal Bartholin's and Skene's glands.    Vagina: Normal vaginal mucosa, no evidence of prolapse.    Cervix: Grossly normal in appearance, no bleeding, no CMT  Uterus: Non-enlarged, mobile, normal  contour.    Adnexa: ovaries non-enlarged, no adnexal masses  Rectal: deferred  Lymphatic: no evidence of inguinal lymphadenopathy Extremities: no edema, erythema, or tenderness Neurologic: Grossly intact Psychiatric: mood appropriate, affect full  POCT urine pregnancy: negative  Patient Name: CORIE ALLIS DOB: 05/25/1996 MRN: [id number] ULTRASOUND REPORT  Location: [facility] Date of Service: 10/11/2018   Indications:dating Findings:  No intrauterine pregnancy seen on today's ultrasound. Endometrium measuring 7.57mm.  Right Ovary is normal in appearance. Left Ovary is normal appearance. Corpus luteal cyst:  Left ovary Survey of the adnexa demonstrates no adnexal masses. Small amount of free fluid within the cul de sac.  Impression: 1. No intrauterine pregnancy seen.  Recommendations: 1.Clinical correlation with  the patient's History and Physical Exam.  Darlina Guysbby M Clarke, RDMS RVT   Assessment: 22 y.o. [phone] routine annual exam  Plan: Problem List Items Addressed This Visit    None    Visit Diagnoses    Well woman exam with routine gynecological exam    -  Primary   Relevant Orders   Cytology - PAP   Screen for sexually transmitted diseases       Relevant Orders   Cytology - PAP   Cervical cancer screening       Relevant Orders   Cytology - PAP   Missed period       Relevant Orders   POCT urine pregnancy (Completed)      1) 4) Gardasil Series discussed and if applicable offered to patient - Patient has not previously completed 3 shot series   2) STI screening  was offered and accepted  3)  ASCCP guidelines and rational discussed.  Patient opts for every 3 years screening interval  4) Contraception - the patient is currently using  none.  She is attempting to conceive in the near future We discussed safe sex practices to reduce her furture risk of STI's.    5) Return in about 1 year (around 10/12/2019) for annual established gyn.   Tresea MallJane  Marne Meline, CNM [facility], [facility] 10/11/2018, 1:53 PM

## 2018-10-11 NOTE — Patient Instructions (Signed)
Prenatal Care WHAT IS PRENATAL CARE? Prenatal care is the process of caring for a pregnant woman before she gives birth. Prenatal care makes sure that she and her baby remain as healthy as possible throughout pregnancy. Prenatal care may be provided by a midwife, family practice health care provider, or a childbirth and pregnancy specialist (obstetrician). Prenatal care may include physical examinations, testing, treatments, and education on nutrition, lifestyle, and social support services. WHY IS PRENATAL CARE SO IMPORTANT? Early and consistent prenatal care increases the chance that you and your baby will remain healthy throughout your pregnancy. This type of care also decreases a baby's risk of being born too early (prematurely), or being born smaller than expected (small for gestational age). Any underlying medical conditions you may have that could pose a risk during your pregnancy are discussed during prenatal care visits. You will also be monitored regularly for any new conditions that may arise during your pregnancy so they can be treated quickly and effectively. WHAT HAPPENS DURING PRENATAL CARE VISITS? Prenatal care visits may include the following: Discussion Tell your health care provider about any new signs or symptoms you have experienced since your last visit. These might include:  Nausea or vomiting.  Increased or decreased level of energy.  Difficulty sleeping.  Back or leg pain.  Weight changes.  Frequent urination.  Shortness of breath with physical activity.  Changes in your skin, such as the development of a rash or itchiness.  Vaginal discharge or bleeding.  Feelings of excitement or nervousness.  Changes in your baby's movements.  You may want to write down any questions or topics you want to discuss with your health care provider and bring them with you to your appointment. Examination During your first prenatal care visit, you will likely have a complete  physical exam. Your health care provider will often examine your vagina, cervix, and the position of your uterus, as well as check your heart, lungs, and other body systems. As your pregnancy progresses, your health care provider will measure the size of your uterus and your baby's position inside your uterus. He or she may also examine you for early signs of labor. Your prenatal visits may also include checking your blood pressure and, after about 10-12 weeks of pregnancy, listening to your baby's heartbeat. Testing Regular testing often includes:  Urinalysis. This checks your urine for glucose, protein, or signs of infection.  Blood count. This checks the levels of white and red blood cells in your body.  Tests for sexually transmitted infections (STIs). Testing for STIs at the beginning of pregnancy is routinely done and is required in many states.  Antibody testing. You will be checked to see if you are immune to certain illnesses, such as rubella, that can affect a developing fetus.  Glucose screen. Around 24-28 weeks of pregnancy, your blood glucose level will be checked for signs of gestational diabetes. Follow-up tests may be recommended.  Group B strep. This is a bacteria that is commonly found inside a woman's vagina. This test will inform your health care provider if you need an antibiotic to reduce the amount of this bacteria in your body prior to labor and childbirth.  Ultrasound. Many pregnant women undergo an ultrasound screening around 18-20 weeks of pregnancy to evaluate the health of the fetus and check for any developmental abnormalities.  HIV (human immunodeficiency virus) testing. Early in your pregnancy, you will be screened for HIV. If you are at high risk for HIV, this test may   be repeated during your third trimester of pregnancy.  You may be offered other testing based on your age, personal or family medical history, or other factors. HOW OFTEN SHOULD I PLAN TO SEE MY  HEALTH CARE PROVIDER FOR PRENATAL CARE? Your prenatal care check-up schedule depends on any medical conditions you have before, or develop during, your pregnancy. If you do not have any underlying medical conditions, you will likely be seen for checkups:  Monthly, during the first 6 months of pregnancy.  Twice a month during months 7 and 8 of pregnancy.  Weekly starting in the 9th month of pregnancy and until delivery.  If you develop signs of early labor or other concerning signs or symptoms, you may need to see your health care provider more often. Ask your health care provider what prenatal care schedule is best for you. WHAT CAN I DO TO KEEP MYSELF AND MY BABY AS HEALTHY AS POSSIBLE DURING MY PREGNANCY?  Take a prenatal vitamin containing 400 micrograms (0.4 mg) of folic acid every day. Your health care provider may also ask you to take additional vitamins such as iodine, vitamin D, iron, copper, and zinc.  Take 1500-2000 mg of calcium daily starting at your 20th week of pregnancy until you deliver your baby.  Make sure you are up to date on your vaccinations. Unless directed otherwise by your health care provider: ? You should receive a tetanus, diphtheria, and pertussis (Tdap) vaccination between the 27th and 36th week of your pregnancy, regardless of when your last Tdap immunization occurred. This helps protect your baby from whooping cough (pertussis) after he or she is born. ? You should receive an annual inactivated influenza vaccine (IIV) to help protect you and your baby from influenza. This can be done at any point during your pregnancy.  Eat a well-rounded diet that includes: ? Fresh fruits and vegetables. ? Lean proteins. ? Calcium-rich foods such as milk, yogurt, hard cheeses, and dark, leafy greens. ? Whole grain breads.  Do noteat seafood high in mercury, including: ? Swordfish. ? Tilefish. ? Shark. ? King mackerel. ? More than 6 oz tuna per week.  Do not  eat: ? Raw or undercooked meats or eggs. ? Unpasteurized foods, such as soft cheeses (brie, blue, or feta), juices, and milks. ? Lunch meats. ? Hot dogs that have not been heated until they are steaming.  Drink enough water to keep your urine clear or pale yellow. For many women, this may be 10 or more 8 oz glasses of water each day. Keeping yourself hydrated helps deliver nutrients to your baby and may prevent the start of pre-term uterine contractions.  Do not use any tobacco products including cigarettes, chewing tobacco, or electronic cigarettes. If you need help quitting, ask your health care provider.  Do not drink beverages containing alcohol. No safe level of alcohol consumption during pregnancy has been determined.  Do not use any illegal drugs. These can harm your developing baby or cause a miscarriage.  Ask your health care provider or pharmacist before taking any prescription or over-the-counter medicines, herbs, or supplements.  Limit your caffeine intake to no more than 200 mg per day.  Exercise. Unless told otherwise by your health care provider, try to get 30 minutes of moderate exercise most days of the week. Do not  do high-impact activities, contact sports, or activities with a high risk of falling, such as horseback riding or downhill skiing.  Get plenty of rest.  Avoid anything that raises your   body temperature, such as hot tubs and saunas.  If you own a cat, do not empty its litter box. Bacteria contained in cat feces can cause an infection called toxoplasmosis. This can result in serious harm to the fetus.  Stay away from chemicals such as insecticides, lead, mercury, and cleaning or paint products that contain solvents.  Do not have any X-rays taken unless medically necessary.  Take a childbirth and breastfeeding preparation class. Ask your health care provider if you need a referral or recommendation.  This information is not intended to replace advice given  to you by your health care provider. Make sure you discuss any questions you have with your health care provider. Document Released: 10/19/2003 Document Revised: 03/20/2016 Document Reviewed: 12/31/2013 Elsevier Interactive Patient Education  2017 [employer] Eating Plan for Pregnant Women While you are pregnant, your body will require additional nutrition to help support your growing baby. It is recommended that you consume:  150 additional calories each day during your first trimester.  300 additional calories each day during your second trimester.  300 additional calories each day during your third trimester.  Eating a healthy, well-balanced diet is very important for your health and for your baby's health. You also have a higher need for some vitamins and minerals, such as folic acid, calcium, iron, and vitamin D. What do I need to know about eating during pregnancy?  Do not try to lose weight or go on a diet during pregnancy.  Choose healthy, nutritious foods. Choose  of a sandwich with a glass of milk instead of a candy bar or a high-calorie sugar-sweetened beverage.  Limit your overall intake of foods that have "empty calories." These are foods that have little nutritional value, such as sweets, desserts, candies, sugar-sweetened beverages, and fried foods.  Eat a variety of foods, especially fruits and vegetables.  Take a prenatal vitamin to help meet the additional needs during pregnancy, specifically for folic acid, iron, calcium, and vitamin D.  Remember to stay active. Ask your health care provider for exercise recommendations that are specific to you.  Practice good food safety and cleanliness, such as washing your hands before you eat and after you prepare raw meat. This helps to prevent foodborne illnesses, such as listeriosis, that can be very dangerous for your baby. Ask your health care provider for more information about listeriosis. What does 150 extra calories  look like? Healthy options for an additional 150 calories each day could be any of the following:  Plain low-fat yogurt (6-8 oz) with  cup of berries.  1 apple with 2 teaspoons of peanut butter.  Cut-up vegetables with  cup of hummus.  Low-fat chocolate milk (8 oz or 1 cup).  1 string cheese with 1 medium orange.   of a peanut butter and jelly sandwich on whole-wheat bread (1 tsp of peanut butter).  For 300 calories, you could eat two of those healthy options each day. What is a healthy amount of weight to gain? The recommended amount of weight for you to gain is based on your pre-pregnancy BMI. If your pre-pregnancy BMI was:  Less than 18 (underweight), you should gain 28-40 lb.  18-24.9 (normal), you should gain 25-35 lb.  25-29.9 (overweight), you should gain 15-25 lb.  Greater than 30 (obese), you should gain 11-20 lb.  What if I am having twins or multiples? Generally, pregnant women who will be having twins or multiples may need to increase their daily calories by 300-600 calories each   day. The recommended range for total weight gain is 25-54 lb, depending on your pre-pregnancy BMI. Talk with your health care provider for specific guidance about additional nutritional needs, weight gain, and exercise during your pregnancy. What foods can I eat? Grains Any grains. Try to choose whole grains, such as whole-wheat bread, oatmeal, or brown rice. Vegetables Any vegetables. Try to eat a variety of colors and types of vegetables to get a full range of vitamins and minerals. Remember to wash your vegetables well before eating. Fruits Any fruits. Try to eat a variety of colors and types of fruit to get a full range of vitamins and minerals. Remember to wash your fruits well before eating. Meats and Other Protein Sources Lean meats, including chicken, turkey, fish, and lean cuts of beef, veal, or pork. Make sure that all meats are cooked to "well done." Tofu. Tempeh. Beans. Eggs.  Peanut butter and other nut butters. Seafood, such as shrimp, crab, and lobster. If you choose fish, select types that are higher in omega-3 fatty acids, including salmon, herring, mussels, trout, sardines, and pollock. Make sure that all meats are cooked to food-safe temperatures. Dairy Pasteurized milk and milk alternatives. Pasteurized yogurt and pasteurized cheese. Cottage cheese. Sour cream. Beverages Water. Juices that contain 100% fruit juice or vegetable juice. Caffeine-free teas and decaffeinated coffee. Drinks that contain caffeine are okay to drink, but it is better to avoid caffeine. Keep your total caffeine intake to less than 200 mg each day (12 oz of coffee, tea, or soda) or as directed by your health care provider. Condiments Any pasteurized condiments. Sweets and Desserts Any sweets and desserts. Fats and Oils Any fats and oils. The items listed above may not be a complete list of recommended foods or beverages. Contact your dietitian for more options. What foods are not recommended? Vegetables Unpasteurized (raw) vegetable juices. Fruits Unpasteurized (raw) fruit juices. Meats and Other Protein Sources Cured meats that have nitrates, such as bacon, salami, and hotdogs. Luncheon meats, bologna, or other deli meats (unless they are reheated until they are steaming hot). Refrigerated pate, meat spreads from a meat counter, smoked seafood that is found in the refrigerated section of a store. Raw fish, such as sushi or sashimi. High mercury content fish, such as tilefish, shark, swordfish, and king mackerel. Raw meats, such as tuna or beef tartare. Undercooked meats and poultry. Make sure that all meats are cooked to food-safe temperatures. Dairy Unpasteurized (raw) milk and any foods that have raw milk in them. Soft cheeses, such as feta, queso blanco, queso fresco, Brie, Camembert cheeses, blue-veined cheeses, and Panela cheese (unless it is made with pasteurized milk, which must  be stated on the label). Beverages Alcohol. Sugar-sweetened beverages, such as sodas, teas, or energy drinks. Condiments Homemade fermented foods and drinks, such as pickles, sauerkraut, or kombucha drinks. (Store-bought pasteurized versions of these are okay.) Other Salads that are made in the store, such as ham salad, chicken salad, egg salad, tuna salad, and seafood salad. The items listed above may not be a complete list of foods and beverages to avoid. Contact your dietitian for more information. This information is not intended to replace advice given to you by your health care provider. Make sure you discuss any questions you have with your health care provider. Document Released: 07/31/2014 Document Revised: 03/23/2016 Document Reviewed: 03/31/2014 Else[employer] Interactive Patient Education  2018 [employer] Exercise During Pregnancy For people of all ages, exercise is an important part of being healthy. Exercise improves   heart and lung function and helps to maintain strength, flexibility, and a healthy body weight. Exercise also boosts energy levels and elevates mood. For most women, maintaining an exercise routine throughout pregnancy is recommended. It is only on rare occasions and with certain medical conditions or pregnancy complications that women may be asked to limit or avoid exercise during pregnancy. What are some other benefits to exercising during pregnancy? Along with maintaining strength and flexibility, exercising throughout pregnancy can help to:  Keep strength in muscles that are very important during labor and childbirth.  Decrease low back pain during pregnancy.  Decrease the risk of developing gestational diabetes mellitus (GDM).  Improve blood sugar (glucose) control for women who have GDM.  Decrease the risk of developing preeclampsia. This is a serious condition that causes high blood pressure along with other symptoms, such as swelling and  headaches.  Decrease the risk of cesarean delivery.  Speed up the recovery after giving birth.  How often should I exercise? Unless your health care provider gives you different instructions, you should try to exercise on most days or all days of the week. In general, try to exercise with moderate intensity for about 150 minutes per week. This can be spread out across several days, such as exercising for 30 minutes per day on 5 days of each week. You can tell that you are exercising at a moderate intensity if you have a higher heart rate and faster breathing, but you are still able to hold a conversation. What types of moderate-intensity exercise are recommended during pregnancy? There are many types of exercise that are safe for you to do during pregnancy. Unless your health care provider gives you different instructions, do a variety of exercises that safely increase your heart and breathing (cardiopulmonary) rates and help you to build and maintain muscle strength (strength training). You should always be able to talk in full sentences while exercising during pregnancy. Some examples of exercising that is safe to do during pregnancy include:  Brisk walking or hiking.  Swimming.  Water aerobics.  Riding a stationary bike.  Strength training.  Modified yoga or Pilates. Tell your instructor that you are pregnant. Avoid overstretching and avoid lying on your back for long periods of time.  Running or jogging. Only choose this type of exercise if: ? You ran or jogged regularly before your pregnancy. ? You can run or jog and still talk in complete sentences.  What types of exercise should I not do during pregnancy? Depending on your level of fitness and whether you exercised regularly before your pregnancy, you may be advised to limit vigorous-intensity exercise during your pregnancy. You can tell that you are exercising at a vigorous intensity if you are breathing much harder and faster  and cannot hold a conversation while exercising. Some examples of exercising that you should avoid during pregnancy include:  Contact sports.  Activities that place you at risk for falling on or being hit in the belly, such as downhill skiing, water skiing, surfing, rock climbing, cycling, gymnastics, and horseback riding.  Scuba diving.  Sky diving.  Yoga or Pilates in a room that is heated to extreme temperatures ("hot yoga" or "hot Pilates").  Jogging or running, unless you ran or jogged regularly before your pregnancy. While jogging or running, you should always be able to talk in full sentences. Do not run or jog so vigorously that you are unable to have a conversation.  If you are not used to exercising at   elevation (more than 6,000 feet above sea level), do not do so during your pregnancy.  When should I avoid exercising during pregnancy? Certain medical conditions can make it unsafe to exercise during pregnancy, or they may increase your risk of miscarriage or early labor and birth. Some of these conditions include:  Some types of heart disease.  Some types of lung disease.  Placenta previa. This is when the placenta partially or completely covers the opening of the uterus (cervix).  Frequent bleeding from the vagina during your pregnancy.  Incompetent cervix. This is when your cervix does not remain as tightly closed during pregnancy as it should.  Premature labor.  Ruptured membranes. This is when the protective sac (amniotic sac) opens up and amniotic fluid leaks from your vagina.  Severely low blood count (anemia).  Preeclampsia or pregnancy-caused high blood pressure.  Carrying more than one baby (multiple gestation) and having an additional risk of early labor.  Poorly controlled diabetes.  Being severely underweight or severely overweight.  Intrauterine growth restriction. This is when your baby's growth and development during pregnancy are slower than  expected.  Other medical conditions. Ask your health care provider if any apply to you.  What else should I know about exercising during pregnancy? You should take these precautions while exercising during pregnancy:  Avoid overheating. ? Wear loose-fitting, breathable clothes. ? Do not exercise in very high temperatures.  Avoid dehydration. Drink enough water before, during, and after exercise to keep your urine clear or pale yellow.  Avoid overstretching. Because of hormone changes during pregnancy, it is easy to overstretch muscles, tendons, and ligaments during pregnancy.  Start slowly and ask your health care provider to recommend types of exercise that are safe for you, if exercising regularly is new for you.  Pregnancy is not a time for exercising to lose weight. When should I seek medical care? You should stop exercising and call your health care provider if you have any unusual symptoms, such as:  Mild uterine contractions or abdominal cramping.  Dizziness that does not improve with rest.  When should I seek immediate medical care? You should stop exercising and call your local emergency services (911 in the [place] if you have any unusual symptoms, such as:  Sudden, severe pain in your low back or your belly.  Uterine contractions or abdominal cramping that do not improve with rest.  Chest pain.  Bleeding or fluid leaking from your vagina.  Shortness of breath.  This information is not intended to replace advice given to you by your health care provider. Make sure you discuss any questions you have with your health care provider. Document Released: 10/16/2005 Document Revised: 03/15/2016 Document Reviewed: 12/24/2014 Else[employer] Interactive Patient Education  2018 [employer]  

## 2018-10-17 LAB — CYTOLOGY - PAP
Chlamydia: NEGATIVE
HPV 16/18/45 genotyping: POSITIVE — AB
HPV: DETECTED — AB
Neisseria Gonorrhea: NEGATIVE
TRICH (WINDOWPATH): POSITIVE — AB

## 2018-10-20 ENCOUNTER — Other Ambulatory Visit: Payer: Self-pay | Admitting: Advanced Practice Midwife

## 2018-10-20 DIAGNOSIS — A599 Trichomoniasis, unspecified: Secondary | ICD-10-CM

## 2018-10-20 MED ORDER — METRONIDAZOLE 500 MG PO TABS: 500.0000 mg | ORAL_TABLET | Freq: Two times a day (BID) | ORAL | 0 refills | Status: AC

## 2018-10-20 NOTE — Progress Notes (Signed)
Rx metronidazole sent to patient pharmacy for treatment of trichomoniasis.

## 2018-11-09 ENCOUNTER — Emergency Department: Payer: Managed Care, Other (non HMO)

## 2018-11-09 ENCOUNTER — Other Ambulatory Visit: Payer: Self-pay

## 2018-11-09 ENCOUNTER — Emergency Department
Admission: EM | Admit: 2018-11-09 | Discharge: 2018-11-09 | Disposition: A | Discharge status: Home / Self Care | Payer: Managed Care, Other (non HMO) | Attending: Emergency Medicine | Admitting: Emergency Medicine

## 2018-11-09 ENCOUNTER — Encounter: Payer: Self-pay | Admitting: Emergency Medicine

## 2018-11-09 DIAGNOSIS — Z79899 Other long term (current) drug therapy: Secondary | ICD-10-CM | POA: Insufficient documentation

## 2018-11-09 DIAGNOSIS — F17210 Nicotine dependence, cigarettes, uncomplicated: Secondary | ICD-10-CM | POA: Diagnosis not present

## 2018-11-09 DIAGNOSIS — L02512 Cutaneous abscess of left hand: Secondary | ICD-10-CM | POA: Diagnosis not present

## 2018-11-09 DIAGNOSIS — M79642 Pain in left hand: Secondary | ICD-10-CM | POA: Diagnosis present

## 2018-11-09 MED ORDER — CLINDAMYCIN PHOSPHATE 600 MG/4ML IJ SOLN
600.0000 mg | Freq: Once | INTRAMUSCULAR | Status: AC
  Administered 2018-11-09: 600 mg via INTRAMUSCULAR
  Filled 2018-11-09: qty 4

## 2018-11-09 MED ORDER — CLINDAMYCIN HCL 300 MG PO CAPS: 300.0000 mg | ORAL_CAPSULE | Freq: Four times a day (QID) | ORAL | 0 refills | Status: AC

## 2018-11-09 MED ORDER — HYDROCODONE-ACETAMINOPHEN 5-325 MG PO TABS
1.0000 | ORAL_TABLET | ORAL | Status: AC
  Administered 2018-11-09: 1 via ORAL
  Filled 2018-11-09: qty 1

## 2018-11-09 MED ORDER — HYDROCODONE-ACETAMINOPHEN 5-325 MG PO TABS: 1.0000 | ORAL_TABLET | Freq: Four times a day (QID) | ORAL | 0 refills | Status: DC | PRN

## 2018-11-09 NOTE — ED Triage Notes (Signed)
Index finger pain and swelling. States one week ago she thought she got something in it but nothing has come out. Increased swelling since.

## 2018-11-09 NOTE — ED Provider Notes (Signed)
[facility] EMERGENCY DEPARTMENT Provider Note   CSN: [id number] Arrival date & time: 11/09/18  1546     History   Chief Complaint Chief Complaint  Patient presents with  . Hand Pain    HPI Melissa Coffey is a 23 y.o. female.  Presents the emergency department for evaluation of left index finger swelling and pain.  She states for 1 week she has had some pain and swelling.  It started out as a small bump but 3 to 4 days ago she stuck a needle into the bump and has had increasing pain and swelling with a little bit of drainage.  Pain is mostly along the pulp space of the left index finger.  There is no significant swelling or redness along the dorsal aspect of the finger.  She denies biting her nails.  No recent infections.  No fevers.  HPI  Past Medical History:  Diagnosis Date  . Anemia   . Depression   . Endometriosis   . UTI (urinary tract infection)     Patient Active Problem List   Diagnosis Date Noted  . Sepsis (HCC) 06/22/2018  . Compulsive skin picking 11/30/2017  . Anxiety and depression 11/29/2017  . Previous cesarean delivery, antepartum condition or complication 05/09/2016  . Status post cesarean delivery 05/09/2016  . Anemia in pregnancy 11/11/2015    Past Surgical History:  Procedure Laterality Date  . ABDOMINAL SURGERY    . CESAREAN SECTION  02/2014   pLTCS. FITL at term  . CESAREAN SECTION N/A 05/09/2016   Procedure: CESAREAN SECTION;  Surgeon: Conard NovakStephen D Jackson, MD;  Location: [facility] ORS;  Service: Obstetrics;  Laterality: N/A;  . NECK SURGERY  2003  . TONSILLECTOMY       OB History    Gravida  3   Para  2   Term  1   Preterm  1   AB      Living  2     SAB      TAB      Ectopic      Multiple      Live Births           Obstetric Comments  02/2014: pLTCS for FITL         Home Medications    Prior to Admission medications   Medication Sig Start Date End Date Taking? Authorizing Provider  clindamycin  (CLEOCIN) 300 MG capsule Take 1 capsule (300 mg total) by mouth 4 (four) times daily for 7 days. 11/09/18 11/16/18  Evon SlackGaines, Aracelia Brinson C, PA-C  ferrous sulfate 325 (65 FE) MG tablet Take 325 mg by mouth 2 (two) times daily with a meal.     [provider]  HYDROcodone-acetaminophen (NORCO) 5-325 MG tablet Take 1 tablet by mouth every 6 (six) hours as needed for moderate pain. 11/09/18   Evon SlackGaines, Millena Callins C, PA-C    Family History Family History  Problem Relation Age of Onset  . Diabetes Maternal Grandmother   . Hypertension Maternal Grandmother   . Cancer Maternal Grandmother   . Breast cancer Maternal Grandmother   . Diabetes Maternal Uncle   . Healthy Mother   . Healthy Brother   . Healthy Daughter   . Healthy Son   . Healthy Brother   . Stroke Neg Hx   . Heart attack Neg Hx   . Colon cancer Neg Hx   . Ovarian cancer Neg Hx     Social History Social History   Tobacco  Use  . Smoking status: Current Every Day Smoker    Packs/day: 0.50    Years: 0.50    Pack years: 0.25    Types: Cigarettes  . Smokeless tobacco: Never Used  Substance Use Topics  . Alcohol use: No  . Drug use: Yes     Allergies   Patient has no known allergies.   Review of Systems Review of Systems  Constitutional: Negative for fever.  Musculoskeletal: Positive for arthralgias and joint swelling. Negative for myalgias, neck pain and neck stiffness.  Skin: Positive for wound.  Neurological: Negative for numbness.     Physical Exam Updated Vital Signs BP (!) 139/96   Pulse (!) 102   Temp 97.8 F (36.6 C) (Oral)   Resp 20   Ht 5\' 3"  (1.6 m)   Wt 63.5 kg   LMP 08/05/2018   SpO2 100%   Breastfeeding Unknown   BMI 24.80 kg/m   Physical Exam Constitutional:      Appearance: She is well-developed.  HENT:     Head: Normocephalic and atraumatic.  Eyes:     Conjunctiva/sclera: Conjunctivae normal.  Neck:     Musculoskeletal: Normal range of motion.  Cardiovascular:     Rate and Rhythm:  Normal rate.  Pulmonary:     Effort: Pulmonary effort is normal. No respiratory distress.  Musculoskeletal: Normal range of motion.     Comments: Left index finger shows very little erythema to the tip of the left index finger with a superficial abscess.  Abscess can be visualized through the dead skin along the pulp space.  Abscess fluid appears to be 1 cm in diameter.  There appears to be no paronychial infection or no infection under the nail.  She has good flexion of the index finger.  Skin:    General: Skin is warm.     Findings: No rash.  Neurological:     Mental Status: She is alert and oriented to person, place, and time.  Psychiatric:        Behavior: Behavior normal.        Thought Content: Thought content normal.      ED Treatments / Results  Labs (all labs ordered are listed, but only abnormal results are displayed) Labs Reviewed  AEROBIC/ANAEROBIC CULTURE (SURGICAL/DEEP WOUND)    EKG None  Radiology Dg Finger Index Left  Result Date: 11/09/2018 CLINICAL DATA:  Left index finger pain and swelling for 1 week. Possible foreign body. EXAM: LEFT INDEX FINGER 2+V COMPARISON:  10/28/2014 FINDINGS: There is no evidence of fracture or dislocation. There is no evidence of arthropathy, osteolysis, or other focal bone abnormality. Diffuse soft tissue swelling is seen, however there is no evidence of radiopaque foreign body. IMPRESSION: Diffuse soft tissue swelling. No evidence of radiopaque foreign body or osseous abnormality. Electronically Signed   By: Myles Rosenthal M.D.   On: 11/09/2018 16:45    Procedures .Marland KitchenIncision and Drainage Date/Time: 11/09/2018 6:16 PM Performed by: Evon Slack, PA-C Authorized by: Evon Slack, PA-C   Consent:    Consent obtained:  Verbal   Consent given by:  Patient   Risks discussed:  Incomplete drainage and infection   Alternatives discussed:  No treatment and delayed treatment Location:    Type:  Abscess   Size:  1 cm   Location:   Upper extremity   Upper extremity location:  Finger   Finger location:  L index finger Pre-procedure details:    Skin preparation:  Antiseptic wash and Betadine  Anesthesia (see MAR for exact dosages):    Anesthesia method:  Nerve block   Block location:  Left index finger digital block   Block needle gauge:  25 G   Block anesthetic:  Lidocaine 1% w/o epi   Block injection procedure:  Anatomic landmarks identified   Block outcome:  Anesthesia achieved Procedure type:    Complexity:  Simple Procedure details:    Needle aspiration: no     Incision types:  Stab incision   Incision depth:  Dermal   Scalpel blade:  11   Wound management:  Probed and deloculated   Drainage:  Purulent   Drainage amount:  Moderate   Wound treatment:  Wound left open Post-procedure details:    Patient tolerance of procedure:  Tolerated well, no immediate complications   (including critical care time)  Medications Ordered in ED Medications  HYDROcodone-acetaminophen (NORCO/VICODIN) 5-325 MG per tablet 1 tablet (has no administration in time range)  clindamycin (CLEOCIN) injection 600 mg (has no administration in time range)     Initial Impression / Assessment and Plan / ED Course  I have reviewed the triage vital signs and the nursing notes.  Pertinent labs & imaging results that were available during my care of the patient were reviewed by me and considered in my medical decision making (see chart for details).     23 year old female with left index finger superficial infection along the volar aspect of the distal phalanx.  Small incision made and purulent amounts of drainage removed and expressed.  Swelling reduced.  Patient placed on clindamycin and given Norco.  She will follow-up with orthopedist or hand surgeon.  She understands signs symptoms return to the ED for.  Final Clinical Impressions(s) / ED Diagnoses   Final diagnoses:  Finger pulp abscess, left    ED Discharge Orders          Ordered    clindamycin (CLEOCIN) 300 MG capsule  4 times daily     11/09/18 1812    HYDROcodone-acetaminophen (NORCO) 5-325 MG tablet  Every 6 hours PRN     11/09/18 1812           Evon Slack, PA-C 11/09/18 1818    Sharman Cheek, MD 11/10/18 2359

## 2018-11-09 NOTE — ED Notes (Signed)
Pt to ed with c/o left hand, second digit pain and swelling x 1 week.  Pt states there was a small "bump" to fingertip and since then has had increased swelling and pain.  Pt reports pain in other fingers and parts of the hand.

## 2018-11-09 NOTE — Discharge Instructions (Addendum)
Please soak finger in half hydrogen peroxide and half tap water 3 times daily.  Take antibiotics as prescribed.  Follow-up with orthopedist in 2 to 3 days for recheck.  Return to the ER for any increasing pain swelling fevers.

## 2018-11-14 LAB — AEROBIC/ANAEROBIC CULTURE W GRAM STAIN (SURGICAL/DEEP WOUND)

## 2019-01-01 ENCOUNTER — Other Ambulatory Visit: Payer: Self-pay

## 2019-01-01 ENCOUNTER — Encounter: Payer: Self-pay | Admitting: Advanced Practice Midwife

## 2019-01-01 ENCOUNTER — Ambulatory Visit (INDEPENDENT_AMBULATORY_CARE_PROVIDER_SITE_OTHER): Payer: Managed Care, Other (non HMO) | Admitting: Advanced Practice Midwife

## 2019-01-01 VITALS — BP 104/64 | HR 102 | Ht 64.0 in | Wt 157.0 lb

## 2019-01-01 DIAGNOSIS — R110 Nausea: Secondary | ICD-10-CM

## 2019-01-01 DIAGNOSIS — O26899 Other specified pregnancy related conditions, unspecified trimester: Secondary | ICD-10-CM

## 2019-01-01 DIAGNOSIS — O26891 Other specified pregnancy related conditions, first trimester: Secondary | ICD-10-CM | POA: Diagnosis not present

## 2019-01-01 NOTE — Patient Instructions (Signed)

## 2019-01-01 NOTE — Progress Notes (Signed)
S: 23 yo G4 P1112 who is experiencing all day nausea for the past 2 weeks. She is scheduled for NOB in 2 days. She denies any vomiting. She has been taking tylenol for headaches. She denies having nausea/vomiting with her previous pregnancies and is wondering what she can do to decrease symptoms. She had a positive home pregnancy test about 2 weeks ago. She has not had a clinic confirmation pregnancy test. She has no other concerns or questions today.  Review of Systems  Constitutional: Positive for malaise/fatigue.  HENT: Negative.   Eyes: Negative.   Respiratory: Positive for shortness of breath.   Cardiovascular: Negative.   Gastrointestinal: Positive for nausea.  Genitourinary: Positive for frequency.  Musculoskeletal: Negative.   Skin: Negative.   Neurological: Positive for headaches.  Endo/Heme/Allergies: Negative.   Psychiatric/Behavioral: Negative.   Breast: +tenderness   O: Vital Signs: BP 104/64 (BP Location: Left Arm, Patient Position: Sitting, Cuff Size: Normal)   Pulse (!) 102   Ht 5\' 4"  (1.626 m)   Wt 157 lb (71.2 kg)   LMP 11/19/2018 (Exact Date)   BMI 26.95 kg/m  Constitutional: Well nourished, well developed female in no acute distress.  HEENT: normal Skin: Warm and dry.   Respiratory:  Normal respiratory effort Psych: Alert and Oriented x3. No memory deficits. Normal mood and affect.   A: 23 yo G4 P74 female with positive home pregnancy test, all day nausea   P: Comfort measures including high protein snack at bedtime, eat something 1st thing in the morning, eat small frequent meals throughout the day, eat what sounds ok, stay hydrated, ginger, sour lemon, sea bands, OTC unisom/B6.  Let us know if nausea worsens or vomiting begins or if she needs different medication  Return to clinic on Friday for NOB   Tresea Mall, CNM

## 2019-01-03 ENCOUNTER — Other Ambulatory Visit (HOSPITAL_COMMUNITY)
Admission: RE | Admit: 2019-01-03 | Discharge: 2019-01-03 | Disposition: A | Discharge status: Home / Self Care | Payer: Managed Care, Other (non HMO) | Source: Ambulatory Visit | Attending: Advanced Practice Midwife | Admitting: Advanced Practice Midwife

## 2019-01-03 ENCOUNTER — Ambulatory Visit (INDEPENDENT_AMBULATORY_CARE_PROVIDER_SITE_OTHER): Payer: Managed Care, Other (non HMO) | Admitting: Advanced Practice Midwife

## 2019-01-03 ENCOUNTER — Encounter: Payer: Self-pay | Admitting: Advanced Practice Midwife

## 2019-01-03 VITALS — BP 100/60 | HR 98 | Wt 161.0 lb

## 2019-01-03 DIAGNOSIS — Z3480 Encounter for supervision of other normal pregnancy, unspecified trimester: Secondary | ICD-10-CM | POA: Diagnosis present

## 2019-01-03 DIAGNOSIS — Z113 Encounter for screening for infections with a predominantly sexual mode of transmission: Secondary | ICD-10-CM

## 2019-01-03 DIAGNOSIS — Z3201 Encounter for pregnancy test, result positive: Secondary | ICD-10-CM

## 2019-01-03 DIAGNOSIS — Z3A01 Less than 8 weeks gestation of pregnancy: Secondary | ICD-10-CM

## 2019-01-03 DIAGNOSIS — N912 Amenorrhea, unspecified: Secondary | ICD-10-CM

## 2019-01-03 DIAGNOSIS — O26891 Other specified pregnancy related conditions, first trimester: Secondary | ICD-10-CM | POA: Diagnosis not present

## 2019-01-03 DIAGNOSIS — O26899 Other specified pregnancy related conditions, unspecified trimester: Secondary | ICD-10-CM

## 2019-01-03 DIAGNOSIS — R110 Nausea: Secondary | ICD-10-CM

## 2019-01-03 LAB — POCT URINALYSIS DIPSTICK OB
Appearance: ABNORMAL
Bilirubin, UA: NEGATIVE
Blood, UA: NEGATIVE
Glucose, UA: NEGATIVE
Ketones, UA: NEGATIVE
Nitrite, UA: NEGATIVE
ODOR: ABNORMAL
POC,PROTEIN,UA: NEGATIVE
Spec Grav, UA: 1.01 (ref 1.010–1.025)
Urobilinogen, UA: 0.2 E.U./dL
pH, UA: 6 (ref 5.0–8.0)

## 2019-01-03 LAB — OB RESULTS CONSOLE VARICELLA ZOSTER ANTIBODY, IGG: Varicella: IMMUNE

## 2019-01-03 LAB — POCT URINE PREGNANCY: Preg Test, Ur: POSITIVE — AB

## 2019-01-03 MED ORDER — ONDANSETRON 4 MG PO TBDP: 4.0000 mg | ORAL_TABLET | Freq: Four times a day (QID) | ORAL | 2 refills | Status: DC | PRN

## 2019-01-03 NOTE — Progress Notes (Signed)
New Obstetric Patient H&P    Chief Complaint: "Desires prenatal care"   History of Present Illness: Patient is a 23 y.o. [id number] Not Hispanic or Latino female, presents with amenorrhea and positive home pregnancy test. Patient's last menstrual period was 11/19/2018 (exact date). and based on her  LMP, her EDD is Estimated Date of Delivery: 08/26/19 and her EGA is [redacted]w[redacted]d. Cycles are 4-5. days, regular, and occur approximately every : 28 days. 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 She will need a Postpartum PAP smear.   She had a urine pregnancy test which was positive 2 week(s)  ago. Her last menstrual period was normal and lasted for  4 or 5 day(s). Since her LMP she claims she has experienced breast tenderness, fatigue, nausea, vomiting. She denies vaginal bleeding. Her past medical history is contributory for endometriosis surgery followed by first successful pregnancy. Her prior pregnancies are notable for C/section 2015 FITL, C/section 2017 placental abruption, SAB 2019  Since her LMP, she admits to the use of tobacco products  no She claims she has gained   6 pounds since the start of her pregnancy.  There are cats in the home in the home  no  She admits close contact with children on a regular basis  yes  She has had chicken pox in the past unknown She has had Tuberculosis exposures, symptoms, or previously tested positive for TB   no Current or past history of domestic violence. no  Genetic Screening/Teratology Counseling: (Includes patient, baby's father, or anyone in either family with:)   1. Patient's age >/= 62 at [facility]  no 2. Thalassemia ([place], [place], Mediterranean, or Asian background): MCV<80  no 3. Neural tube defect (meningomyelocele, spina bifida, anencephaly)  no 4. Congenital heart defect  no  5. Down syndrome  no 6. Tay-Sachs (Jewish, [place]  no 7. Canavan's  Disease  no 8. Sickle cell disease or trait (African)  no  9. Hemophilia or other blood disorders  no  10. Muscular dystrophy  no  11. Cystic fibrosis  no  12. Huntington's Chorea  no  13. Mental retardation/autism  no 14. Other inherited genetic or chromosomal disorder  no 15. Maternal metabolic disorder (DM, PKU, etc)  no 16. Patient or FOB with a child with a birth defect not listed above no  16a. Patient or FOB with a birth defect themselves FOB born with 1 kidney 68. Recurrent pregnancy loss, or stillbirth  no  18. Any medications since LMP other than prenatal vitamins (include vitamins, supplements, OTC meds, drugs, alcohol)  no 19. Any other genetic/environmental exposure to discuss  no  Infection History:   1. Lives with someone with TB or TB exposed  no  2. Patient or partner has history of genital herpes  no 3. Rash or viral illness since LMP  no 4. History of STI (GC, CT, HPV, syphilis, HIV)  no 5. History of recent travel :  no  Other pertinent information:  no     Review of Systems:10 point review of systems negative unless otherwise noted in HPI  Past Medical History:  Past Medical History:  Diagnosis Date  . Anemia   . Depression   . Endometriosis   . UTI (urinary tract infection)     Past Surgical History:  Past Surgical History:  Procedure Laterality Date  . ABDOMINAL SURGERY    . CESAREAN SECTION  02/2014  pLTCS. FITL at term  . CESAREAN SECTION N/A 05/09/2016   Procedure: CESAREAN SECTION;  Surgeon: Conard Novak, MD;  Location: [facility] ORS;  Service: Obstetrics;  Laterality: N/A;  . NECK SURGERY  2003  . TONSILLECTOMY      Gynecologic History: Patient's last menstrual period was 11/19/2018 (exact date).  Obstetric History: [id number]  Family History:  Family History  Problem Relation Age of Onset  . Diabetes Maternal Grandmother   . Hypertension Maternal Grandmother   . Cancer Maternal Grandmother   . Breast cancer Maternal Grandmother   .  Diabetes Maternal Uncle   . Healthy Mother   . Healthy Brother   . Healthy Daughter   . Healthy Son   . Healthy Brother   . Stroke Neg Hx   . Heart attack Neg Hx   . Colon cancer Neg Hx   . Ovarian cancer Neg Hx     Social History:  Social History   Socioeconomic History  . Marital status: Single    Spouse name: Not on file  . Number of children: 2  . Years of education: Not on file  . Highest education level: High school graduate  Occupational History  . Not on file  Social Needs  . Financial resource strain: Not on file  . Food insecurity:    Worry: Not on file    Inability: Not on file  . Transportation needs:    Medical: Not on file    Non-medical: Not on file  Tobacco Use  . Smoking status: Former Smoker    Packs/day: 0.50    Years: 0.50    Pack years: 0.25    Types: Cigarettes    Last attempt to quit: 12/03/2018    Years since quitting: 0.0  . Smokeless tobacco: Never Used  . Tobacco comment: quit w/+pregnancy test  Substance and Sexual Activity  . Alcohol use: No  . Drug use: Not Currently  . Sexual activity: Yes    Partners: Male    Birth control/protection: None  Lifestyle  . Physical activity:    Days per week: Not on file    Minutes per session: Not on file  . Stress: Not on file  Relationships  . Social connections:    Talks on phone: Not on file    Gets together: Not on file    Attends religious service: Not on file    Active member of club or organization: Not on file    Attends meetings of clubs or organizations: Not on file    Relationship status: Not on file  . Intimate partner violence:    Fear of current or ex partner: Not on file    Emotionally abused: Not on file    Physically abused: Not on file    Forced sexual activity: Not on file  Other Topics Concern  . Not on file  Social History Narrative  . Not on file    Allergies:  No Known Allergies  Medications: Prior to Admission medications   Medication Sig Start Date End  Date Taking? Authorizing Provider  Prenatal Vit-Fe Fumarate-FA (MULTIVITAMIN-PRENATAL) 27-0.8 MG TABS tablet Take 1 tablet by mouth daily at 12 noon.   Yes [provider]  ferrous sulfate 325 (65 FE) MG tablet Take 325 mg by mouth 2 (two) times daily with a meal.     [provider]  ondansetron (ZOFRAN ODT) 4 MG disintegrating tablet Take 1 tablet (4 mg total) by mouth every 6 (six) hours as needed  for nausea. 01/03/19   Tresea Mall, CNM    Physical Exam Vitals: Blood pressure 100/60, pulse 98, weight 161 lb (73 kg), last menstrual period 11/19/2018, unknown if currently breastfeeding.  General: NAD HEENT: normocephalic, anicteric Thyroid: no enlargement, no palpable nodules Pulmonary: No increased work of breathing, CTAB Cardiovascular: RRR, distal pulses 2+ Abdomen: NABS, soft, non-tender, non-distended.  Umbilicus without lesions.  No hepatomegaly, splenomegaly or masses palpable. No evidence of hernia  Genitourinary: deferred for no concerns/PAP interval Extremities: no edema, erythema, or tenderness Neurologic: Grossly intact Psychiatric: mood appropriate, affect full   Assessment: 23 y.o. [id number] at [redacted]w[redacted]d presenting to initiate prenatal care  Plan: 1) Avoid alcoholic beverages. 2) Patient encouraged not to smoke.  3) Discontinue the use of all non-medicinal drugs and chemicals.  4) Take prenatal vitamins daily.  5) Nutrition, food safety (fish, cheese advisories, and high nitrite foods) and exercise discussed. 6) Hospital and practice style discussed with cross coverage system.  7) Genetic Screening, such as with 1st Trimester Screening, cell free fetal DNA, AFP testing, and Ultrasound, as well as with amniocentesis and CVS as appropriate, is discussed with patient. At the conclusion of today's visit patient declined genetic testing 8) Patient is asked about travel to areas at risk for the [place] virus, and counseled to avoid travel and exposure to mosquitoes or  sexual partners who may have themselves been exposed to the virus. Testing is discussed, and will be ordered as appropriate.  9) All prenatal labs done today 10) RTC in 1 week for dating scan and ROB   Tresea Mall, CNM [facility], [facility] 01/03/2019, 5:04 PM

## 2019-01-03 NOTE — Patient Instructions (Signed)
Exercise During Pregnancy For people of all ages, exercise is an important part of being healthy. Exercise improves heart and lung function and helps to maintain strength, flexibility, and a healthy body weight. Exercise also boosts energy levels and elevates mood. For most women, maintaining an exercise routine throughout pregnancy is recommended. It is only on rare occasions and with certain medical conditions or pregnancy complications that women may be asked to limit or avoid exercise during pregnancy. What are some other benefits to exercising during pregnancy? Along with maintaining strength and flexibility, exercising throughout pregnancy can help to:  Keep strength in muscles that are very important during labor and childbirth.  Decrease low back pain during pregnancy.  Decrease the risk of developing gestational diabetes mellitus (GDM).  Improve blood sugar (glucose) control for women who have GDM.  Decrease the risk of developing preeclampsia. This is a serious condition that causes high blood pressure along with other symptoms, such as swelling and headaches.  Decrease the risk of cesarean delivery.  Speed up the recovery after giving birth. How often should I exercise? Unless your health care provider gives you different instructions, you should try to exercise on most days or all days of the week. In general, try to exercise with moderate intensity for about 150 minutes per week. This can be spread out across several days, such as exercising for 30 minutes per day on 5 days of each week. You can tell that you are exercising at a moderate intensity if you have a higher heart rate and faster breathing, but you are still able to hold a conversation. What types of moderate-intensity exercise are recommended during pregnancy? There are many types of exercise that are safe for you to do during pregnancy. Unless your health care provider gives you different instructions, do a variety of  exercises that safely increase your heart and breathing (cardiopulmonary) rates and help you to build and maintain muscle strength (strength training). You should always be able to talk in full sentences while exercising during pregnancy. Some examples of exercising that is safe to do during pregnancy include:  Brisk walking or hiking.  Swimming.  Water aerobics.  Riding a stationary bike.  Strength training.  Modified yoga or Pilates. Tell your instructor that you are pregnant. Avoid overstretching and avoid lying on your back for long periods of time.  Running or jogging. Only choose this type of exercise if: ? You ran or jogged regularly before your pregnancy. ? You can run or jog and still talk in complete sentences. What types of exercise should I not do during pregnancy? Depending on your level of fitness and whether you exercised regularly before your pregnancy, you may be advised to limit vigorous-intensity exercise during your pregnancy. You can tell that you are exercising at a vigorous intensity if you are breathing much harder and faster and cannot hold a conversation while exercising. Some examples of exercising that you should avoid during pregnancy include:  Contact sports.  Activities that place you at risk for falling on or being hit in the belly, such as downhill skiing, water skiing, surfing, rock climbing, cycling, gymnastics, and horseback riding.  Scuba diving.  Sky diving.  Yoga or Pilates in a room that is heated to extreme temperatures ("hot yoga" or "hot Pilates").  Jogging or running, unless you ran or jogged regularly before your pregnancy. While jogging or running, you should always be able to talk in full sentences. Do not run or jog so vigorously that you   are unable to have a conversation.  If you are not used to exercising at elevation (more than 6,000 feet above sea level), do not do so during your pregnancy. When should I avoid exercising during  pregnancy? Certain medical conditions can make it unsafe to exercise during pregnancy, or they may increase your risk of miscarriage or early labor and birth. Some of these conditions include:  Some types of heart disease.  Some types of lung disease.  Placenta previa. This is when the placenta partially or completely covers the opening of the uterus (cervix).  Frequent bleeding from the vagina during your pregnancy.  Incompetent cervix. This is when your cervix does not remain as tightly closed during pregnancy as it should.  Premature labor.  Ruptured membranes. This is when the protective sac (amniotic sac) opens up and amniotic fluid leaks from your vagina.  Severely low blood count (anemia).  Preeclampsia or pregnancy-caused high blood pressure.  Carrying more than one baby (multiple gestation) and having an additional risk of early labor.  Poorly controlled diabetes.  Being severely underweight or severely overweight.  Intrauterine growth restriction. This is when your baby's growth and development during pregnancy are slower than expected.  Other medical conditions. Ask your health care provider if any apply to you. What else should I know about exercising during pregnancy? You should take these precautions while exercising during pregnancy:  Avoid overheating. ? Wear loose-fitting, breathable clothes. ? Do not exercise in very high temperatures.  Avoid dehydration. Drink enough water before, during, and after exercise to keep your urine clear or pale yellow.  Avoid overstretching. Because of hormone changes during pregnancy, it is easy to overstretch muscles, tendons, and ligaments during pregnancy.  Start slowly and ask your health care provider to recommend types of exercise that are safe for you, if exercising regularly is new for you. Pregnancy is not a time for exercising to lose weight. When should I seek medical care? You should stop exercising and call your  health care provider if you have any unusual symptoms, such as:  Mild uterine contractions or abdominal cramping.  Dizziness that does not improve with rest. When should I seek immediate medical care? You should stop exercising and call your local emergency services (911 in the [place] if you have any unusual symptoms, such as:  Sudden, severe pain in your low back or your belly.  Uterine contractions or abdominal cramping that do not improve with rest.  Chest pain.  Bleeding or fluid leaking from your vagina.  Shortness of breath. This information is not intended to replace advice given to you by your health care provider. Make sure you discuss any questions you have with your health care provider. Document Released: 10/16/2005 Document Revised: 03/15/2016 Document Reviewed: 12/24/2014 Elsevier Interactive Patient Education  2019 [employer] Eating Plan for Pregnant Women While you are pregnant, your body requires additional nutrition to help support your growing baby. You also have a higher need for some vitamins and minerals, such as folic acid, calcium, iron, and vitamin D. Eating a healthy, well-balanced diet is very important for your health and your baby's health. Your need for extra calories varies for the three 3-month segments of your pregnancy (trimesters). For most women, it is recommended to consume:  150 extra calories a day during the first trimester.  300 extra calories a day during the second trimester.  300 extra calories a day during the third trimester. What are tips for following this plan?   Do   not try to lose weight or go on a diet during pregnancy.  Limit your overall intake of foods that have "empty calories." These are foods that have little nutritional value, such as sweets, desserts, candies, and sugar-sweetened beverages.  Eat a variety of foods (especially fruits and vegetables) to get a full range of vitamins and minerals.  Take a prenatal vitamin  to help meet your additional vitamin and mineral needs during pregnancy, specifically for folic acid, iron, calcium, and vitamin D.  Remember to stay active. Ask your health care provider what types of exercise and activities are safe for you.  Practice good food safety and cleanliness. Wash your hands before you eat and after you prepare raw meat. Wash all fruits and vegetables well before peeling or eating. Taking these actions can help to prevent food-borne illnesses that can be very dangerous to your baby, such as listeriosis. Ask your health care provider for more information about listeriosis. What does 150 extra calories look like? Healthy options that provide 150 extra calories each day could be any of the following:  6-8 oz (170-230 g) of plain low-fat yogurt with  cup of berries.  1 apple with 2 teaspoons (11 g) of peanut butter.  Cut-up vegetables with  cup (60 g) of hummus.  8 oz (230 mL) or 1 cup of low-fat chocolate milk.  1 stick of string cheese with 1 medium orange.  1 peanut butter and jelly sandwich that is made with one slice of whole-wheat bread and 1 tsp (5 g) of peanut butter. For 300 extra calories, you could eat two of those healthy options each day. What is a healthy amount of weight to gain? The right amount of weight gain for you is based on your BMI before you became pregnant. If your BMI:  Was less than 18 (underweight), you should gain 28-40 lb (13-18 kg).  Was 18-24.9 (normal), you should gain 25-35 lb (11-16 kg).  Was 25-29.9 (overweight), you should gain 15-25 lb (7-11 kg).  Was 30 or greater (obese), you should gain 11-20 lb (5-9 kg). What if I am having twins or multiples? Generally, if you are carrying twins or multiples:  You may need to eat 300-600 extra calories a day.  The recommended range for total weight gain is 25-54 lb (11-25 kg), depending on your BMI before pregnancy.  Talk with your health care provider to find out about  nutritional needs, weight gain, and exercise that is right for you. What foods can I eat?  Grains All grains. Choose whole grains, such as whole-wheat bread, oatmeal, or brown rice. Vegetables All vegetables. Eat a variety of colors and types of vegetables. Remember to wash your vegetables well before peeling or eating. Fruits All fruits. Eat a variety of colors and types of fruit. Remember to wash your fruits well before peeling or eating. Meats and other protein foods Lean meats, including chicken, turkey, fish, and lean cuts of beef, veal, or pork. If you eat fish or seafood, choose options that are higher in omega-3 fatty acids and lower in mercury, such as salmon, herring, mussels, trout, sardines, pollock, shrimp, crab, and lobster. Tofu. Tempeh. Beans. Eggs. Peanut butter and other nut butters. Make sure that all meats, poultry, and eggs are cooked to food-safe temperatures or "well-done." Two or more servings of fish are recommended each week in order to get the most benefits from omega-3 fatty acids that are found in seafood. Choose fish that are lower in mercury. You can   find more information online:  www.fda.gov Dairy Pasteurized milk and milk alternatives (such as almond milk). Pasteurized yogurt and pasteurized cheese. Cottage cheese. Sour cream. Beverages Water. Juices that contain 100% fruit juice or vegetable juice. Caffeine-free teas and decaffeinated coffee. Drinks that contain caffeine are okay to drink, but it is better to avoid caffeine. Keep your total caffeine intake to less than 200 mg each day (which is 12 oz or 355 mL of coffee, tea, or soda) or the limit as told by your health care provider. Fats and oils Fats and oils are okay to include in moderation. Sweets and desserts Sweets and desserts are okay to include in moderation. Seasoning and other foods All pasteurized condiments. The items listed above may not be a complete list of recommended foods and beverages.  Contact your dietitian for more options. What foods are not recommended? Vegetables Raw (unpasteurized) vegetable juices. Fruits Unpasteurized fruit juices. Meats and other protein foods Lunch meats, bologna, hot dogs, or other deli meats. (If you must eat those meats, reheat them until they are steaming hot.) Refrigerated pat, meat spreads from a meat counter, smoked seafood that is found in the refrigerated section of a store. Raw or undercooked meats, poultry, and eggs. Raw fish, such as sushi or sashimi. Fish that have high mercury content, such as tilefish, shark, swordfish, and king mackerel. To learn more about mercury in fish, talk with your health care provider or look for online resources, such as:  www.fda.gov Dairy Raw (unpasteurized) milk and any foods that have raw milk in them. Soft cheeses, such as feta, queso blanco, queso fresco, Brie, Camembert cheeses, blue-veined cheeses, and Panela cheese (unless it is made with pasteurized milk, which must be stated on the label). Beverages Alcohol. Sugar-sweetened beverages, such as sodas, teas, or energy drinks. Seasoning and other foods Homemade fermented foods and drinks, such as pickles, sauerkraut, or kombucha drinks. (Store-bought pasteurized versions of these are okay.) Salads that are made in a store or deli, such as ham salad, chicken salad, egg salad, tuna salad, and seafood salad. The items listed above may not be a complete list of foods and beverages to avoid. Contact your dietitian for more information. Where to find more information To calculate the number of calories you need based on your height, weight, and activity level, you can use an online calculator such as:  www.choosemyplate.gov/MyPlatePlan To calculate how much weight you should gain during pregnancy, you can use an online pregnancy weight gain calculator such as:  www.choosemyplate.gov/pregnancy-weight-gain-calculator Summary  While you are pregnant,  your body requires additional nutrition to help support your growing baby.  Eat a variety of foods, especially fruits and vegetables to get a full range of vitamins and minerals.  Practice good food safety and cleanliness. Wash your hands before you eat and after you prepare raw meat. Wash all fruits and vegetables well before peeling or eating. Taking these actions can help to prevent food-borne illnesses, such as listeriosis, that can be very dangerous to your baby.  Do not eat raw meat or fish. Do not eat fish that have high mercury content, such as tilefish, shark, swordfish, and king mackerel. Do not eat unpasteurized (raw) dairy.  Take a prenatal vitamin to help meet your additional vitamin and mineral needs during pregnancy, specifically for folic acid, iron, calcium, and vitamin D. This information is not intended to replace advice given to you by your health care provider. Make sure you discuss any questions you have with your health care   provider. Document Released: 07/31/2014 Document Revised: 07/13/2017 Document Reviewed: 07/13/2017 Elsevier Interactive Patient Education  2019 [employer] Prenatal Care Prenatal care is health care during pregnancy. It helps you and your unborn baby (fetus) stay as healthy as possible. Prenatal care may be provided by a midwife, a family practice health care provider, or a childbirth and pregnancy specialist (obstetrician). How does this affect me? During pregnancy, you will be closely monitored for any new conditions that might develop. To lower your risk of pregnancy complications, you and your health care provider will talk about any underlying conditions you have. How does this affect my baby? Early and consistent prenatal care increases the chance that your baby will be healthy during pregnancy. Prenatal care lowers the risk that your baby will be:  Born early (prematurely).  Smaller than expected at birth (small for gestational age). What  can I expect at the first prenatal care visit? Your first prenatal care visit will likely be the longest. You should schedule your first prenatal care visit as soon as you know that you are pregnant. Your first visit is a good time to talk about any questions or concerns you have about pregnancy. At your visit, you and your health care provider will talk about:  Your medical history, including: ? Any past pregnancies. ? Your family's medical history. ? The baby's father's medical history. ? Any long-term (chronic) health conditions you have and how you manage them. ? Any surgeries or procedures you have had. ? Any current over-the-counter or prescription medicines, herbs, or supplements you are taking.  Other factors that could pose a risk to your baby, including:  Your home setting and your stress levels, including: ? Exposure to abuse or violence. ? Household financial strain. ? Mental health conditions you have.  Your daily health habits, including diet and exercise. Your health care provider will also:  Measure your weight, height, and blood pressure.  Do a physical exam, including a pelvic and breast exam.  Perform blood tests and urine tests to check for: ? Urinary tract infection. ? Sexually transmitted infections (STIs). ? Low iron levels in your blood (anemia). ? Blood type and certain proteins on red blood cells (Rh antibodies). ? Infections and immunity to viruses, such as hepatitis B and rubella. ? HIV (human immunodeficiency virus).  Do an ultrasound to confirm your baby's growth and development and to help predict your estimated due date (EDD). This ultrasound is done with a probe that is inserted into the vagina (transvaginal ultrasound).  Discuss your options for genetic screening.  Give you information about how to keep yourself and your baby healthy, including: ? Nutrition and taking vitamins. ? Physical activity. ? How to manage pregnancy symptoms such as  nausea and vomiting (morning sickness). ? Infections and substances that may be harmful to your baby and how to avoid them. ? Food safety. ? Dental care. ? Working. ? Travel. ? Warning signs to watch for and when to call your health care provider. How often will I have prenatal care visits? After your first prenatal care visit, you will have regular visits throughout your pregnancy. The visit schedule is often as follows:  Up to week 28 of pregnancy: once every 4 weeks.  28-36 weeks: once every 2 weeks.  After 36 weeks: every week until delivery. Some women may have visits more or less often depending on any underlying health conditions and the health of the baby. Keep all follow-up and prenatal care visits as told by   your health care provider. This is important. What happens during routine prenatal care visits? Your health care provider will:  Measure your weight and blood pressure.  Check for fetal heart sounds.  Measure the height of your uterus in your abdomen (fundal height). This may be measured starting around week 20 of pregnancy.  Check the position of your baby inside your uterus.  Ask questions about your diet, sleeping patterns, and whether you can feel the baby move.  Review warning signs to watch for and signs of labor.  Ask about any pregnancy symptoms you are having and how you are dealing with them. Symptoms may include: ? Headaches. ? Nausea and vomiting. ? Vaginal discharge. ? Swelling. ? Fatigue. ? Constipation. ? Any discomfort, including back or pelvic pain. Make a list of questions to ask your health care provider at your routine visits. What tests might I have during prenatal care visits? You may have blood, urine, and imaging tests throughout your pregnancy, such as:  Urine tests to check for glucose, protein, or signs of infection.  Glucose tests to check for a form of diabetes that can develop during pregnancy (gestational diabetes mellitus).  This is usually done around week 24 of pregnancy.  An ultrasound to check your baby's growth and development and to check for birth defects. This is usually done around week 20 of pregnancy.  A test to check for group B strep (GBS) infection. This is usually done around week 36 of pregnancy.  Genetic testing. This may include blood or imaging tests, such as an ultrasound. Some genetic tests are done during the first trimester and some are done during the second trimester. What else can I expect during prenatal care visits? Your health care provider may recommend getting certain vaccines during pregnancy. These may include:  A yearly flu shot (annual influenza vaccine). This is especially important if you will be pregnant during flu season.  Tdap (tetanus, diphtheria, pertussis) vaccine. Getting this vaccine during pregnancy can protect your baby from whooping cough (pertussis) after birth. This vaccine may be recommended between weeks 27 and 36 of pregnancy. Later in your pregnancy, your health care provider may give you information about:  Childbirth and breastfeeding classes.  Choosing a health care provider for your baby.  Umbilical cord banking.  Breastfeeding.  Birth control after your baby is born.  The hospital labor and delivery unit and how to tour it.  Registering at the hospital before you go into labor. Where to find more information  Office on Women's Health: womenshealth.gov  American Pregnancy Association: americanpregnancy.org  March of Dimes: marchofdimes.org Summary  Prenatal care helps you and your baby stay as healthy as possible during pregnancy.  Your first prenatal care visit will most likely be the longest.  You will have visits and tests throughout your pregnancy to monitor your health and your baby's health.  Bring a list of questions to your visits to ask your health care provider.  Make sure to keep all follow-up and prenatal care visits with  your health care provider. This information is not intended to replace advice given to you by your health care provider. Make sure you discuss any questions you have with your health care provider. Document Released: 10/19/2003 Document Revised: 10/15/2017 Document Reviewed: 10/15/2017 Elsevier Interactive Patient Education  2019 [employer]  

## 2019-01-03 NOTE — Progress Notes (Signed)
+  home test ~12/14/2018

## 2019-01-04 LAB — RPR+RH+ABO+RUB AB+AB SCR+CB...
Antibody Screen: NEGATIVE
HIV SCREEN 4TH GENERATION: NONREACTIVE
Hematocrit: 36.8 % (ref 34.0–46.6)
Hemoglobin: 12.8 g/dL (ref 11.1–15.9)
Hepatitis B Surface Ag: NEGATIVE
MCH: 30.1 pg (ref 26.6–33.0)
MCHC: 34.8 g/dL (ref 31.5–35.7)
MCV: 87 fL (ref 79–97)
Platelets: 369 10*3/uL (ref 150–450)
RBC: 4.25 x10E6/uL (ref 3.77–5.28)
RDW: 14.1 % (ref 11.7–15.4)
RPR: NONREACTIVE
Rh Factor: POSITIVE
Rubella Antibodies, IGG: <0.9 index — ABNORMAL LOW (ref >0.99)
Varicella zoster IgG: 212 index (ref >165)
WBC: 12.2 10*3/uL — ABNORMAL HIGH (ref 3.4–10.8)

## 2019-01-04 LAB — URINE DRUG PANEL 7
Amphetamines, Urine: NEGATIVE ng/mL
Barbiturate Quant, Ur: NEGATIVE ng/mL
Benzodiazepine Quant, Ur: NEGATIVE ng/mL
Cannabinoid Quant, Ur: NEGATIVE ng/mL
Cocaine (Metab.): NEGATIVE ng/mL
OPIATE QUANT UR: NEGATIVE ng/mL
PCP Quant, Ur: NEGATIVE ng/mL

## 2019-01-06 LAB — URINE CULTURE

## 2019-01-07 LAB — CERVICOVAGINAL ANCILLARY ONLY
Chlamydia: NEGATIVE
NEISSERIA GONORRHEA: NEGATIVE
TRICH (WINDOWPATH): POSITIVE — AB

## 2019-01-08 ENCOUNTER — Other Ambulatory Visit: Payer: Self-pay | Admitting: Advanced Practice Midwife

## 2019-01-08 DIAGNOSIS — A5901 Trichomonal vulvovaginitis: Secondary | ICD-10-CM

## 2019-01-08 MED ORDER — METRONIDAZOLE 500 MG PO TABS: 500.0000 mg | ORAL_TABLET | Freq: Two times a day (BID) | ORAL | 0 refills | Status: AC

## 2019-01-08 NOTE — Progress Notes (Signed)
Rx metronidazole sent to patient pharmacy to treat trichomonas infection.

## 2019-01-09 ENCOUNTER — Ambulatory Visit (INDEPENDENT_AMBULATORY_CARE_PROVIDER_SITE_OTHER): Payer: Managed Care, Other (non HMO)

## 2019-01-09 ENCOUNTER — Other Ambulatory Visit: Payer: Self-pay

## 2019-01-09 ENCOUNTER — Encounter: Payer: Managed Care, Other (non HMO) | Admitting: Maternal Newborn

## 2019-01-09 DIAGNOSIS — O208 Other hemorrhage in early pregnancy: Secondary | ICD-10-CM | POA: Diagnosis not present

## 2019-01-09 DIAGNOSIS — Z3A01 Less than 8 weeks gestation of pregnancy: Secondary | ICD-10-CM | POA: Diagnosis not present

## 2019-01-09 DIAGNOSIS — N8311 Corpus luteum cyst of right ovary: Secondary | ICD-10-CM

## 2019-01-09 DIAGNOSIS — O3481 Maternal care for other abnormalities of pelvic organs, first trimester: Secondary | ICD-10-CM | POA: Diagnosis not present

## 2019-01-09 DIAGNOSIS — Z3480 Encounter for supervision of other normal pregnancy, unspecified trimester: Secondary | ICD-10-CM

## 2019-01-13 ENCOUNTER — Other Ambulatory Visit: Payer: Self-pay

## 2019-01-13 ENCOUNTER — Encounter: Payer: Self-pay | Admitting: Advanced Practice Midwife

## 2019-01-13 ENCOUNTER — Ambulatory Visit (INDEPENDENT_AMBULATORY_CARE_PROVIDER_SITE_OTHER): Payer: Managed Care, Other (non HMO) | Admitting: Advanced Practice Midwife

## 2019-01-13 VITALS — BP 104/66 | Wt 161.0 lb

## 2019-01-13 DIAGNOSIS — Z3A01 Less than 8 weeks gestation of pregnancy: Secondary | ICD-10-CM

## 2019-01-13 DIAGNOSIS — O34219 Maternal care for unspecified type scar from previous cesarean delivery: Secondary | ICD-10-CM

## 2019-01-13 DIAGNOSIS — O219 Vomiting of pregnancy, unspecified: Secondary | ICD-10-CM

## 2019-01-13 MED ORDER — DOXYLAMINE-PYRIDOXINE 10-10 MG PO TBEC: 2.0000 | DELAYED_RELEASE_TABLET | Freq: Every day | ORAL | 4 refills | Status: DC

## 2019-01-13 NOTE — Progress Notes (Signed)
  Routine Prenatal Care Visit  Subjective  Melissa Coffey is a 22 y.o. [phone] at [redacted]w[redacted]d being seen today for ongoing prenatal care.  She is currently monitored for the following issues for this low-risk pregnancy and has Anemia in pregnancy; Previous cesarean delivery, antepartum condition or complication; Status post cesarean delivery; Anxiety and depression; Compulsive skin picking; Sepsis (HCC); and Supervision of other normal pregnancy, antepartum on their problem list.  ----------------------------------------------------------------------------------- Patient reports headache and nausea.  She requests Rx for Diclegis since the zofran is wearing off prior to 6 hour dose time. She was unable to stay last Thursday for ROB following dating scan since her car broke down.   . Vag. Bleeding: None.   . Denies leaking of fluid.  ----------------------------------------------------------------------------------- The following portions of the patient's history were reviewed and updated as appropriate: allergies, current medications, past family history, past medical history, past social history, past surgical history and problem list. Problem list updated.   Objective  Blood pressure 104/66, weight 161 lb (73 kg), last menstrual period 11/19/2018 Pregravid weight 155 lb (70.3 kg) Total Weight Gain 6 lb (2.722 kg) Urinalysis: Urine Protein    Urine Glucose    Fetal Status:         Had dating scan on 3/12 EDD by LMP c/w 7w u/s FHTs 151 2 [facility] measuring: 1.2 x 0.6 and 0.7 x 0.5  General:  Alert, oriented and cooperative. Patient is in no acute distress.  Skin: Skin is warm and dry. No rash noted.   Cardiovascular: Normal heart rate noted  Respiratory: Normal respiratory effort, no problems with respiration noted  Abdomen: Soft, gravid, appropriate for gestational age.       Pelvic:  Cervical exam deferred        Extremities: Normal range of motion.     Mental Status: Normal mood and affect. Normal  behavior. Normal judgment and thought content.   Assessment   23 y.o. [id number] at [redacted]w[redacted]d by  08/26/2019, by Last Menstrual Period presenting for routine prenatal visit  Plan   Pregnancy #4 Problems (from 01/03/19 to present)    No problems associated with this episode.       Preterm labor symptoms and general obstetric precautions including but not limited to vaginal bleeding, contractions, leaking of fluid and fetal movement were reviewed in detail with the patient.   Return in about 4 weeks (around 02/10/2019) for rob.  Tresea Mall, CNM 01/13/2019 3:18 PM

## 2019-01-13 NOTE — Progress Notes (Signed)
ROB

## 2019-02-04 ENCOUNTER — Emergency Department: Payer: Managed Care, Other (non HMO)

## 2019-02-04 ENCOUNTER — Emergency Department
Admission: EM | Admit: 2019-02-04 | Discharge: 2019-02-05 | Disposition: A | Discharge status: Home / Self Care | Payer: Managed Care, Other (non HMO) | Attending: Emergency Medicine | Admitting: Emergency Medicine

## 2019-02-04 ENCOUNTER — Other Ambulatory Visit: Payer: Self-pay

## 2019-02-04 ENCOUNTER — Encounter: Payer: Self-pay | Admitting: Emergency Medicine

## 2019-02-04 DIAGNOSIS — R1030 Lower abdominal pain, unspecified: Secondary | ICD-10-CM | POA: Insufficient documentation

## 2019-02-04 DIAGNOSIS — Z3A01 Less than 8 weeks gestation of pregnancy: Secondary | ICD-10-CM | POA: Insufficient documentation

## 2019-02-04 DIAGNOSIS — R350 Frequency of micturition: Secondary | ICD-10-CM | POA: Insufficient documentation

## 2019-02-04 DIAGNOSIS — Z79899 Other long term (current) drug therapy: Secondary | ICD-10-CM | POA: Diagnosis not present

## 2019-02-04 DIAGNOSIS — N3000 Acute cystitis without hematuria: Secondary | ICD-10-CM | POA: Diagnosis not present

## 2019-02-04 DIAGNOSIS — O26891 Other specified pregnancy related conditions, first trimester: Secondary | ICD-10-CM | POA: Diagnosis present

## 2019-02-04 DIAGNOSIS — R109 Unspecified abdominal pain: Secondary | ICD-10-CM

## 2019-02-04 DIAGNOSIS — Z87891 Personal history of nicotine dependence: Secondary | ICD-10-CM | POA: Diagnosis not present

## 2019-02-04 LAB — CBC WITH DIFFERENTIAL/PLATELET
Abs Immature Granulocytes: 0.06 10*3/uL (ref 0.00–0.07)
Basophils Absolute: 0.1 10*3/uL (ref 0.0–0.1)
Basophils Relative: 0 %
Eosinophils Absolute: 0.1 10*3/uL (ref 0.0–0.5)
Eosinophils Relative: 1 %
HCT: 38.6 % (ref 36.0–46.0)
Hemoglobin: 13.5 g/dL (ref 12.0–15.0)
Immature Granulocytes: 1 %
Lymphocytes Relative: 25 %
Lymphs Abs: 2.9 10*3/uL (ref 0.7–4.0)
MCH: 29.9 pg (ref 26.0–34.0)
MCHC: 35 g/dL (ref 30.0–36.0)
MCV: 85.6 fL (ref 80.0–100.0)
Monocytes Absolute: 0.6 10*3/uL (ref 0.1–1.0)
Monocytes Relative: 5 %
Neutro Abs: 7.7 10*3/uL (ref 1.7–7.7)
Neutrophils Relative %: 68 %
Platelets: 308 10*3/uL (ref 150–400)
RBC: 4.51 MIL/uL (ref 3.87–5.11)
RDW: 13.2 % (ref 11.5–15.5)
WBC: 11.4 10*3/uL — ABNORMAL HIGH (ref 4.0–10.5)
nRBC: 0 % (ref 0.0–0.2)

## 2019-02-04 LAB — URINALYSIS, COMPLETE (UACMP) WITH MICROSCOPIC
Bilirubin Urine: NEGATIVE
Glucose, UA: NEGATIVE mg/dL
Hgb urine dipstick: NEGATIVE
Ketones, ur: NEGATIVE mg/dL
Nitrite: NEGATIVE
Protein, ur: NEGATIVE mg/dL
Specific Gravity, Urine: 1.016 (ref 1.005–1.030)
pH: 6 (ref 5.0–8.0)

## 2019-02-04 LAB — COMPREHENSIVE METABOLIC PANEL
ALT: 12 U/L (ref 0–44)
AST: 13 U/L — ABNORMAL LOW (ref 15–41)
Albumin: 3.7 g/dL (ref 3.5–5.0)
Alkaline Phosphatase: 59 U/L (ref 38–126)
Anion gap: 8 (ref 5–15)
BUN: 11 mg/dL (ref 6–20)
CO2: 23 mmol/L (ref 22–32)
Calcium: 9.9 mg/dL (ref 8.9–10.3)
Chloride: 107 mmol/L (ref 98–111)
Creatinine, Ser: 0.35 mg/dL — ABNORMAL LOW (ref 0.44–1.00)
GFR calc Af Amer: >60 mL/min (ref >60)
GFR calc non Af Amer: >60 mL/min (ref >60)
Glucose, Bld: 95 mg/dL (ref 70–99)
Potassium: 4.2 mmol/L (ref 3.5–5.1)
Sodium: 138 mmol/L (ref 135–145)
Total Bilirubin: 0.4 mg/dL (ref 0.3–1.2)
Total Protein: 7.6 g/dL (ref 6.5–8.1)

## 2019-02-04 LAB — HCG, QUANTITATIVE, PREGNANCY: hCG, Beta Chain, Quant, S: 56871 m[IU]/mL — ABNORMAL HIGH (ref <5)

## 2019-02-04 MED ORDER — MORPHINE SULFATE (PF) 2 MG/ML IV SOLN
2.0000 mg | Freq: Once | INTRAVENOUS | Status: AC
  Administered 2019-02-04: 23:00:00 2 mg via INTRAVENOUS

## 2019-02-04 MED ORDER — MORPHINE SULFATE (PF) 2 MG/ML IV SOLN
INTRAVENOUS | Status: AC
  Administered 2019-02-04: 2 mg via INTRAVENOUS
  Filled 2019-02-04: qty 1

## 2019-02-04 NOTE — ED Provider Notes (Signed)
[facility] Emergency Department Provider Note   First MD Initiated Contact with Patient 02/04/19 2258     (approximate)  I have reviewed the triage vital signs and the nursing notes.   HISTORY  Chief Complaint Flank Pain    HPI Melissa Coffey is a 23 y.o. female with medical history as listed below G4, P2 approximately [redacted] weeks pregnant followed by Melissa Coffey presents emergency department with 6 out of 10 left flank pain and urinary frequency.  Patient denies any dysuria.  Patient denies any fever afebrile on presentation.  Patient denies any nausea or vomiting.  Patient denies any diarrhea or constipation.        Past Medical History:  Diagnosis Date  . Anemia   . Depression   . Endometriosis   . UTI (urinary tract infection)     Patient Active Problem List   Diagnosis Date Noted  . Supervision of other normal pregnancy, antepartum 01/03/2019  . Sepsis (HCC) 06/22/2018  . Compulsive skin picking 11/30/2017  . Anxiety and depression 11/29/2017  . Previous cesarean delivery, antepartum condition or complication 05/09/2016  . Status post cesarean delivery 05/09/2016  . Anemia in pregnancy 11/11/2015    Past Surgical History:  Procedure Laterality Date  . ABDOMINAL SURGERY    . CESAREAN SECTION  02/2014   pLTCS. FITL at term  . CESAREAN SECTION N/A 05/09/2016   Procedure: CESAREAN SECTION;  Surgeon: Conard Novak, MD;  Location: [facility] ORS;  Service: Obstetrics;  Laterality: N/A;  . NECK SURGERY  2003  . TONSILLECTOMY      Prior to Admission medications   Medication Sig Start Date End Date Taking? Authorizing Provider  cephALEXin (KEFLEX) 500 MG capsule Take 1 capsule (500 mg total) by mouth 2 (two) times daily for 7 days. 02/05/19 02/12/19  Darci Current, MD  Doxylamine-Pyridoxine (DICLEGIS) 10-10 MG TBEC Take 2 tablets by mouth at bedtime. If symptoms persist, add one tablet in the morning and one in the afternoon 01/13/19   Tresea Mall, CNM  ferrous sulfate 325 (65 FE) MG tablet Take 325 mg by mouth 2 (two) times daily with a meal.     [provider]  ondansetron (ZOFRAN ODT) 4 MG disintegrating tablet Take 1 tablet (4 mg total) by mouth every 6 (six) hours as needed for nausea. 01/03/19   Tresea Mall, CNM  Prenatal Vit-Fe Fumarate-FA (MULTIVITAMIN-PRENATAL) 27-0.8 MG TABS tablet Take 1 tablet by mouth daily at 12 noon.    [provider]    Allergies Patient has no known allergies.  Family History  Problem Relation Age of Onset  . Diabetes Maternal Grandmother   . Hypertension Maternal Grandmother   . Cancer Maternal Grandmother   . Breast cancer Maternal Grandmother   . Diabetes Maternal Uncle   . Healthy Mother   . Healthy Brother   . Healthy Daughter   . Healthy Son   . Healthy Brother   . Stroke Neg Hx   . Heart attack Neg Hx   . Colon cancer Neg Hx   . Ovarian cancer Neg Hx     Social History Social History   Tobacco Use  . Smoking status: Former Smoker    Packs/day: 0.50    Years: 0.50    Pack years: 0.25    Types: Cigarettes    Last attempt to quit: 12/03/2018    Years since quitting: 0.1  . Smokeless tobacco: Never Used  . Tobacco comment: quit w/+pregnancy test  Substance Use Topics  . Alcohol use: No  . Drug use: Not Currently    Review of Systems Constitutional: No fever/chills Eyes: No visual changes. ENT: No sore throat. Cardiovascular: Denies chest pain. Respiratory: Denies shortness of breath. Gastrointestinal: Positive for left flank pain.  No nausea, no vomiting.  No diarrhea.  No constipation. Genitourinary: Negative for dysuria. Musculoskeletal: Negative for neck pain.  Negative for back pain. Integumentary: Negative for rash. Neurological: Negative for headaches, focal weakness or numbness.   ____________________________________________   PHYSICAL EXAM:  VITAL SIGNS: ED Triage Vitals  Enc Vitals Group     BP --      Pulse --      Resp --       Temp --      Temp src --      SpO2 --      Weight 02/04/19 2030 72.6 kg (160 lb)     Height 02/04/19 2030 1.6 m ( )     Head Circumference --      Peak Flow --      Pain Score 02/04/19 2029 7     Pain Loc --      Pain Edu? --      Excl. in GC? --     Constitutional: Alert and oriented. Well appearing and in no acute distress. Eyes: Conjunctivae are normal.  Mouth/Throat: Mucous membranes are moist. Oropharynx non-erythematous. Neck: No stridor.   Cardiovascular: Normal rate, regular rhythm. Good peripheral circulation. Grossly normal heart sounds. Respiratory: Normal respiratory effort.  No retractions. Lungs CTAB. Gastrointestinal: Soft and nontender. No distention.  Musculoskeletal: No lower extremity tenderness nor edema. No gross deformities of extremities. Neurologic:  Normal speech and language. No gross focal neurologic deficits are appreciated.  Skin:  Skin is warm, dry and intact. No rash noted. Psychiatric: Mood and affect are normal. Speech and behavior are normal.  ____________________________________________   LABS (all labs ordered are listed, but only abnormal results are displayed)  Labs Reviewed  URINALYSIS, COMPLETE (UACMP) WITH MICROSCOPIC - Abnormal; Notable for the following components:      Result Value   Color, Urine YELLOW (*)    APPearance HAZY (*)    Leukocytes,Ua MODERATE (*)    Bacteria, UA RARE (*)    All other components within normal limits  CBC WITH DIFFERENTIAL/PLATELET - Abnormal; Notable for the following components:   WBC 11.4 (*)    All other components within normal limits  COMPREHENSIVE METABOLIC PANEL - Abnormal; Notable for the following components:   Creatinine, Ser 0.35 (*)    AST 13 (*)    All other components within normal limits  HCG, QUANTITATIVE, PREGNANCY - Abnormal; Notable for the following components:   hCG, Beta Chain, Quant, S 16,109 (*)    All other components within normal limits    ____________________________________________    RADIOLOGY I, Valentine N Kymia Simi, personally viewed and evaluated these images (plain radiographs) as part of my medical decision making, as well as reviewing the written report by the radiologist.  ED MD interpretation: Ultrasound revealed single viable intrauterine pregnancy without complication 11 weeks 5 days.  Renal ultrasound revealed no hydronephrosis or any other acute abnormality per radiologist.  Official radiology report(s): US Ob Comp Less 14 Wks  Result Date: 02/05/2019 CLINICAL DATA:  Initial evaluation for lower abdominal cramping with left flank pain. EXAM: OBSTETRIC <14 WK ULTRASOUND TECHNIQUE: Transabdominal ultrasound was performed for evaluation of the gestation as well as the maternal uterus and adnexal regions. COMPARISON:  Prior CT from 06/22/2018 FINDINGS: Intrauterine gestational sac: Single Yolk sac:  Negative. Embryo:  Present Cardiac Activity: Present Heart Rate: 160 bpm CRL: 49.0 mm   11 w 5 d                  [place] EDC: 08/21/2019 Subchorionic hemorrhage:  None visualized. Maternal uterus/adnexae: Ovaries are normal in appearance bilaterally. No adnexal mass. No free fluid. IMPRESSION: 1. Single viable intrauterine pregnancy as above without complication, estimated gestational age [redacted] weeks and 5 days by crown-rump length, with ultrasound EDC of 08/21/2019. 2. No other acute maternal uterine or adnexal abnormality. Electronically Signed   By: Rise Mu M.D.   On: 02/05/2019 00:59   US Renal  Result Date: 02/05/2019 CLINICAL DATA:  Initial evaluation for acute lower abdominal pain with left flank pain, early pregnancy. EXAM: RENAL / URINARY TRACT ULTRASOUND COMPLETE COMPARISON:  Prior CT from 06/22/2018 FINDINGS: Right Kidney: Renal measurements: 11.2 x 4.8 x 5.4 cm = volume: 151.8 mL . Echogenicity within normal limits. No mass or hydronephrosis visualized. No shadowing foci to suggest nephrolithiasis. Left Kidney:  Renal measurements: 10.7 x 5.7 x 5.6 cm = volume: 177.6 mL. Echogenicity within normal limits. No mass or hydronephrosis visualized. No shadowing foci to suggest nephrolithiasis. Bladder: Appears normal for degree of bladder distention. Bilateral ureteral jets visualized. IMPRESSION: Normal renal ultrasound. No hydronephrosis or other acute abnormality. Electronically Signed   By: Rise Mu M.D.   On: 02/05/2019 01:00     Procedures   ____________________________________________   INITIAL IMPRESSION / MDM / ASSESSMENT AND PLAN / ED COURSE  As part of my medical decision making, I reviewed the following data within the electronic MEDICAL RECORD NUMBER    23 year old female presenting with above-stated history and physical exam secondary to left flank pain raising concern for possible ureterolithiasis nephrolithiasis pyelonephritis.  Urinalysis consistent with UTI and as such Keflex was given.  Ultrasound performed revealed no evidence of ureterolithiasis nephrolithiasis or any other acute abnormality per the radiologist.  Patient will be prescribed Keflex for home with recommendation to follow-up with OB/GYN.  ZATORIA GONDER was evaluated in Emergency Department on 02/05/2019 for the symptoms described in the history of present illness. She was evaluated in the context of the global COVID-19 pandemic, which necessitated consideration that the patient might be at risk for infection with the SARS-CoV-2 virus that causes COVID-19. Institutional protocols and algorithms that pertain to the evaluation of patients at risk for COVID-19 are in a state of rapid change based on information released by regulatory bodies including the CDC and federal and state organizations. These policies and algorithms were followed during the patient's care in the ED.           ____________________________________________  FINAL CLINICAL IMPRESSION(S) / ED DIAGNOSES  Final diagnoses:  Acute cystitis without  hematuria     MEDICATIONS GIVEN DURING THIS VISIT:  Medications  morphine 2 MG/ML injection 2 mg (2 mg Intravenous Given 02/04/19 2322)  acetaminophen (TYLENOL) tablet 650 mg (650 mg Oral Given 02/05/19 0104)  cephALEXin (KEFLEX) capsule 500 mg (500 mg Oral Given 02/05/19 0104)     ED Discharge Orders         Ordered    cephALEXin (KEFLEX) 500 MG capsule  2 times daily     02/05/19 0152           Note:  This document was prepared using Dragon voice recognition software and may include unintentional dictation errors.   Manson Passey,  Enedina Finnerandolph N, MD 02/05/19 [phone]

## 2019-02-04 NOTE — ED Triage Notes (Signed)
Patient ambulatory to triage with steady gait, without difficulty or distress noted; pt reports since this am having left flank pain accomp by nausea; denies urinary c/o; also reports generalized HA; took 1 tylenol 3hrs PTA without relief; G4P2, approx [redacted]wk pregnant, pt at [facility] with no complication; denies any vag discharge or bleeding but has had some lower abd cramping

## 2019-02-05 MED ORDER — CEPHALEXIN 500 MG PO CAPS
500.0000 mg | ORAL_CAPSULE | Freq: Once | ORAL | Status: AC
  Administered 2019-02-05: 500 mg via ORAL
  Filled 2019-02-05: qty 1

## 2019-02-05 MED ORDER — CEPHALEXIN 500 MG PO CAPS: 500.0000 mg | ORAL_CAPSULE | Freq: Two times a day (BID) | ORAL | 0 refills | Status: AC

## 2019-02-05 MED ORDER — ACETAMINOPHEN 325 MG PO TABS
650.0000 mg | ORAL_TABLET | Freq: Once | ORAL | Status: AC
  Administered 2019-02-05: 650 mg via ORAL
  Filled 2019-02-05: qty 2

## 2019-02-05 NOTE — ED Notes (Signed)
Pt in ultrasound

## 2019-02-10 ENCOUNTER — Encounter: Payer: Managed Care, Other (non HMO) | Admitting: Advanced Practice Midwife

## 2019-02-10 ENCOUNTER — Other Ambulatory Visit: Payer: Self-pay

## 2019-02-10 ENCOUNTER — Ambulatory Visit (INDEPENDENT_AMBULATORY_CARE_PROVIDER_SITE_OTHER): Payer: Managed Care, Other (non HMO) | Admitting: Obstetrics and Gynecology

## 2019-02-10 ENCOUNTER — Encounter: Payer: Self-pay | Admitting: Obstetrics and Gynecology

## 2019-02-10 VITALS — BP 102/50 | Wt 165.0 lb

## 2019-02-10 DIAGNOSIS — O34219 Maternal care for unspecified type scar from previous cesarean delivery: Secondary | ICD-10-CM

## 2019-02-10 DIAGNOSIS — Z3480 Encounter for supervision of other normal pregnancy, unspecified trimester: Secondary | ICD-10-CM

## 2019-02-10 DIAGNOSIS — Z3A11 11 weeks gestation of pregnancy: Secondary | ICD-10-CM

## 2019-02-10 NOTE — Patient Instructions (Signed)
Hello,  Given the current COVID-19 pandemic, our practice is making changes in how we are providing care to our patients. We are limiting in-person visits for the safety of all of our patients.   As a practice, we have met to discuss the best way to minimize visits, but still provide excellent care to our expecting mothers.  We have decided on the following visit structure for low-risk pregnancies.  Initial Pregnancy visit will be conducted as a telephone or web visit.  Between 10-14 weeks  there will be one in-person visit for an ultrasound, lab work, and genetic screening. 20 weeks in-person visit with an anatomy ultrasound  28 weeks in-person office visit for a 1-hour glucose test and a TDAP vaccination 32 weeks in-person office visit 34 weeks telephone visit 36 weeks in-person office visit for GBS, chlamydia, and gonorrhea testing 38 weeks in-person office visit 40 weeks in-person office visit  Understandably, some patients will require more visits than what is outlined above. Additional visits will be determined on a case-by-case basis.   We will, as always, be available for emergencies or to address concerns that might arise between in-person visits. We ask that you allow [place] the opportunity to address any concerns over the phone or through a virtual visit first. We will be available to return your phone calls throughout the day.   If you are able to purchase a scale, a blood pressure machine, and a home fetal doppler visits could be limited further. This will help decrease your exposure risks, but these purchases are not a necessity.   Things seem to change daily and there is the possibility that this structure could change, please be patient as we adapt to a new way of caring for patients.   Thank you for trusting [place] with your prenatal care. Our practice values you and looks forward to providing you with excellent care.   Sincerely,   [facility], [place]     COVID-19 and Your Pregnancy FAQ  How can I prevent infection with COVID-19 during my pregnancy? Social distancing is key. Please limit any interactions in public. Try and work from home if possible. Frequently wash your hands after touching possibly contaminated surfaces. Avoid touching your face.  Minimize trips to the store. Consider online ordering when possible.   Should I wear a mask? YES. It is recommended by the CDC that all people wear a cloth mask or facial covering in public. This will help reduce transmission as well as your risk or acquiring COVID-19. New studies are showing that even asymptomatic individuals can spread the virus from talking.   What are the symptoms of COVID-19? Fever (greater than 100.4 F), dry cough, shortness of breath.  Am I more at risk for COVID-19 since I am pregnant? There is not currently data showing that pregnant women are more adversely impacted by COVID-19 than the general population. However, we know that pregnant women tend to have worse respiratory complications from similar diseases such as the flu and SARS and for this reason should be considered an at-risk population.  What do I do if I am experiencing the symptoms of COVID-19? Testing is being limited because of test availability. If you are experiencing symptoms you should quarantine yourself, and the members of your family, for at least 2 weeks at home.   Please visit this website for more information: [email or web address]  When should I go to the Emergency Room? Please go to the emergency room if you  are experiencing ANY of these symptoms*:  1.    Difficulty breathing or shortness of breath 2.    Persistent pain or pressure in the chest 3.    Confusion or difficulty being aroused (or awakened) 4.    Bluish lips or face  *This list is not all inclusive. Please consult our office for any other symptoms that are severe or  concerning.  What do I do if I am having difficulty breathing? You should go to the Emergency Room for evaluation. At this time they have a tent set up for evaluating patients with COVID-19 symptoms.   How will my prenatal care be different because of the COVID-19 pandemic? It has been recommended to reduce the frequency of face-to-face visits and use resources such as telephone and virtual visits when possible. Using a scale, blood pressure machine and fetal doppler at home can further help reduce face-to-face visits. You will be provided with additional information on this topic.  We ask that you come to your visits alone to minimize potential exposures to  COVID-19.  How can I receive childbirth education? At this time in-person classes have been cancelled. You can register for online childbirth education, breastfeeding, and newborn care classes.  Please visit:  [email or web address] for more information  How will my hospital birth experience be different? The hospital is currently limiting visitors. This means that while you are in labor you can only have one person at the hospital with you. Additional family members will not be allowed to wait in the building or outside your room. Your one support person can be the father of the baby, a relative, a doula, or a friend. Once one support person is designated that person will wear a band. This band cannot be shared with multiple people.  Nitrous Gas is not being offered for pain relief since the tubing and filter for the machine can not be sanitized in a way to guarantee prevention of transmission of COVID-19.  Nasal cannula use of oxygen for fetal indications has also been discontinued.  Currently a clear plastic sheet is being hung between mom and the delivering provider during pushing and delivery to help prevent transmission of COVID-19.      How long will I stay in the hospital for after giving birth? It is also recommended that  discharge home be expedited during the COVID-19 outbreak. This means staying for 1 day after a vaginal delivery and 2 days after a cesarean section. Patients who need to stay longer for medical reasons are allowed to do so, but the goal will be for expedited discharge home.   What if I have COVID-19 and I am in labor? We ask that you wear a mask while on labor and delivery. We will try and accommodate you being placed in a room that is capable of filtering the air. Please call ahead if you are in labor and on your way to the hospital. The phone number for labor and delivery at  [facility] is [phone].  If I have COVID-19 when my baby is born how can I prevent my baby from contracting COVID-19? This is an issue that will have to be discussed on a case-by-case basis. Current recommendations suggest providing separate isolation rooms for both the mother and new infant as well as limiting visitors. However, there are practical challenges to this recommendation. The situation will assuredly change and decisions will be influenced by the desires of the mother and availability of space.    Some suggestions are the use of a curtain or physical barrier between mom and infant, hand hygiene, mom wearing a mask, or 6 feet of spacing between a mom and infant.   Can I breastfeed during the COVID-19 pandemic?   Yes, breastfeeding is encouraged.  Can I breastfeed if I have COVID-19? Yes. Covid-19 has not been found in breast milk. This means you cannot give COVID-19 to your child through breast milk. Breast feeding will also help pass antibodies to fight infection to your baby.   What precautions should I take when breastfeeding if I have COVID-19? If a mother and newborn do room-in and the mother wishes to feed at the breast, she should put on a facemask and practice hand hygiene before each feeding.  What precautions should I take when pumping if I have COVID-19? Prior to expressing  breast milk, mothers should practice hand hygiene. After each pumping session, all parts that come into contact with breast milk should be thoroughly washed and the entire pump should be appropriately disinfected per the manufacturer's instructions. This expressed breast milk should be fed to the newborn by a healthy caregiver.  What if I am pregnant and work in healthcare? Based on limited data regarding COVID-19 and pregnancy, ACOG currently does not propose creating additional restrictions on pregnant health care personnel because of COVID-19 alone. Pregnant women do not appear to be at higher risk of severe disease related to COVID-19. Pregnant health care personnel should follow CDC risk assessment and infection control guidelines for health care personnel exposed to patients with suspected or confirmed COVID-19. Adherence to recommended infection prevention and control practices is an important part of protecting all health care personnel in health care settings.    Information on COVID-19 in pregnancy is very limited; however, facilities may want to consider limiting exposure of pregnant health care personnel to patients with confirmed or suspected COVID-19 infection, especially during higher-risk procedures (eg, aerosol-generating procedures), if feasible, based on staffing availability.

## 2019-02-10 NOTE — Progress Notes (Signed)
ROB C/o nausea, no vomiting

## 2019-02-10 NOTE — Progress Notes (Signed)
    Routine Prenatal Care Visit  Subjective  Melissa Coffey is a 23 y.o. [id number] at [redacted]w[redacted]d being seen today for ongoing prenatal care.  She is currently monitored for the following issues for this low-risk pregnancy and has Anemia in pregnancy; Previous cesarean delivery, antepartum condition or complication; Status post cesarean delivery; Anxiety and depression; Compulsive skin picking; Sepsis (HCC); and Supervision of other normal pregnancy, antepartum on their problem list.  ----------------------------------------------------------------------------------- Patient reports no complaints.    . Vag. Bleeding: None.  Movement: Absent. Denies leaking of fluid.  ----------------------------------------------------------------------------------- The following portions of the patient's history were reviewed and updated as appropriate: allergies, current medications, past family history, past medical history, past social history, past surgical history and problem list. Problem list updated.   Objective  Blood pressure (!) 102/50, weight 165 lb (74.8 kg), last menstrual period 11/19/2018, unknown if currently breastfeeding. Pregravid weight 155 lb (70.3 kg) Total Weight Gain 10 lb (4.536 kg) Urinalysis:      Fetal Status: Fetal Heart Rate (bpm): 160   Movement: Absent     General:  Alert, oriented and cooperative. Patient is in no acute distress.  Skin: Skin is warm and dry. No rash noted.   Cardiovascular: Normal heart rate noted  Respiratory: Normal respiratory effort, no problems with respiration noted  Abdomen: Soft, gravid, appropriate for gestational age. Pain/Pressure: Absent     Pelvic:  Cervical exam deferred        Extremities: Normal range of motion.  Edema: None  Mental Status: Normal mood and affect. Normal behavior. Normal judgment and thought content.     Assessment   23 y.o. [id number] at [redacted]w[redacted]d by  08/26/2019, by Last Menstrual Period presenting for routine prenatal visit   Plan    Gestational age appropriate obstetric precautions including but not limited to vaginal bleeding, contractions, leaking of fluid and fetal movement were reviewed in detail with the patient.    Return in about 4 weeks (around 03/10/2019) for Telephone ROB visit.  Natale Milch MD [facility], [facility] 02/10/2019, 3:35 PM

## 2019-02-27 ENCOUNTER — Telehealth: Payer: Self-pay
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
## 2019-02-27 NOTE — Telephone Encounter (Signed)
Per Tillman Sers, CNM, pt needs to be evaluated at ER. Unable to reach pt by phone. vm not set up yet.

## 2019-03-10 ENCOUNTER — Other Ambulatory Visit: Payer: Self-pay

## 2019-03-10 ENCOUNTER — Encounter: Payer: Self-pay | Admitting: Maternal Newborn

## 2019-03-10 ENCOUNTER — Ambulatory Visit (INDEPENDENT_AMBULATORY_CARE_PROVIDER_SITE_OTHER): Payer: Managed Care, Other (non HMO) | Admitting: Maternal Newborn

## 2019-03-10 VITALS — Wt 168.0 lb

## 2019-03-10 DIAGNOSIS — O26892 Other specified pregnancy related conditions, second trimester: Secondary | ICD-10-CM

## 2019-03-10 DIAGNOSIS — Z3480 Encounter for supervision of other normal pregnancy, unspecified trimester: Secondary | ICD-10-CM

## 2019-03-10 DIAGNOSIS — N898 Other specified noninflammatory disorders of vagina: Secondary | ICD-10-CM

## 2019-03-10 DIAGNOSIS — Z3A15 15 weeks gestation of pregnancy: Secondary | ICD-10-CM

## 2019-03-10 DIAGNOSIS — Z3689 Encounter for other specified antenatal screening: Secondary | ICD-10-CM

## 2019-03-10 NOTE — Progress Notes (Signed)
Pt identified by address & DOB. C/o pressure/pain x past few days with white milky vag d/c.

## 2019-03-10 NOTE — Patient Instructions (Signed)

## 2019-03-10 NOTE — Progress Notes (Signed)
Routine Prenatal Care Visit - Virtual Visit  Subjective   Virtual Visit via Telephone Note   I connected with Melissa Coffey on 03/10/19 at 1:40 PM EDT by telephone and verified that I am speaking with the correct person using two identifiers.   I discussed the limitations of performing an evaluation and management service by telephone and the availability of in person appointments. The patient expressed understanding and agreed to proceed.  The patient was at home. I spoke with the patient from the office. The names of people involved in this encounter were: Melissa Coffey  and Melissa Coffey.   Melissa Coffey is a 23 y.o. [phone] at [redacted]w[redacted]d being seen today for ongoing prenatal care.  She is currently monitored for the following issues for this low-risk pregnancy and has Anemia in pregnancy; Previous cesarean delivery, antepartum condition or complication; Status post cesarean delivery; Anxiety and depression; Compulsive skin picking; Sepsis (HCC); and Supervision of other normal pregnancy, antepartum on their problem list.  ----------------------------------------------------------------------------------- Patient reports a feeling of vaginal pain/pressure that has been ongoing for about a week. She has some increased white discharge which sometimes looks like cottage cheese and is sometimes thin. She does not have vaginal or vulvar itching or burning. No dysuria. Vag. Bleeding: None.  Movement: Present. No leaking of fluid.  ----------------------------------------------------------------------------------- The following portions of the patient's history were reviewed and updated as appropriate: allergies, current medications, past family history, past medical history, past social history, past surgical history and problem list. Problem list updated.  Objective  Last menstrual period 11/19/2018. Pregravid weight 155 lb (70.3 kg) Total Weight Gain 13 lb (5.897 kg)  Fetal Status:      Movement: Present     Physical Exam could not be performed. Because of the COVID-19 outbreak this visit was performed over the phone and not in person.   Assessment   23 y.o. [id number] at [redacted]w[redacted]d, EDD10/27/2020 by Last Menstrual Period presenting for a routine prenatal visit  Plan   Pregnancy #4 Problems (from 01/03/19 to present)    Problem Noted Resolved   Supervision of other normal pregnancy, antepartum 01/03/2019 by Tresea Mall, CNM No   Overview Addendum 02/10/2019  3:28 PM by Natale Milch, MD    Clinic [facility] Prenatal Labs  Dating  LMP=7wk [place] Blood type: O/Positive/-- (03/06 1453)   Genetic Screen  declines all Antibody:Negative (03/06 1453)  Anatomic [place]  Rubella: <0.90 NONIMMUNE  Varicella: IMMUNE  GTT  Third trimester:  RPR: Non Reactive (03/06 1453)   Rhogam  not needed HBsAg: Negative (03/06 1453)   TDaP vaccine                       Flu Shot: HIV: Non Reactive (03/06 1453)   Baby Food                                GBS:   Contraception  Pap: LSIL 2019  CBB     CS/VBAC C/s x2 2015 FITL, 2017 placental abruption   Support Person Partner Weston Brass          Previous cesarean delivery, antepartum condition or complication 05/09/2016 by Conard Novak, MD No      Gestational age appropriate obstetric precautions were reviewed with the patient.    Discussed anatomy scan to be done at next routine ROB.  Follow Up Instructions: Patient will come to the office  for an in-person visit to be evaluated for a vaginal infection.   I discussed the assessment and treatment plan with the patient. The patient was provided an opportunity to ask questions and all were answered. The patient agreed with the plan and demonstrated an understanding of the instructions.  I provided 6 minutes of non-face-to-face time during this encounter.  Return in about 1 day (around 03/11/2019) for ROB/vaginal discharge.   Also schedule ROB with anatomy scan for 4 weeks.  Melissa BruinsJacelyn Jaicion Laurie, CNM  [facility], [facility] 03/10/2019

## 2019-03-11 ENCOUNTER — Ambulatory Visit (INDEPENDENT_AMBULATORY_CARE_PROVIDER_SITE_OTHER): Payer: Managed Care, Other (non HMO) | Admitting: Certified Nurse Midwife

## 2019-03-11 ENCOUNTER — Other Ambulatory Visit: Payer: Self-pay

## 2019-03-11 ENCOUNTER — Other Ambulatory Visit (HOSPITAL_COMMUNITY)
Admission: RE | Admit: 2019-03-11 | Discharge: 2019-03-11 | Disposition: A | Discharge status: Home / Self Care | Payer: Managed Care, Other (non HMO) | Source: Ambulatory Visit | Attending: Certified Nurse Midwife | Admitting: Certified Nurse Midwife

## 2019-03-11 VITALS — BP 90/60 | Wt 168.0 lb

## 2019-03-11 DIAGNOSIS — O26892 Other specified pregnancy related conditions, second trimester: Secondary | ICD-10-CM

## 2019-03-11 DIAGNOSIS — Z3A16 16 weeks gestation of pregnancy: Secondary | ICD-10-CM

## 2019-03-11 DIAGNOSIS — R42 Dizziness and giddiness: Secondary | ICD-10-CM

## 2019-03-11 DIAGNOSIS — N898 Other specified noninflammatory disorders of vagina: Secondary | ICD-10-CM | POA: Diagnosis present

## 2019-03-11 DIAGNOSIS — Z3480 Encounter for supervision of other normal pregnancy, unspecified trimester: Secondary | ICD-10-CM

## 2019-03-11 MED ORDER — FERROUS SULFATE 325 (65 FE) MG PO TABS: 325.0000 mg | ORAL_TABLET | Freq: Every day | ORAL | 6 refills | Status: DC

## 2019-03-11 NOTE — Progress Notes (Signed)
Vaginal Discharge- no odor/itchiness/irritation, pt states some days it is milky others cottage cheese looking like for about a week

## 2019-03-12 LAB — CBC WITH DIFFERENTIAL/PLATELET
Basophils Absolute: 0.1 10*3/uL (ref 0.0–0.2)
Basos: 1 %
EOS (ABSOLUTE): 0.1 10*3/uL (ref 0.0–0.4)
Eos: 1 %
Hematocrit: 36.7 % (ref 34.0–46.6)
Hemoglobin: 12.5 g/dL (ref 11.1–15.9)
Immature Grans (Abs): 0.1 10*3/uL (ref 0.0–0.1)
Immature Granulocytes: 1 %
Lymphocytes Absolute: 2.2 10*3/uL (ref 0.7–3.1)
Lymphs: 21 %
MCH: 29.7 pg (ref 26.6–33.0)
MCHC: 34.1 g/dL (ref 31.5–35.7)
MCV: 87 fL (ref 79–97)
Monocytes Absolute: 0.5 10*3/uL (ref 0.1–0.9)
Monocytes: 4 %
Neutrophils Absolute: 7.6 10*3/uL — ABNORMAL HIGH (ref 1.4–7.0)
Neutrophils: 72 %
Platelets: 289 10*3/uL (ref 150–450)
RBC: 4.21 x10E6/uL (ref 3.77–5.28)
RDW: 13.1 % (ref 11.7–15.4)
WBC: 10.4 10*3/uL (ref 3.4–10.8)

## 2019-03-13 LAB — CERVICOVAGINAL ANCILLARY ONLY
Chlamydia: NEGATIVE
Neisseria Gonorrhea: NEGATIVE
Trichomonas: POSITIVE — AB

## 2019-03-14 ENCOUNTER — Encounter: Payer: Self-pay | Admitting: Certified Nurse Midwife

## 2019-03-14 MED ORDER — METRONIDAZOLE 500 MG PO TABS: ORAL_TABLET | ORAL | 0 refills | Status: DC

## 2019-03-14 NOTE — Progress Notes (Signed)
Work in appointment for vaginal pressure and vaginal discharge x1 week. Discharge is sometimes milky and sometimes like cottage cheese. Was treated for Trichimoniasis in March. Thinks her partner took treatment.  Also complains of feeling lightheaded at times and states she has felt this way in the past when she was anemic and needed a blood transfusion. NOB H&H was normal. Exam: General: WF in NAD Vulva: no inflammation or irritation Vagina: clear to white mucoid dischanrge Wet prep positive for TNTC WBC, but unable to see hyphae, Trich, or clue cells A: Lightheadedness Vaginal discharge with TNTC WBC P: Aptima Repeat CBC Discussed some physiological changes with pregnancy which may cause lightheadedness and suggestions to counter these effects.  Farrel Conners, CNM

## 2019-03-14 NOTE — Telephone Encounter (Signed)
Spoke with patient regarding the positive Trichimoniasis and need to treat her and partner. RX for Flagyl called to pharmacy. Advised no alcohol on day of treatment and then the following 2 days post treatment. To take meds with food. Farrel Conners, CNM

## 2019-03-31 ENCOUNTER — Emergency Department: Payer: Managed Care, Other (non HMO)

## 2019-03-31 ENCOUNTER — Other Ambulatory Visit: Payer: Self-pay

## 2019-03-31 ENCOUNTER — Encounter: Payer: Self-pay | Admitting: Emergency Medicine

## 2019-03-31 ENCOUNTER — Emergency Department
Admission: EM | Admit: 2019-03-31 | Discharge: 2019-03-31 | Disposition: A | Discharge status: Home / Self Care | Payer: Managed Care, Other (non HMO) | Attending: Internal Medicine | Admitting: Internal Medicine

## 2019-03-31 DIAGNOSIS — Z3A19 19 weeks gestation of pregnancy: Secondary | ICD-10-CM | POA: Diagnosis not present

## 2019-03-31 DIAGNOSIS — R109 Unspecified abdominal pain: Secondary | ICD-10-CM | POA: Diagnosis not present

## 2019-03-31 DIAGNOSIS — N3000 Acute cystitis without hematuria: Secondary | ICD-10-CM

## 2019-03-31 DIAGNOSIS — Z87891 Personal history of nicotine dependence: Secondary | ICD-10-CM | POA: Diagnosis not present

## 2019-03-31 DIAGNOSIS — K0889 Other specified disorders of teeth and supporting structures: Secondary | ICD-10-CM | POA: Diagnosis not present

## 2019-03-31 DIAGNOSIS — O2342 Unspecified infection of urinary tract in pregnancy, second trimester: Secondary | ICD-10-CM | POA: Insufficient documentation

## 2019-03-31 DIAGNOSIS — O9989 Other specified diseases and conditions complicating pregnancy, childbirth and the puerperium: Secondary | ICD-10-CM | POA: Diagnosis present

## 2019-03-31 LAB — CBC
HCT: 34.7 % — ABNORMAL LOW (ref 36.0–46.0)
Hemoglobin: 12 g/dL (ref 12.0–15.0)
MCH: 30 pg (ref 26.0–34.0)
MCHC: 34.6 g/dL (ref 30.0–36.0)
MCV: 86.8 fL (ref 80.0–100.0)
Platelets: 341 10*3/uL (ref 150–400)
RBC: 4 MIL/uL (ref 3.87–5.11)
RDW: 13.9 % (ref 11.5–15.5)
WBC: 13 10*3/uL — ABNORMAL HIGH (ref 4.0–10.5)
nRBC: 0 % (ref 0.0–0.2)

## 2019-03-31 LAB — URINALYSIS, COMPLETE (UACMP) WITH MICROSCOPIC
Bilirubin Urine: NEGATIVE
Glucose, UA: NEGATIVE mg/dL
Hgb urine dipstick: NEGATIVE
Ketones, ur: NEGATIVE mg/dL
Nitrite: NEGATIVE
Protein, ur: 30 mg/dL — AB
Specific Gravity, Urine: 1.024 (ref 1.005–1.030)
WBC, UA: >50 WBC/hpf — ABNORMAL HIGH (ref 0–5)
pH: 6 (ref 5.0–8.0)

## 2019-03-31 LAB — COMPREHENSIVE METABOLIC PANEL
ALT: 9 U/L (ref 0–44)
AST: 12 U/L — ABNORMAL LOW (ref 15–41)
Albumin: 3.2 g/dL — ABNORMAL LOW (ref 3.5–5.0)
Alkaline Phosphatase: 80 U/L (ref 38–126)
Anion gap: 6 (ref 5–15)
BUN: 5 mg/dL — ABNORMAL LOW (ref 6–20)
CO2: 23 mmol/L (ref 22–32)
Calcium: 8.3 mg/dL — ABNORMAL LOW (ref 8.9–10.3)
Chloride: 106 mmol/L (ref 98–111)
Creatinine, Ser: 0.44 mg/dL (ref 0.44–1.00)
GFR calc Af Amer: >60 mL/min (ref >60)
GFR calc non Af Amer: >60 mL/min (ref >60)
Glucose, Bld: 92 mg/dL (ref 70–99)
Potassium: 3.4 mmol/L — ABNORMAL LOW (ref 3.5–5.1)
Sodium: 135 mmol/L (ref 135–145)
Total Bilirubin: 0.3 mg/dL (ref 0.3–1.2)
Total Protein: 6.7 g/dL (ref 6.5–8.1)

## 2019-03-31 LAB — HCG, QUANTITATIVE, PREGNANCY: hCG, Beta Chain, Quant, S: 16302 m[IU]/mL — ABNORMAL HIGH (ref <5)

## 2019-03-31 MED ORDER — NITROFURANTOIN MONOHYD MACRO 100 MG PO CAPS: 100.0000 mg | ORAL_CAPSULE | Freq: Two times a day (BID) | ORAL | 0 refills | Status: AC

## 2019-03-31 MED ORDER — SODIUM CHLORIDE 0.9 % IV SOLN: 1.0000 g | Freq: Once | INTRAVENOUS | Status: DC

## 2019-03-31 MED ORDER — ACETAMINOPHEN 500 MG PO TABS
1000.0000 mg | ORAL_TABLET | Freq: Once | ORAL | Status: AC
  Administered 2019-03-31: 1000 mg via ORAL
  Filled 2019-03-31: qty 2

## 2019-03-31 MED ORDER — NITROFURANTOIN MONOHYD MACRO 100 MG PO CAPS: 100.0000 mg | ORAL_CAPSULE | Freq: Once | ORAL | Status: DC

## 2019-03-31 MED ORDER — NITROFURANTOIN MONOHYD MACRO 100 MG PO CAPS
100.0000 mg | ORAL_CAPSULE | Freq: Once | ORAL | Status: AC
  Administered 2019-03-31: 100 mg via ORAL
  Filled 2019-03-31 (×2): qty 1

## 2019-03-31 NOTE — ED Triage Notes (Signed)
Here for tooth pain X 1 week.  Worse last night per pt.  Also c/o lower abdominal cramps and pressure. Is [redacted] weeks pregnant. G3P2, does not feel like normal pregnancy pain per pt. No vaginal bleeding. Pressure with urination. Some lower abdominal pressure when not urinating.

## 2019-03-31 NOTE — ED Notes (Signed)
Ultrasound at the bedside

## 2019-03-31 NOTE — Discharge Instructions (Addendum)
Take the antibiotic as prescribed and finish the full course.  Follow up with your ob/gyn.  Return to the ER for new, worsening or persistent pain, fever, weakness or any other new or worsening symptoms that concern you.

## 2019-03-31 NOTE — ED Provider Notes (Signed)
-----------------------------------------   8:56 PM on 03/31/2019 -----------------------------------------  I took over care on this patient from Dr. Mayford Knife.  Patient was pending an ultrasound.  It shows a live IUP with no acute abnormalities.  She is stable for discharge home with treatment for UTI.  Return precautions given and she expresses understanding.    Dionne Bucy, MD 03/31/19 2057

## 2019-03-31 NOTE — ED Provider Notes (Signed)
[facility] Emergency Department Provider Note       Time seen: ----------------------------------------- 7:38 PM on 03/31/2019 -----------------------------------------   I have reviewed the triage vital signs and the nursing notes.  HISTORY   Chief Complaint Abdominal Cramping and Dental Pain    HPI Melissa Coffey is a 23 y.o. female with a history of anemia, depression, endometriosis, pyelonephritis, sepsis who presents to the ED for tooth pain for the last week.  This got worse last night according to her.  She has had lower abdominal cramps and pressure and she was not sure if this was related to her tooth ache.  She is [redacted] weeks pregnant.  She is G3 P2, states she has cramping that does not feel like normal pregnancy.  She is not had any vaginal bleeding.  She does have pressure with urination.  She does not have any concerns about STI.  Past Medical History:  Diagnosis Date  . Anemia   . Depression   . Endometriosis   . History of blood transfusion 2017   after placental abruption  . Pyelonephritis 05/2018  . Sepsis (HCC) 05/2018   associated with pyelo  . UTI (urinary tract infection)     Patient Active Problem List   Diagnosis Date Noted  . Supervision of other normal pregnancy, antepartum 01/03/2019  . Sepsis (HCC) 06/22/2018  . Compulsive skin picking 11/30/2017  . Anxiety and depression 11/29/2017  . Previous cesarean delivery, antepartum condition or complication 05/09/2016  . Status post cesarean delivery 05/09/2016  . Anemia in pregnancy 11/11/2015    Past Surgical History:  Procedure Laterality Date  . ABDOMINAL SURGERY    . CESAREAN SECTION  02/2014   pLTCS. FITL at term  . CESAREAN SECTION N/A 05/09/2016   Procedure: CESAREAN SECTION;  Surgeon: Conard Novak, MD;  Location: [facility] ORS;  Service: Obstetrics;  Laterality: N/A;  . NECK SURGERY  2003  . TONSILLECTOMY      Allergies Patient has no known allergies.  Social  History Social History   Tobacco Use  . Smoking status: Former Smoker    Packs/day: 0.50    Years: 0.50    Pack years: 0.25    Types: Cigarettes    Last attempt to quit: 12/03/2018    Years since quitting: 0.3  . Smokeless tobacco: Never Used  . Tobacco comment: quit w/+pregnancy test  Substance Use Topics  . Alcohol use: No  . Drug use: Not Currently   Review of Systems Constitutional: Negative for fever. Cardiovascular: Negative for chest pain. Respiratory: Negative for shortness of breath. Gastrointestinal: Positive for abdominal pain Genitourinary: Positive for dysuria, negative for vaginal bleeding Musculoskeletal: Negative for back pain. Skin: Negative for rash. Neurological: Negative for headaches, focal weakness or numbness.  All systems negative/normal/unremarkable except as stated in the HPI  ____________________________________________   PHYSICAL EXAM:  VITAL SIGNS: ED Triage Vitals  Enc Vitals Group     BP 03/31/19 1830 112/71     Pulse Rate 03/31/19 1830 94     Resp 03/31/19 1830 18     Temp 03/31/19 1830 98.6 F (37 C)     Temp Source 03/31/19 1830 Oral     SpO2 03/31/19 1830 98 %     Weight 03/31/19 1830 168 lb (76.2 kg)     Height 03/31/19 1830 5\' 3"  (1.6 m)     Head Circumference --      Peak Flow --      Pain Score 03/31/19 1838  8     Pain Loc --      Pain Edu? --      Excl. in GC? --    Constitutional: Alert and oriented. Well appearing and in no distress. Eyes: Conjunctivae are normal. Normal extraocular movements. Cardiovascular: Normal rate, regular rhythm. No murmurs, rubs, or gallops. Respiratory: Normal respiratory effort without tachypnea nor retractions. Breath sounds are clear and equal bilaterally. No wheezes/rales/rhonchi. Gastrointestinal: Fundal height around 20 weeks, normal bowel sounds.  Nonfocal tenderness Musculoskeletal: Nontender with normal range of motion in extremities. No lower extremity tenderness nor  edema. Neurologic:  Normal speech and language. No gross focal neurologic deficits are appreciated.  Skin:  Skin is warm, dry and intact. No rash noted. Psychiatric: Mood and affect are normal. Speech and behavior are normal.  ____________________________________________  ED COURSE:  As part of my medical decision making, I reviewed the following data within the electronic MEDICAL RECORD NUMBER History obtained from family if available, nursing notes, old chart and ekg, as well as notes from prior ED visits. Patient presented for abdominal pain in the second trimester with dysuria, we will assess with labs and imaging as indicated at this time.   Procedures  Melissa RutherfordHarley R Coffey was evaluated in Emergency Department on 03/31/2019 for the symptoms described in the history of present illness. She was evaluated in the context of the global COVID-19 pandemic, which necessitated consideration that the patient might be at risk for infection with the SARS-CoV-2 virus that causes COVID-19. Institutional protocols and algorithms that pertain to the evaluation of patients at risk for COVID-19 are in a state of rapid change based on information released by regulatory bodies including the CDC and federal and state organizations. These policies and algorithms were followed during the patient's care in the ED.  ____________________________________________   LABS (pertinent positives/negatives)  Labs Reviewed  COMPREHENSIVE METABOLIC PANEL - Abnormal; Notable for the following components:      Result Value   Potassium 3.4 (*)    BUN 5 (*)    Calcium 8.3 (*)    Albumin 3.2 (*)    AST 12 (*)    All other components within normal limits  CBC - Abnormal; Notable for the following components:   WBC 13.0 (*)    HCT 34.7 (*)    All other components within normal limits  URINALYSIS, COMPLETE (UACMP) WITH MICROSCOPIC - Abnormal; Notable for the following components:   Color, Urine YELLOW (*)    APPearance CLOUDY (*)     Protein, ur 30 (*)    Leukocytes,Ua MODERATE (*)    WBC, UA >50 (*)    Bacteria, UA MANY (*)    All other components within normal limits  URINE CULTURE  HCG, QUANTITATIVE, PREGNANCY    RADIOLOGY  Pregnancy ultrasound Is pending at this time ____________________________________________   DIFFERENTIAL DIAGNOSIS   UTI, pyelonephritis, threatened miscarriage, twin pregnancy, dehydration, electrolyte abnormality, miscarriage  FINAL ASSESSMENT AND PLAN  UTI, abdominal pain in pregnancy   Plan: The patient had presented for abdominal pain in the second trimester. Patient's labs were indicative of urinary tract infection and she was started on Macrobid. Patient's imaging is still pending at this time   Ulice DashJohnathan E , MD    Note: This note was generated in part or whole with voice recognition software. Voice recognition is usually quite accurate but there are transcription errors that can and very often do occur. I apologize for any typographical errors that were not detected and corrected.  Emily Filbert, MD 03/31/19 [phone]

## 2019-03-31 NOTE — ED Triage Notes (Signed)
FHT 144

## 2019-04-02 LAB — URINE CULTURE
Culture: >=100000 — AB
Special Requests: NORMAL

## 2019-04-10 ENCOUNTER — Encounter: Payer: Self-pay | Admitting: Obstetrics and Gynecology

## 2019-04-10 ENCOUNTER — Other Ambulatory Visit: Payer: Self-pay

## 2019-04-10 ENCOUNTER — Ambulatory Visit (INDEPENDENT_AMBULATORY_CARE_PROVIDER_SITE_OTHER): Payer: Managed Care, Other (non HMO) | Admitting: Obstetrics and Gynecology

## 2019-04-10 ENCOUNTER — Ambulatory Visit (INDEPENDENT_AMBULATORY_CARE_PROVIDER_SITE_OTHER): Payer: Managed Care, Other (non HMO)

## 2019-04-10 VITALS — BP 104/62 | Wt 169.0 lb

## 2019-04-10 DIAGNOSIS — O34219 Maternal care for unspecified type scar from previous cesarean delivery: Secondary | ICD-10-CM

## 2019-04-10 DIAGNOSIS — Z363 Encounter for antenatal screening for malformations: Secondary | ICD-10-CM

## 2019-04-10 DIAGNOSIS — O99013 Anemia complicating pregnancy, third trimester: Secondary | ICD-10-CM

## 2019-04-10 DIAGNOSIS — Z3A20 20 weeks gestation of pregnancy: Secondary | ICD-10-CM

## 2019-04-10 DIAGNOSIS — Z3480 Encounter for supervision of other normal pregnancy, unspecified trimester: Secondary | ICD-10-CM

## 2019-04-10 DIAGNOSIS — Z3689 Encounter for other specified antenatal screening: Secondary | ICD-10-CM

## 2019-04-10 DIAGNOSIS — O99012 Anemia complicating pregnancy, second trimester: Secondary | ICD-10-CM

## 2019-04-10 NOTE — Progress Notes (Signed)
    Routine Prenatal Care Visit  Subjective  Melissa Coffey is a 23 y.o. [id number] at [redacted]w[redacted]d being seen today for ongoing prenatal care.  She is currently monitored for the following issues for this low-risk pregnancy and has Anemia in pregnancy; Previous cesarean delivery, antepartum condition or complication; Status post cesarean delivery; Anxiety and depression; Compulsive skin picking; Sepsis ([place]); and Supervision of other normal pregnancy, antepartum on their problem list.  ----------------------------------------------------------------------------------- Patient reports no complaints.   Contractions: Not present. Vag. Bleeding: None.  Movement: Present. Denies leaking of fluid.  ----------------------------------------------------------------------------------- The following portions of the patient's history were reviewed and updated as appropriate: allergies, current medications, past family history, past medical history, past social history, past surgical history and problem list. Problem list updated.   Objective  Blood pressure 104/62, weight 169 lb (76.7 kg), last menstrual period 11/19/2018, unknown if currently breastfeeding. Pregravid weight 155 lb (70.3 kg) Total Weight Gain 14 lb (6.35 kg) Urinalysis:      Fetal Status: Fetal Heart Rate (bpm): 152   Movement: Present  Presentation: Complete Breech  General:  Alert, oriented and cooperative. Patient is in no acute distress.  Skin: Skin is warm and dry. No rash noted.   Cardiovascular: Normal heart rate noted  Respiratory: Normal respiratory effort, no problems with respiration noted  Abdomen: Soft, gravid, appropriate for gestational age. Pain/Pressure: Absent     Pelvic:  Cervical exam deferred        Extremities: Normal range of motion.  Edema: None  Mental Status: Normal mood and affect. Normal behavior. Normal judgment and thought content.     Assessment   23 y.o. [id number] at [redacted]w[redacted]d by  08/26/2019, by Last Menstrual  Period presenting for routine prenatal visit  Plan   Pregnancy #4 Problems (from 01/03/19 to present)    Problem Noted Resolved   Supervision of other normal pregnancy, antepartum 01/03/2019 by Rod Can, CNM No   Overview Addendum 04/10/2019 11:52 AM by Homero Fellers, [place]  Dating  LMP=7wk [place] Blood type: O/Positive/-- (03/06 1453)   Genetic Screen  declines all Antibody:Negative (03/06 1453)  Anatomic [place]  incomplete for spin Rubella: <0.90 NONIMMUNE  Varicella: IMMUNE  GTT  Third trimester:  RPR: Non Reactive (03/06 1453)   Rhogam  not needed HBsAg: Negative (03/06 1453)   TDaP vaccine                       Flu Shot: HIV: Non Reactive (03/06 1453)   Baby Food                                GBS:   Contraception  Pap: LSIL 2019  CBB     CS/VBAC C/s x2 2015 FITL, 2017 placental abruption   Support Person Partner Merrilee Seashore          Previous cesarean delivery, antepartum condition or complication 3/81/0175 by Will Bonnet, MD No       Gestational age appropriate obstetric precautions including but not limited to vaginal bleeding, contractions, leaking of fluid and fetal movement were reviewed in detail with the patient.    Patient refuses test of cure for trichamoniasis  Return in about 4 weeks (around 05/08/2019) for [place] in person and [place].  Homero Fellers MD [facility], [facility] 04/10/2019, 11:58 AM

## 2019-04-10 NOTE — Progress Notes (Signed)
ROB/US C/o pain in belly button  Denies lof, no vb, Good FM

## 2019-05-08 ENCOUNTER — Ambulatory Visit (INDEPENDENT_AMBULATORY_CARE_PROVIDER_SITE_OTHER): Payer: Managed Care, Other (non HMO) | Admitting: Certified Nurse Midwife

## 2019-05-08 ENCOUNTER — Other Ambulatory Visit: Payer: Self-pay

## 2019-05-08 ENCOUNTER — Ambulatory Visit (INDEPENDENT_AMBULATORY_CARE_PROVIDER_SITE_OTHER): Payer: Managed Care, Other (non HMO)

## 2019-05-08 VITALS — BP 100/60 | Wt 176.0 lb

## 2019-05-08 DIAGNOSIS — Z131 Encounter for screening for diabetes mellitus: Secondary | ICD-10-CM

## 2019-05-08 DIAGNOSIS — Z3480 Encounter for supervision of other normal pregnancy, unspecified trimester: Secondary | ICD-10-CM

## 2019-05-08 DIAGNOSIS — Z3A24 24 weeks gestation of pregnancy: Secondary | ICD-10-CM

## 2019-05-08 DIAGNOSIS — Z362 Encounter for other antenatal screening follow-up: Secondary | ICD-10-CM | POA: Diagnosis not present

## 2019-05-08 DIAGNOSIS — O34219 Maternal care for unspecified type scar from previous cesarean delivery: Secondary | ICD-10-CM

## 2019-05-08 DIAGNOSIS — Z113 Encounter for screening for infections with a predominantly sexual mode of transmission: Secondary | ICD-10-CM

## 2019-05-08 LAB — POCT URINALYSIS DIPSTICK OB
Glucose, UA: NEGATIVE
POC,PROTEIN,UA: NEGATIVE

## 2019-05-10 NOTE — Progress Notes (Signed)
ROB and FU anatomy scan at 24wk2days: Good FM. Anatomy scan complete (spine seen) PLanning repeat Cesarean section and BTL Counseled on permanence of BTL as well as failure rate of 1/300 and increased risk of tubal pregnancy if she does conceive. Also explained risks of bleeding with procedure. She signed her 30 day papers today Wants to bottle feed-desires repeat CS 28 week labs and ROB in 4 weeks.  Dalia Heading, CNM

## 2019-06-05 ENCOUNTER — Encounter: Payer: Managed Care, Other (non HMO) | Admitting: Obstetrics and Gynecology

## 2019-06-05 ENCOUNTER — Other Ambulatory Visit: Payer: Managed Care, Other (non HMO)

## 2019-06-11 ENCOUNTER — Ambulatory Visit (INDEPENDENT_AMBULATORY_CARE_PROVIDER_SITE_OTHER): Payer: Managed Care, Other (non HMO) | Admitting: Obstetrics and Gynecology

## 2019-06-11 ENCOUNTER — Encounter: Payer: Self-pay | Admitting: Obstetrics and Gynecology

## 2019-06-11 ENCOUNTER — Other Ambulatory Visit: Payer: Self-pay

## 2019-06-11 ENCOUNTER — Other Ambulatory Visit: Payer: Managed Care, Other (non HMO)

## 2019-06-11 VITALS — BP 104/74 | Wt 180.0 lb

## 2019-06-11 DIAGNOSIS — Z3480 Encounter for supervision of other normal pregnancy, unspecified trimester: Secondary | ICD-10-CM

## 2019-06-11 DIAGNOSIS — Z113 Encounter for screening for infections with a predominantly sexual mode of transmission: Secondary | ICD-10-CM

## 2019-06-11 DIAGNOSIS — O34219 Maternal care for unspecified type scar from previous cesarean delivery: Secondary | ICD-10-CM

## 2019-06-11 DIAGNOSIS — Z3A29 29 weeks gestation of pregnancy: Secondary | ICD-10-CM

## 2019-06-11 DIAGNOSIS — Z131 Encounter for screening for diabetes mellitus: Secondary | ICD-10-CM

## 2019-06-11 NOTE — Progress Notes (Signed)
No vb. No lof. 28 week labs today.  

## 2019-06-11 NOTE — Progress Notes (Signed)
Routine Prenatal Care Visit  Subjective  Melissa Coffey is a 23 y.o. [id number] at [redacted]w[redacted]d being seen today for ongoing prenatal care.  She is currently monitored for the following issues for this low-risk pregnancy and has Anemia in pregnancy; Previous cesarean delivery, antepartum condition or complication; Status post cesarean delivery; Anxiety and depression; Compulsive skin picking; Sepsis ([place]); and Supervision of other normal pregnancy, antepartum on their problem list.  ----------------------------------------------------------------------------------- Patient reports no complaints.   Contractions: Not present. Vag. Bleeding: None.  Movement: Present. Denies leaking of fluid.  ----------------------------------------------------------------------------------- The following portions of the patient's history were reviewed and updated as appropriate: allergies, current medications, past family history, past medical history, past social history, past surgical history and problem list. Problem list updated.   Objective  Blood pressure 104/74, weight 180 lb (81.6 kg), last menstrual period 11/19/2018, unknown if currently breastfeeding. Pregravid weight 155 lb (70.3 kg) Total Weight Gain 25 lb (11.3 kg) Urinalysis:      Fetal Status: Fetal Heart Rate (bpm): 140 Fundal Height: 30 cm Movement: Present     General:  Alert, oriented and cooperative. Patient is in no acute distress.  Skin: Skin is warm and dry. No rash noted.   Cardiovascular: Normal heart rate noted  Respiratory: Normal respiratory effort, no problems with respiration noted  Abdomen: Soft, gravid, appropriate for gestational age. Pain/Pressure: Absent     Pelvic:  Cervical exam deferred        Extremities: Normal range of motion.  Edema: None  Mental Status: Normal mood and affect. Normal behavior. Normal judgment and thought content.     Assessment   23 y.o. [id number] at [redacted]w[redacted]d by  08/26/2019, by Last Menstrual Period  presenting for routine prenatal visit  Plan   Pregnancy #4 Problems (from 01/03/19 to present)    Problem Noted Resolved   Supervision of other normal pregnancy, antepartum 01/03/2019 by Rod Can, CNM No   Overview Addendum 05/10/2019  2:44 PM by Dalia Heading, [place] Prenatal Labs  Dating  LMP=7wk [place] Blood type: O/Positive/-- (03/06 1453)   Genetic Screen  declines all Antibody:Negative (03/06 1453)  Anatomic [place]  incomplete for spine. FU anatomy scan-completed/ normal Rubella: <0.90 NONIMMUNE  Varicella: IMMUNE  GTT  Third trimester:  RPR: Non Reactive (03/06 1453)   Rhogam  not needed HBsAg: Negative (03/06 1453)   TDaP vaccine                       Flu Shot: HIV: Non Reactive (03/06 1453)   Baby Food       Bottle                         GBS:   Contraception BTL Pap: LSIL 2019  CBB     CS/VBAC C/s x2 2015 FITL, 2017 placental abruption   Support Person Partner Merrilee Seashore          Previous cesarean delivery, antepartum condition or complication 6/44/0347 by Will Bonnet, MD No       Gestational age appropriate obstetric precautions including but not limited to vaginal bleeding, contractions, leaking of fluid and fetal movement were reviewed in detail with the patient.    Declines TOC for trichomonas Cesarean section scheduled with jackson for 08/19/2019 BTL consents signed 1GTT today  Return in about 2 weeks (around 06/25/2019) for ROB in person.  Homero Fellers MD [facility], [place]  06/11/2019, 10:31 AM

## 2019-06-12 LAB — 28 WEEK RH+PANEL
Basophils Absolute: 0.1 10*3/uL (ref 0.0–0.2)
Basos: 0 %
EOS (ABSOLUTE): 0.1 10*3/uL (ref 0.0–0.4)
Eos: 0 %
Gestational Diabetes Screen: 117 mg/dL (ref 65–139)
HIV Screen 4th Generation wRfx: NONREACTIVE
Hematocrit: 32.6 % — ABNORMAL LOW (ref 34.0–46.6)
Hemoglobin: 11.2 g/dL (ref 11.1–15.9)
Immature Grans (Abs): 0.1 10*3/uL (ref 0.0–0.1)
Immature Granulocytes: 1 %
Lymphocytes Absolute: 2.1 10*3/uL (ref 0.7–3.1)
Lymphs: 16 %
MCH: 28.5 pg (ref 26.6–33.0)
MCHC: 34.4 g/dL (ref 31.5–35.7)
MCV: 83 fL (ref 79–97)
Monocytes Absolute: 0.5 10*3/uL (ref 0.1–0.9)
Monocytes: 4 %
Neutrophils Absolute: 10.5 10*3/uL — ABNORMAL HIGH (ref 1.4–7.0)
Neutrophils: 79 %
Platelets: 290 10*3/uL (ref 150–450)
RBC: 3.93 x10E6/uL (ref 3.77–5.28)
RDW: 12.1 % (ref 11.7–15.4)
RPR Ser Ql: NONREACTIVE
WBC: 13.2 10*3/uL — ABNORMAL HIGH (ref 3.4–10.8)

## 2019-06-13 ENCOUNTER — Telehealth: Payer: Self-pay | Admitting: Obstetrics and Gynecology

## 2019-06-13 NOTE — Telephone Encounter (Signed)
Lmtrc

## 2019-06-13 NOTE — Telephone Encounter (Signed)
-----   Message from Homero Fellers, MD sent at 06/11/2019 10:28 AM EDT ----- Surgery Booking Request Patient Full Name:  Melissa Coffey  MRN: [id number]  DOB: [birth year]  Surgeon: Prentice Docker, MD  Requested Surgery Date and Time: 08/19/2019 Primary Diagnosis AND Code: Supervision of normal pregnancy, hx of prior LTCS Secondary Diagnosis and Code:  Surgical Procedure: Cesarean Section L&D Notification: Yes Admission Status: same day surgery Length of Surgery: 1 hour Special Case Needs: nond H&P: TBD (date) Phone Interview???: yes Interpreter: Language:  Medical Clearance: no Special Scheduling Instructions: none Acuity: P3   Surgery with Dr. Glennon Mac at patient request

## 2019-06-19 NOTE — Telephone Encounter (Signed)
Lmtrc

## 2019-06-22 IMAGING — CT CT RENAL STONE PROTOCOL
3 of 4 series · 8 of 46 positions shown, 15 images · non-contrast
Comparison: None.

CLINICAL DATA: 22-year-old with left lower abdominal and flank pain
for 3 days.

EXAM:
CT ABDOMEN AND PELVIS WITHOUT CONTRAST
TECHNIQUE: Multidetector CT imaging of the abdomen and pelvis was performed
following the standard protocol without IV contrast.

[Series 4: lung bases · axial · 0.59mm/px · z∈[-322,-257]mm · 4 of 23 slices shown, 9 images]
[im 5/23  soft-tissue]
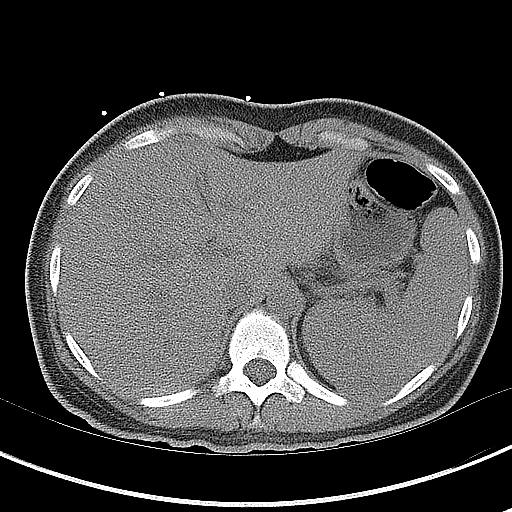
[im 5/23  lung]
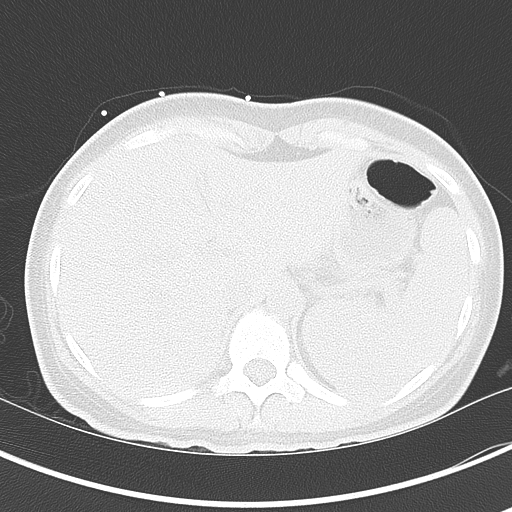
[im 5/23  bone]
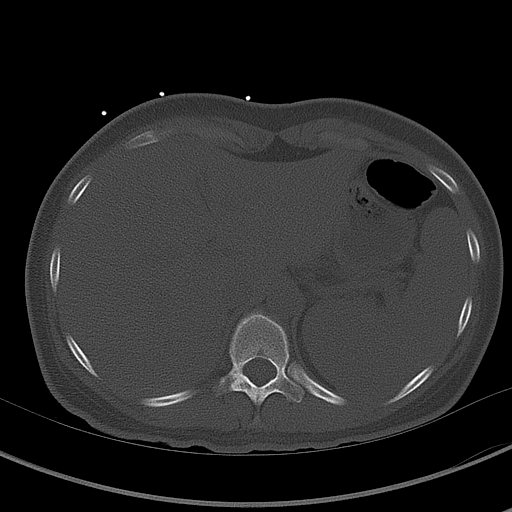
[im 9/23  soft-tissue]
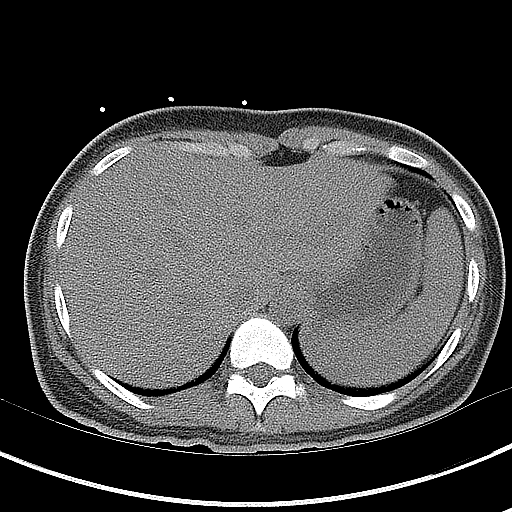
[im 9/23  lung]
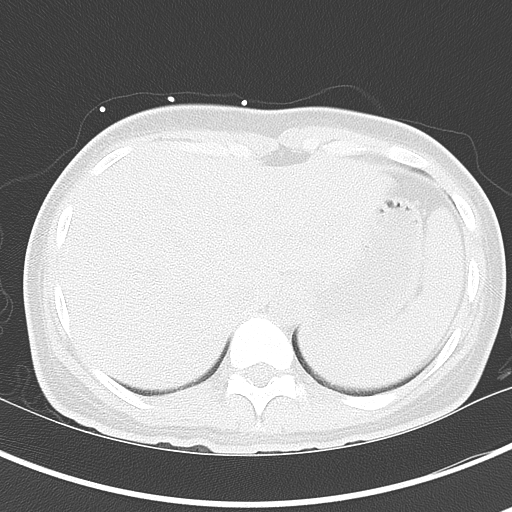
[im 14/23  soft-tissue]
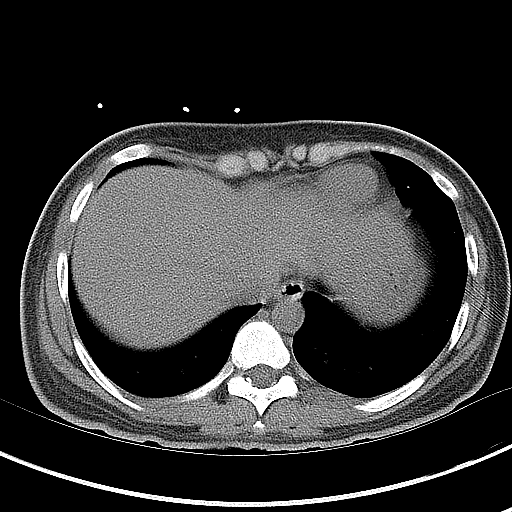
[im 14/23  lung]
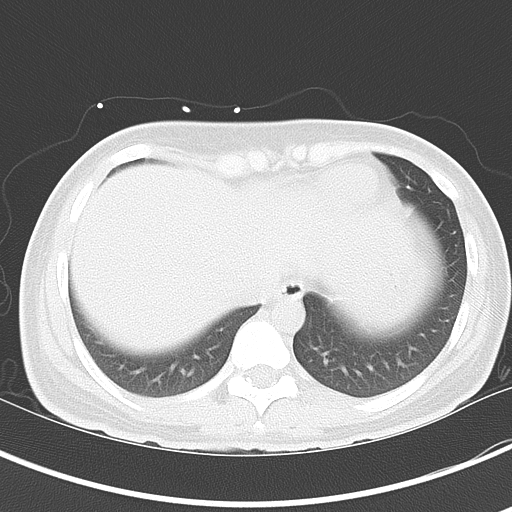
[im 18/23  soft-tissue]
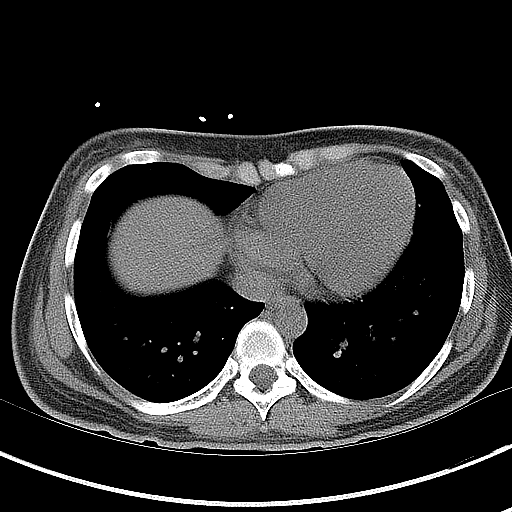
[im 18/23  lung]
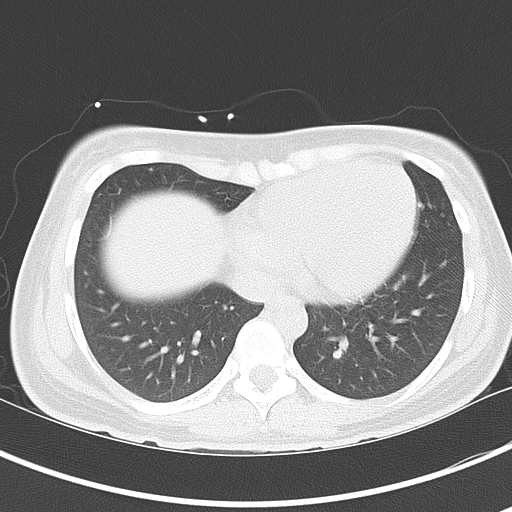

[Series 5: coronal · coronal · 0.64mm/px · 3 of 96 slices shown, 4 images]
[im 32/96  soft-tissue]
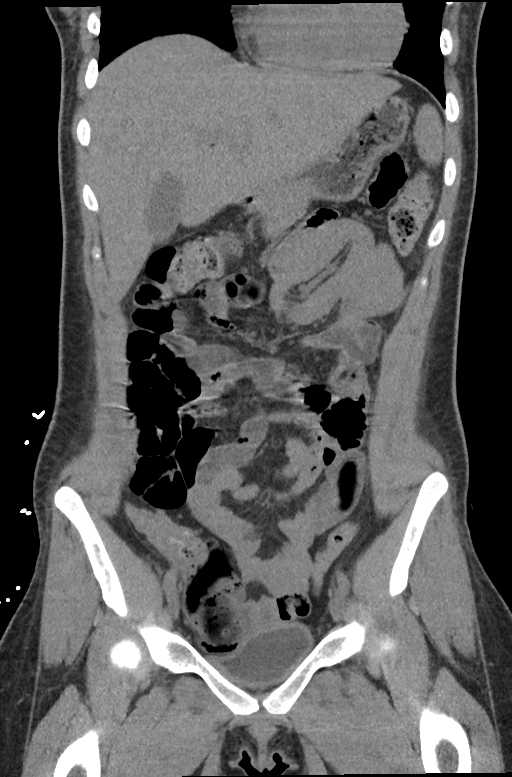
[im 43/96  soft-tissue]
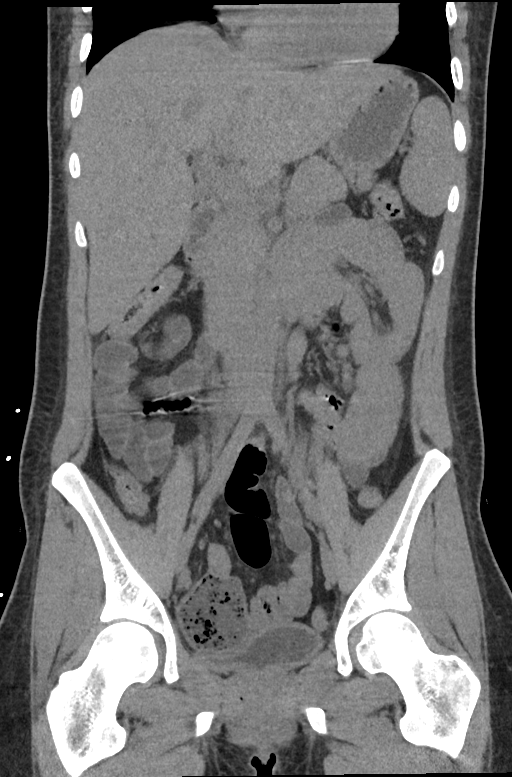
[im 43/96  bone]
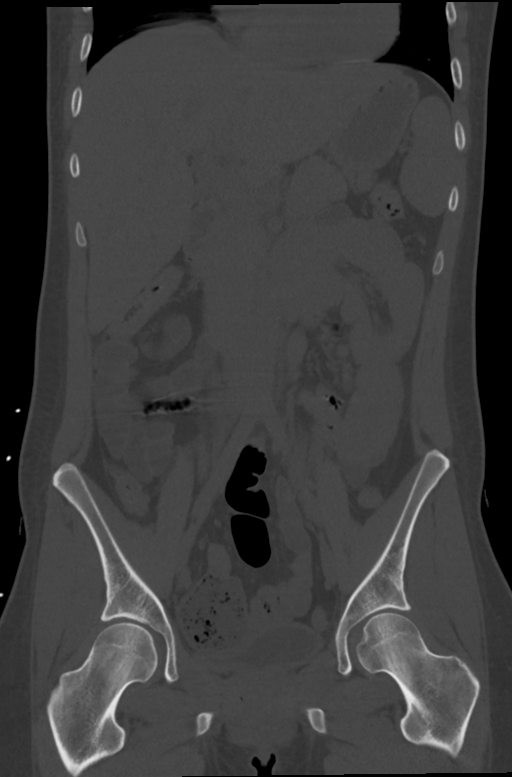
[im 53/96  soft-tissue]
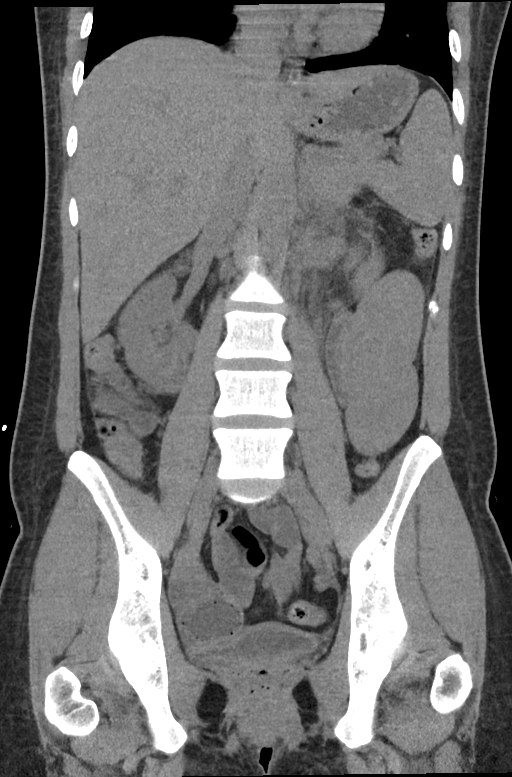

[Series 6: sagittal · sagittal · 0.53mm/px · 1 of 141 slices shown, 2 images]
[im 47/141  soft-tissue]
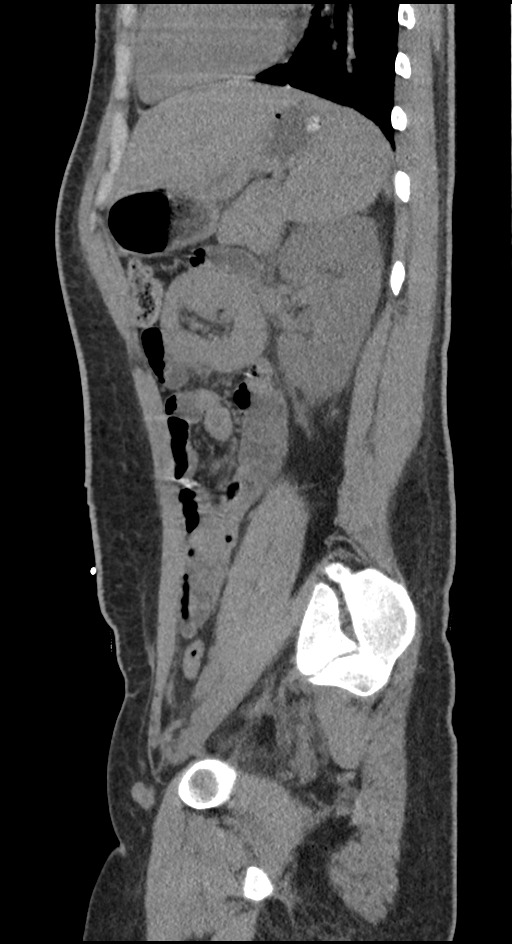
[im 47/141  bone]
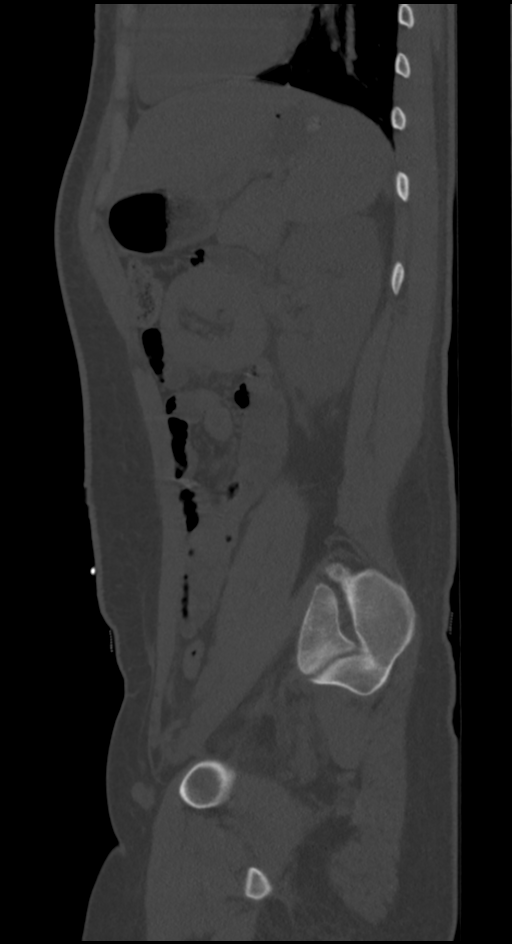

[8 of 46 positions shown; findings below may reference images not displayed]

FINDINGS: Lower chest: Lung bases are clear.

Hepatobiliary: Normal appearance of the liver and gallbladder.

Pancreas: Unremarkable. No pancreatic ductal dilatation or
surrounding inflammatory changes.

Spleen: Spleen is mildly prominent for size but no gross
abnormality.

Adrenals/Urinary Tract: Adrenal glands are poorly characterized on
this noncontrast examination. Normal appearance of the right kidney
without hydronephrosis or stones. Difficult to exclude mild urinary
bladder wall thickening. Inflammatory changes around the left kidney
without significant hydronephrosis. No evidence for kidney stones.
No evidence for a left ureter stone.

Stomach/Bowel: There is high-density material within the appendix,
possibly representing an appendicolith. No evidence for acute
appendix inflammation. Unusual appearance of small bowel loops in
the left abdomen. This is best seen on sequence 2, image 45.
Findings raise concern for marked wall thickening in this area and
cannot exclude a small bowel intussusception in this area. No gross
abnormality to the stomach or duodenum. No evidence for a bowel
obstruction.

Vascular/Lymphatic: Limited evaluation of the vascular structures on
this noncontrast examination. There are small lymph nodes in the
inguinal regions. Stranding and mildly prominent left periaortic
lymph nodes which could be reactive from the changes in left kidney.

Reproductive: Uterus and bilateral adnexa are unremarkable.

Other: Small amount of free fluid in the pelvis. Negative for free
air.

Musculoskeletal: No acute abnormality.
IMPRESSION: Inflammatory changes involving the left kidney without stones or
significant hydronephrosis. Findings are suggestive for left
pyelonephritis. Cannot exclude mild bladder wall thickening.

Abnormal appearance of small bowel loops in the left abdomen. Cannot
exclude significant bowel wall thickening and/or intussusception in
this area. Recommend a follow-up CT of the abdomen and pelvis with
IV and oral contrast to better characterize the small bowel and the
left kidney.

Small amount of free fluid in the pelvis which could be related to
inflammatory changes in the abdomen or physiologic.

These results were called by telephone at the time of interpretation
on 06/22/2018 at [DATE] to Dr. MUHAMMAD MURAD ISHAQ , who verbally
acknowledged these results.

## 2019-06-25 ENCOUNTER — Ambulatory Visit (INDEPENDENT_AMBULATORY_CARE_PROVIDER_SITE_OTHER): Payer: Managed Care, Other (non HMO) | Admitting: Advanced Practice Midwife

## 2019-06-25 ENCOUNTER — Encounter: Payer: Self-pay | Admitting: Advanced Practice Midwife

## 2019-06-25 ENCOUNTER — Other Ambulatory Visit (HOSPITAL_COMMUNITY)
Admission: RE | Admit: 2019-06-25 | Discharge: 2019-06-25 | Disposition: A | Discharge status: Home / Self Care | Payer: Managed Care, Other (non HMO) | Source: Ambulatory Visit | Attending: Advanced Practice Midwife | Admitting: Advanced Practice Midwife

## 2019-06-25 ENCOUNTER — Other Ambulatory Visit: Payer: Self-pay

## 2019-06-25 VITALS — BP 96/70 | Wt 183.6 lb

## 2019-06-25 DIAGNOSIS — Z3A31 31 weeks gestation of pregnancy: Secondary | ICD-10-CM

## 2019-06-25 DIAGNOSIS — F418 Other specified anxiety disorders: Secondary | ICD-10-CM

## 2019-06-25 DIAGNOSIS — O99343 Other mental disorders complicating pregnancy, third trimester: Secondary | ICD-10-CM

## 2019-06-25 DIAGNOSIS — R102 Pelvic and perineal pain: Secondary | ICD-10-CM

## 2019-06-25 DIAGNOSIS — Z3480 Encounter for supervision of other normal pregnancy, unspecified trimester: Secondary | ICD-10-CM

## 2019-06-25 DIAGNOSIS — Z113 Encounter for screening for infections with a predominantly sexual mode of transmission: Secondary | ICD-10-CM

## 2019-06-25 DIAGNOSIS — O26893 Other specified pregnancy related conditions, third trimester: Secondary | ICD-10-CM

## 2019-06-25 LAB — POCT URINALYSIS DIPSTICK
Bilirubin, UA: NEGATIVE
Blood, UA: NEGATIVE
Glucose, UA: NEGATIVE
Ketones, UA: NEGATIVE
Nitrite, UA: POSITIVE
Protein, UA: POSITIVE — AB
Spec Grav, UA: 1.015 (ref 1.010–1.025)
Urobilinogen, UA: NEGATIVE E.U./dL — AB
pH, UA: 6 (ref 5.0–8.0)

## 2019-06-25 LAB — POCT URINALYSIS DIPSTICK OB: Glucose, UA: NEGATIVE

## 2019-06-25 NOTE — Patient Instructions (Signed)
Third Trimester of Pregnancy The third trimester is from week 28 through week 40 (months 7 through 9). The third trimester is a time when the unborn baby (fetus) is growing rapidly. At the end of the ninth month, the fetus is about 20 inches in length and weighs 6-10 pounds. Body changes during your third trimester Your body will continue to go through many changes during pregnancy. The changes vary from woman to woman. During the third trimester:  Your weight will continue to increase. You can expect to gain 25-35 pounds (11-16 kg) by the end of the pregnancy.  You may begin to get stretch marks on your hips, abdomen, and breasts.  You may urinate more often because the fetus is moving lower into your pelvis and pressing on your bladder.  You may develop or continue to have heartburn. This is caused by increased hormones that slow down muscles in the digestive tract.  You may develop or continue to have constipation because increased hormones slow digestion and cause the muscles that push waste through your intestines to relax.  You may develop hemorrhoids. These are swollen veins (varicose veins) in the rectum that can itch or be painful.  You may develop swollen, bulging veins (varicose veins) in your legs.  You may have increased body aches in the pelvis, back, or thighs. This is due to weight gain and increased hormones that are relaxing your joints.  You may have changes in your hair. These can include thickening of your hair, rapid growth, and changes in texture. Some women also have hair loss during or after pregnancy, or hair that feels dry or thin. Your hair will most likely return to normal after your baby is born.  Your breasts will continue to grow and they will continue to become tender. A yellow fluid (colostrum) may leak from your breasts. This is the first milk you are producing for your baby.  Your belly button may stick out.  You may notice more swelling in your hands,  face, or ankles.  You may have increased tingling or numbness in your hands, arms, and legs. The skin on your belly may also feel numb.  You may feel short of breath because of your expanding uterus.  You may have more problems sleeping. This can be caused by the size of your belly, increased need to urinate, and an increase in your body's metabolism.  You may notice the fetus "dropping," or moving lower in your abdomen (lightening).  You may have increased vaginal discharge.  You may notice your joints feel loose and you may have pain around your pelvic bone. What to expect at prenatal visits You will have prenatal exams every 2 weeks until week 36. Then you will have weekly prenatal exams. During a routine prenatal visit:  You will be weighed to make sure you and the baby are growing normally.  Your blood pressure will be taken.  Your abdomen will be measured to track your baby's growth.  The fetal heartbeat will be listened to.  Any test results from the previous visit will be discussed.  You may have a cervical check near your due date to see if your cervix has softened or thinned (effaced).  You will be tested for Group B streptococcus. This happens between 35 and 37 weeks. Your health care provider may ask you:  What your birth plan is.  How you are feeling.  If you are feeling the baby move.  If you have had any abnormal   symptoms, such as leaking fluid, bleeding, severe headaches, or abdominal cramping.  If you are using any tobacco products, including cigarettes, chewing tobacco, and electronic cigarettes.  If you have any questions. Other tests or screenings that may be performed during your third trimester include:  Blood tests that check for low iron levels (anemia).  Fetal testing to check the health, activity level, and growth of the fetus. Testing is done if you have certain medical conditions or if there are problems during the pregnancy.  Nonstress test  (NST). This test checks the health of your baby to make sure there are no signs of problems, such as the baby not getting enough oxygen. During this test, a belt is placed around your belly. The baby is made to move, and its heart rate is monitored during movement. What is false labor? False labor is a condition in which you feel small, irregular tightenings of the muscles in the womb (contractions) that usually go away with rest, changing position, or drinking water. These are called Braxton Hicks contractions. Contractions may last for hours, days, or even weeks before true labor sets in. If contractions come at regular intervals, become more frequent, increase in intensity, or become painful, you should see your health care provider. What are the signs of labor?  Abdominal cramps.  Regular contractions that start at 10 minutes apart and become stronger and more frequent with time.  Contractions that start on the top of the uterus and spread down to the lower abdomen and back.  Increased pelvic pressure and dull back pain.  A watery or bloody mucus discharge that comes from the vagina.  Leaking of amniotic fluid. This is also known as your "water breaking." It could be a slow trickle or a gush. Let your health care provider know if it has a color or strange odor. If you have any of these signs, call your health care provider right away, even if it is before your due date. Follow these instructions at home: Medicines  Follow your health care provider's instructions regarding medicine use. Specific medicines may be either safe or unsafe to take during pregnancy.  Take a prenatal vitamin that contains at least 600 micrograms (mcg) of folic acid.  If you develop constipation, try taking a stool softener if your health care provider approves. Eating and drinking   Eat a balanced diet that includes fresh fruits and vegetables, whole grains, good sources of protein such as meat, eggs, or tofu,  and low-fat dairy. Your health care provider will help you determine the amount of weight gain that is right for you.  Avoid raw meat and uncooked cheese. These carry germs that can cause birth defects in the baby.  If you have low calcium intake from food, talk to your health care provider about whether you should take a daily calcium supplement.  Eat four or five small meals rather than three large meals a day.  Limit foods that are high in fat and processed sugars, such as fried and sweet foods.  To prevent constipation: ? Drink enough fluid to keep your urine clear or pale yellow. ? Eat foods that are high in fiber, such as fresh fruits and vegetables, whole grains, and beans. Activity  Exercise only as directed by your health care provider. Most women can continue their usual exercise routine during pregnancy. Try to exercise for 30 minutes at least 5 days a week. Stop exercising if you experience uterine contractions.  Avoid heavy lifting.  Do   not exercise in extreme heat or humidity, or at high altitudes.  Wear low-heel, comfortable shoes.  Practice good posture.  You may continue to have sex unless your health care provider tells you otherwise. Relieving pain and discomfort  Take frequent breaks and rest with your legs elevated if you have leg cramps or low back pain.  Take warm sitz baths to soothe any pain or discomfort caused by hemorrhoids. Use hemorrhoid cream if your health care provider approves.  Wear a good support bra to prevent discomfort from breast tenderness.  If you develop varicose veins: ? Wear support pantyhose or compression stockings as told by your healthcare provider. ? Elevate your feet for 15 minutes, 3-4 times a day. Prenatal care  Write down your questions. Take them to your prenatal visits.  Keep all your prenatal visits as told by your health care provider. This is important. Safety  Wear your seat belt at all times when driving.  Make  a list of emergency phone numbers, including numbers for family, friends, the hospital, and police and fire departments. General instructions  Avoid cat litter boxes and soil used by cats. These carry germs that can cause birth defects in the baby. If you have a cat, ask someone to clean the litter box for you.  Do not travel far distances unless it is absolutely necessary and only with the approval of your health care provider.  Do not use hot tubs, steam rooms, or saunas.  Do not drink alcohol.  Do not use any products that contain nicotine or tobacco, such as cigarettes and e-cigarettes. If you need help quitting, ask your health care provider.  Do not use any medicinal herbs or unprescribed drugs. These chemicals affect the formation and growth of the baby.  Do not douche or use tampons or scented sanitary pads.  Do not cross your legs for long periods of time.  To prepare for the arrival of your baby: ? Take prenatal classes to understand, practice, and ask questions about labor and delivery. ? Make a trial run to the hospital. ? Visit the hospital and tour the maternity area. ? Arrange for maternity or paternity leave through employers. ? Arrange for family and friends to take care of pets while you are in the hospital. ? Purchase a rear-facing car seat and make sure you know how to install it in your car. ? Pack your hospital bag. ? Prepare the baby's nursery. Make sure to remove all pillows and stuffed animals from the baby's crib to prevent suffocation.  Visit your dentist if you have not gone during your pregnancy. Use a soft toothbrush to brush your teeth and be gentle when you floss. Contact a health care provider if:  You are unsure if you are in labor or if your water has broken.  You become dizzy.  You have mild pelvic cramps, pelvic pressure, or nagging pain in your abdominal area.  You have lower back pain.  You have persistent nausea, vomiting, or diarrhea.   You have an unusual or bad smelling vaginal discharge.  You have pain when you urinate. Get help right away if:  Your water breaks before 37 weeks.  You have regular contractions less than 5 minutes apart before 37 weeks.  You have a fever.  You are leaking fluid from your vagina.  You have spotting or bleeding from your vagina.  You have severe abdominal pain or cramping.  You have rapid weight loss or weight gain.  You have   shortness of breath with chest pain.  You notice sudden or extreme swelling of your face, hands, ankles, feet, or legs.  Your baby makes fewer than 10 movements in 2 hours.  You have severe headaches that do not go away when you take medicine.  You have vision changes. Summary  The third trimester is from week 28 through week 40, months 7 through 9. The third trimester is a time when the unborn baby (fetus) is growing rapidly.  During the third trimester, your discomfort may increase as you and your baby continue to gain weight. You may have abdominal, leg, and back pain, sleeping problems, and an increased need to urinate.  During the third trimester your breasts will keep growing and they will continue to become tender. A yellow fluid (colostrum) may leak from your breasts. This is the first milk you are producing for your baby.  False labor is a condition in which you feel small, irregular tightenings of the muscles in the womb (contractions) that eventually go away. These are called Braxton Hicks contractions. Contractions may last for hours, days, or even weeks before true labor sets in.  Signs of labor can include: abdominal cramps; regular contractions that start at 10 minutes apart and become stronger and more frequent with time; watery or bloody mucus discharge that comes from the vagina; increased pelvic pressure and dull back pain; and leaking of amniotic fluid. This information is not intended to replace advice given to you by your health  care provider. Make sure you discuss any questions you have with your health care provider. Document Released: 10/10/2001 Document Revised: 02/06/2019 Document Reviewed: 11/21/2016 Elsevier Patient Education  2020 [employer]  

## 2019-06-25 NOTE — Progress Notes (Signed)
Routine Prenatal Care Visit  Subjective  Melissa Coffey is a 23 y.o. [id number] at [redacted]w[redacted]d being seen today for ongoing prenatal care.  She is currently monitored for the following issues for this high-risk pregnancy and has Anemia in pregnancy; Previous cesarean delivery, antepartum condition or complication; Status post cesarean delivery; Anxiety and depression; Compulsive skin picking; Sepsis ([place]); and Supervision of other normal pregnancy, antepartum on their problem list.  ----------------------------------------------------------------------------------- Patient reports intermittent pain in her abdomen and low back for the past 3 days. She has also had nausea and decreased appetite. The pain is not regular and lasts for a few minutes when she feels it. She denies dysuria or frequency.   Contractions: Not present. Vag. Bleeding: None.  Movement: Present. Leaking Fluid denies.  ----------------------------------------------------------------------------------- The following portions of the patient's history were reviewed and updated as appropriate: allergies, current medications, past family history, past medical history, past social history, past surgical history and problem list. Problem list updated.  Objective  Blood pressure 96/70, weight 183 lb 9.6 oz (83.3 kg), last menstrual period 11/19/2018 Pregravid weight 155 lb (70.3 kg) Total Weight Gain 28 lb 9.6 oz (13 kg) Urinalysis: Urine Protein Trace  Urine Glucose Negative  Results for Melissa, Coffey (MRN [id number]) as of 06/25/2019 09:57  Ref. Range 06/25/2019 09:45  Bilirubin, UA Unknown neg  Glucose Latest Ref Range: Negative  Negative  Ketones, UA Unknown neg  Leukocytes,UA Latest Ref Range: Negative  Small (1+) (A)  Nitrite, UA Unknown positive  pH, UA Latest Ref Range: 5.0 - 8.0  6.0  Protein,UA Latest Ref Range: Negative  Positive (A)  Specific Gravity, UA Latest Ref Range: 1.010 - 1.025  1.015  Urobilinogen, UA Latest Ref Range:  0.2 or 1.0 E.U./dL negative (A)  RBC, UA Unknown neg    Fetal Status: Fetal Heart Rate (bpm): 135 Fundal Height: 33 cm Movement: Present     General:  Alert, oriented and cooperative. Patient is in no acute distress.  Skin: Skin is warm and dry. No rash noted.   Cardiovascular: Normal heart rate noted  Respiratory: Normal respiratory effort, no problems with respiration noted  Abdomen: Soft, gravid, appropriate for gestational age. Pain/Pressure: Present     Pelvic:  Cervical exam deferred        Extremities: Normal range of motion.     Mental Status: Normal mood and affect. Normal behavior. Normal judgment and thought content.   Assessment   23 y.o. [id number] at [redacted]w[redacted]d by  08/26/2019, by Last Menstrual Period presenting for routine prenatal visit  Plan   Pregnancy #4 Problems (from 01/03/19 to present)    Problem Noted Resolved   Supervision of other normal pregnancy, antepartum 01/03/2019 by Rod Can, CNM No   Overview Addendum 05/10/2019  2:44 PM by Dalia Heading, [place] Prenatal Labs  Dating  LMP=7wk [place] Blood type: O/Positive/-- (03/06 1453)   Genetic Screen  declines all Antibody:Negative (03/06 1453)  Anatomic [place]  incomplete for spine. FU anatomy scan-completed/ normal Rubella: <0.90 NONIMMUNE  Varicella: IMMUNE  GTT  Third trimester:  RPR: Non Reactive (03/06 1453)   Rhogam  not needed HBsAg: Negative (03/06 1453)   TDaP vaccine                       Flu Shot: HIV: Non Reactive (03/06 1453)   Baby Food       Bottle  GBS:   Contraception BTL Pap: LSIL 2019  CBB     CS/VBAC C/s x2 2015 FITL, 2017 placental abruption   Support Person Partner Weston Brassick          Previous cesarean delivery, antepartum condition or complication 05/09/2016 by Conard NovakJackson, Stephen D, MD No       Preterm labor symptoms and general obstetric precautions including but not limited to vaginal bleeding, contractions, leaking of fluid and fetal movement were  reviewed in detail with the patient. Please refer to After Visit Summary for other counseling recommendations.   Urine Aptima- TOC for Trichomonal infection in May Urine Culture  Return in about 2 weeks (around 07/09/2019) for rob.  Tresea MallJane Rajveer Handler, CNM 06/25/2019 9:53 AM

## 2019-06-25 NOTE — Progress Notes (Signed)
C/o really low back pain; pain and pressure in abd and down there.rj

## 2019-06-26 LAB — CERVICOVAGINAL ANCILLARY ONLY
Chlamydia: NEGATIVE
Neisseria Gonorrhea: NEGATIVE
Trichomonas: POSITIVE — AB

## 2019-06-27 ENCOUNTER — Other Ambulatory Visit: Payer: Self-pay | Admitting: Advanced Practice Midwife

## 2019-06-27 DIAGNOSIS — O2340 Unspecified infection of urinary tract in pregnancy, unspecified trimester: Secondary | ICD-10-CM

## 2019-06-27 LAB — URINE CULTURE

## 2019-06-27 MED ORDER — CEPHALEXIN 500 MG PO CAPS: 500.0000 mg | ORAL_CAPSULE | Freq: Four times a day (QID) | ORAL | 2 refills | Status: DC

## 2019-06-27 NOTE — Progress Notes (Unsigned)
Rx Keflex sent to [facility]. Voicemail and MyChart messages left for patient.

## 2019-07-08 NOTE — Telephone Encounter (Signed)
No answer, no v/m

## 2019-07-09 ENCOUNTER — Other Ambulatory Visit: Payer: Self-pay

## 2019-07-09 ENCOUNTER — Ambulatory Visit (INDEPENDENT_AMBULATORY_CARE_PROVIDER_SITE_OTHER): Payer: Managed Care, Other (non HMO) | Admitting: Certified Nurse Midwife

## 2019-07-09 VITALS — BP 90/60 | Wt 185.0 lb

## 2019-07-09 DIAGNOSIS — O34219 Maternal care for unspecified type scar from previous cesarean delivery: Secondary | ICD-10-CM

## 2019-07-09 DIAGNOSIS — B373 Candidiasis of vulva and vagina: Secondary | ICD-10-CM

## 2019-07-09 DIAGNOSIS — A599 Trichomoniasis, unspecified: Secondary | ICD-10-CM

## 2019-07-09 DIAGNOSIS — Z3A33 33 weeks gestation of pregnancy: Secondary | ICD-10-CM

## 2019-07-09 DIAGNOSIS — Z23 Encounter for immunization: Secondary | ICD-10-CM

## 2019-07-09 DIAGNOSIS — B3731 Acute candidiasis of vulva and vagina: Secondary | ICD-10-CM

## 2019-07-09 DIAGNOSIS — Z3480 Encounter for supervision of other normal pregnancy, unspecified trimester: Secondary | ICD-10-CM

## 2019-07-09 LAB — POCT URINALYSIS DIPSTICK OB: Glucose, UA: NEGATIVE

## 2019-07-09 MED ORDER — METRONIDAZOLE 500 MG PO TABS: 500.0000 mg | ORAL_TABLET | Freq: Two times a day (BID) | ORAL | 0 refills | Status: AC

## 2019-07-09 MED ORDER — FLUCONAZOLE 150 MG PO TABS: ORAL_TABLET | ORAL | 0 refills | Status: DC

## 2019-07-09 NOTE — Progress Notes (Signed)
C/o feeling pain in vag area - hurts to move, walk, sit.

## 2019-07-09 NOTE — Telephone Encounter (Signed)
No answer, no v/m

## 2019-07-10 NOTE — Telephone Encounter (Signed)
No answer, no v/m

## 2019-07-19 NOTE — Progress Notes (Signed)
ROB at 33wk1d: Baby active. Complains of vulvovaginal soreness and pelvic pressure.  S/p treatment for Trichimonas and E.coli UTI. States that CS and BTL scheduled for 20 October Ill probably bottle feed, but she is thinking about pumping initially. Is already leaking clostrum Exam: External/BUS: mildly inflamed vestibule Vagina: white discharge Wet prep positive for Trich and hyphae. Negative for clue cells Cervix: closed/40%/-2 RX: Flagyl 500 mgm BID x 7 days. (added 4 more Flagyl to her RX for her partner to take at one sitting-no alcohol x 3 days)Diflucan 150 mgm every 3 days x 2 Talked with Izora Gala regarding CS schedule-Nancy has been unable to talk with Markus Daft about preop appointments-Needs to see patient at next appointment in 2 weeks TDAP today/ BT consent signed today Dalia Heading, CNM

## 2019-07-23 ENCOUNTER — Other Ambulatory Visit: Payer: Self-pay

## 2019-07-23 ENCOUNTER — Ambulatory Visit (INDEPENDENT_AMBULATORY_CARE_PROVIDER_SITE_OTHER): Payer: Managed Care, Other (non HMO) | Admitting: Maternal Newborn

## 2019-07-23 ENCOUNTER — Encounter: Payer: Self-pay | Admitting: Maternal Newborn

## 2019-07-23 VITALS — BP 120/80 | Wt 187.0 lb

## 2019-07-23 DIAGNOSIS — Z3480 Encounter for supervision of other normal pregnancy, unspecified trimester: Secondary | ICD-10-CM

## 2019-07-23 DIAGNOSIS — Z3A35 35 weeks gestation of pregnancy: Secondary | ICD-10-CM

## 2019-07-23 DIAGNOSIS — O34219 Maternal care for unspecified type scar from previous cesarean delivery: Secondary | ICD-10-CM

## 2019-07-23 NOTE — Patient Instructions (Signed)
Third Trimester of Pregnancy The third trimester is from week 28 through week 40 (months 7 through 9). The third trimester is a time when the unborn baby (fetus) is growing rapidly. At the end of the ninth month, the fetus is about 20 inches in length and weighs 6-10 pounds. Body changes during your third trimester Your body will continue to go through many changes during pregnancy. The changes vary from woman to woman. During the third trimester:  Your weight will continue to increase. You can expect to gain 25-35 pounds (11-16 kg) by the end of the pregnancy.  You may begin to get stretch marks on your hips, abdomen, and breasts.  You may urinate more often because the fetus is moving lower into your pelvis and pressing on your bladder.  You may develop or continue to have heartburn. This is caused by increased hormones that slow down muscles in the digestive tract.  You may develop or continue to have constipation because increased hormones slow digestion and cause the muscles that push waste through your intestines to relax.  You may develop hemorrhoids. These are swollen veins (varicose veins) in the rectum that can itch or be painful.  You may develop swollen, bulging veins (varicose veins) in your legs.  You may have increased body aches in the pelvis, back, or thighs. This is due to weight gain and increased hormones that are relaxing your joints.  You may have changes in your hair. These can include thickening of your hair, rapid growth, and changes in texture. Some women also have hair loss during or after pregnancy, or hair that feels dry or thin. Your hair will most likely return to normal after your baby is born.  Your breasts will continue to grow and they will continue to become tender. A yellow fluid (colostrum) may leak from your breasts. This is the first milk you are producing for your baby.  Your belly button may stick out.  You may notice more swelling in your hands,  face, or ankles.  You may have increased tingling or numbness in your hands, arms, and legs. The skin on your belly may also feel numb.  You may feel short of breath because of your expanding uterus.  You may have more problems sleeping. This can be caused by the size of your belly, increased need to urinate, and an increase in your body's metabolism.  You may notice the fetus "dropping," or moving lower in your abdomen (lightening).  You may have increased vaginal discharge.  You may notice your joints feel loose and you may have pain around your pelvic bone. What to expect at prenatal visits You will have prenatal exams every 2 weeks until week 36. Then you will have weekly prenatal exams. During a routine prenatal visit:  You will be weighed to make sure you and the baby are growing normally.  Your blood pressure will be taken.  Your abdomen will be measured to track your baby's growth.  The fetal heartbeat will be listened to.  Any test results from the previous visit will be discussed.  You may have a cervical check near your due date to see if your cervix has softened or thinned (effaced).  You will be tested for Group B streptococcus. This happens between 35 and 37 weeks. Your health care provider may ask you:  What your birth plan is.  How you are feeling.  If you are feeling the baby move.  If you have had any abnormal   symptoms, such as leaking fluid, bleeding, severe headaches, or abdominal cramping.  If you are using any tobacco products, including cigarettes, chewing tobacco, and electronic cigarettes.  If you have any questions. Other tests or screenings that may be performed during your third trimester include:  Blood tests that check for low iron levels (anemia).  Fetal testing to check the health, activity level, and growth of the fetus. Testing is done if you have certain medical conditions or if there are problems during the pregnancy.  Nonstress test  (NST). This test checks the health of your baby to make sure there are no signs of problems, such as the baby not getting enough oxygen. During this test, a belt is placed around your belly. The baby is made to move, and its heart rate is monitored during movement. What is false labor? False labor is a condition in which you feel small, irregular tightenings of the muscles in the womb (contractions) that usually go away with rest, changing position, or drinking water. These are called Braxton Hicks contractions. Contractions may last for hours, days, or even weeks before true labor sets in. If contractions come at regular intervals, become more frequent, increase in intensity, or become painful, you should see your health care provider. What are the signs of labor?  Abdominal cramps.  Regular contractions that start at 10 minutes apart and become stronger and more frequent with time.  Contractions that start on the top of the uterus and spread down to the lower abdomen and back.  Increased pelvic pressure and dull back pain.  A watery or bloody mucus discharge that comes from the vagina.  Leaking of amniotic fluid. This is also known as your "water breaking." It could be a slow trickle or a gush. Let your health care provider know if it has a color or strange odor. If you have any of these signs, call your health care provider right away, even if it is before your due date. Follow these instructions at home: Medicines  Follow your health care provider's instructions regarding medicine use. Specific medicines may be either safe or unsafe to take during pregnancy.  Take a prenatal vitamin that contains at least 600 micrograms (mcg) of folic acid.  If you develop constipation, try taking a stool softener if your health care provider approves. Eating and drinking   Eat a balanced diet that includes fresh fruits and vegetables, whole grains, good sources of protein such as meat, eggs, or tofu,  and low-fat dairy. Your health care provider will help you determine the amount of weight gain that is right for you.  Avoid raw meat and uncooked cheese. These carry germs that can cause birth defects in the baby.  If you have low calcium intake from food, talk to your health care provider about whether you should take a daily calcium supplement.  Eat four or five small meals rather than three large meals a day.  Limit foods that are high in fat and processed sugars, such as fried and sweet foods.  To prevent constipation: ? Drink enough fluid to keep your urine clear or pale yellow. ? Eat foods that are high in fiber, such as fresh fruits and vegetables, whole grains, and beans. Activity  Exercise only as directed by your health care provider. Most women can continue their usual exercise routine during pregnancy. Try to exercise for 30 minutes at least 5 days a week. Stop exercising if you experience uterine contractions.  Avoid heavy lifting.  Do   not exercise in extreme heat or humidity, or at high altitudes.  Wear low-heel, comfortable shoes.  Practice good posture.  You may continue to have sex unless your health care provider tells you otherwise. Relieving pain and discomfort  Take frequent breaks and rest with your legs elevated if you have leg cramps or low back pain.  Take warm sitz baths to soothe any pain or discomfort caused by hemorrhoids. Use hemorrhoid cream if your health care provider approves.  Wear a good support bra to prevent discomfort from breast tenderness.  If you develop varicose veins: ? Wear support pantyhose or compression stockings as told by your healthcare provider. ? Elevate your feet for 15 minutes, 3-4 times a day. Prenatal care  Write down your questions. Take them to your prenatal visits.  Keep all your prenatal visits as told by your health care provider. This is important. Safety  Wear your seat belt at all times when driving.  Make  a list of emergency phone numbers, including numbers for family, friends, the hospital, and police and fire departments. General instructions  Avoid cat litter boxes and soil used by cats. These carry germs that can cause birth defects in the baby. If you have a cat, ask someone to clean the litter box for you.  Do not travel far distances unless it is absolutely necessary and only with the approval of your health care provider.  Do not use hot tubs, steam rooms, or saunas.  Do not drink alcohol.  Do not use any products that contain nicotine or tobacco, such as cigarettes and e-cigarettes. If you need help quitting, ask your health care provider.  Do not use any medicinal herbs or unprescribed drugs. These chemicals affect the formation and growth of the baby.  Do not douche or use tampons or scented sanitary pads.  Do not cross your legs for long periods of time.  To prepare for the arrival of your baby: ? Take prenatal classes to understand, practice, and ask questions about labor and delivery. ? Make a trial run to the hospital. ? Visit the hospital and tour the maternity area. ? Arrange for maternity or paternity leave through employers. ? Arrange for family and friends to take care of pets while you are in the hospital. ? Purchase a rear-facing car seat and make sure you know how to install it in your car. ? Pack your hospital bag. ? Prepare the baby's nursery. Make sure to remove all pillows and stuffed animals from the baby's crib to prevent suffocation.  Visit your dentist if you have not gone during your pregnancy. Use a soft toothbrush to brush your teeth and be gentle when you floss. Contact a health care provider if:  You are unsure if you are in labor or if your water has broken.  You become dizzy.  You have mild pelvic cramps, pelvic pressure, or nagging pain in your abdominal area.  You have lower back pain.  You have persistent nausea, vomiting, or diarrhea.   You have an unusual or bad smelling vaginal discharge.  You have pain when you urinate. Get help right away if:  Your water breaks before 37 weeks.  You have regular contractions less than 5 minutes apart before 37 weeks.  You have a fever.  You are leaking fluid from your vagina.  You have spotting or bleeding from your vagina.  You have severe abdominal pain or cramping.  You have rapid weight loss or weight gain.  You have   shortness of breath with chest pain.  You notice sudden or extreme swelling of your face, hands, ankles, feet, or legs.  Your baby makes fewer than 10 movements in 2 hours.  You have severe headaches that do not go away when you take medicine.  You have vision changes. Summary  The third trimester is from week 28 through week 40, months 7 through 9. The third trimester is a time when the unborn baby (fetus) is growing rapidly.  During the third trimester, your discomfort may increase as you and your baby continue to gain weight. You may have abdominal, leg, and back pain, sleeping problems, and an increased need to urinate.  During the third trimester your breasts will keep growing and they will continue to become tender. A yellow fluid (colostrum) may leak from your breasts. This is the first milk you are producing for your baby.  False labor is a condition in which you feel small, irregular tightenings of the muscles in the womb (contractions) that eventually go away. These are called Braxton Hicks contractions. Contractions may last for hours, days, or even weeks before true labor sets in.  Signs of labor can include: abdominal cramps; regular contractions that start at 10 minutes apart and become stronger and more frequent with time; watery or bloody mucus discharge that comes from the vagina; increased pelvic pressure and dull back pain; and leaking of amniotic fluid. This information is not intended to replace advice given to you by your health  care provider. Make sure you discuss any questions you have with your health care provider. Document Released: 10/10/2001 Document Revised: 02/06/2019 Document Reviewed: 11/21/2016 Elsevier Patient Education  2020 [employer]  

## 2019-07-23 NOTE — Telephone Encounter (Signed)
Spoke to the patient while she was in the office for an appointment. Patient is aware of H&P, Pre-admit, Covid testing, and OR. Patient is aware she will be asked to quarantine after covid testing.

## 2019-07-23 NOTE — Progress Notes (Signed)
    Routine Prenatal Care Visit  Subjective  Melissa Coffey is a 23 y.o. [phone] at [redacted]w[redacted]d being seen today for ongoing prenatal care.  She is currently monitored for the following issues for this low-risk pregnancy and has Anemia in pregnancy; Previous cesarean delivery, antepartum condition or complication; Status post cesarean delivery; Anxiety and depression; Compulsive skin picking; Sepsis ([place]); and Supervision of other normal pregnancy, antepartum on their problem list.  ----------------------------------------------------------------------------------- Patient reports ongoing pelvic pressure and discharge.   Contractions: Not present. Vag. Bleeding: None.  Movement: Present. No leaking of fluid.  ----------------------------------------------------------------------------------- The following portions of the patient's history were reviewed and updated as appropriate: allergies, current medications, past family history, past medical history, past social history, past surgical history and problem list. Problem list updated.   Objective  Blood pressure 120/80, weight 187 lb (84.8 kg), last menstrual period 11/19/2018, unknown if currently breastfeeding. Pregravid weight 155 lb (70.3 kg) Total Weight Gain 32 lb (14.5 kg)  Fetal Status: Fetal Heart Rate (bpm): 138 Fundal Height: 36 cm Movement: Present     General:  Alert, oriented and cooperative. Patient is in no acute distress.  Skin: Skin is warm and dry. No rash noted.   Cardiovascular: Normal heart rate noted  Respiratory: Normal respiratory effort, no problems with respiration noted  Abdomen: Soft, gravid, appropriate for gestational age. Pain/Pressure: Present     Pelvic:  Cervical exam deferred        Extremities: Normal range of motion.  Edema: None  Mental Status: Normal mood and affect. Normal behavior. Normal judgment and thought content.     Assessment   23 y.o. [id number] at [redacted]w[redacted]d, EDD 08/26/2019 by Last Menstrual Period  presenting for a routine prenatal visit.  Plan   Pregnancy #4 Problems (from 01/03/19 to present)    Problem Noted Resolved   Supervision of other normal pregnancy, antepartum 01/03/2019 by Rod Can, CNM No   Overview Addendum 05/10/2019  2:44 PM by Dalia Heading, [place] Prenatal Labs  Dating  LMP=7wk [place] Blood type: O/Positive/-- (03/06 1453)   Genetic Screen  declines all Antibody:Negative (03/06 1453)  Anatomic [place]  incomplete for spine. FU anatomy scan-completed/ normal Rubella: <0.90 NONIMMUNE  Varicella: IMMUNE  GTT  Third trimester:  RPR: Non Reactive (03/06 1453)   Rhogam  not needed HBsAg: Negative (03/06 1453)   TDaP vaccine                       Flu Shot: HIV: Non Reactive (03/06 1453)   Baby Food       Bottle                         GBS:   Contraception BTL Pap: LSIL 2019  CBB     CS/VBAC C/s x2 2015 FITL, 2017 placental abruption   Support Person Partner Melissa Coffey          Previous cesarean delivery, antepartum condition or complication 0/94/7096 by Will Bonnet, MD No       Please refer to After Visit Summary for other counseling recommendations.   Return in about 1 week (around 07/30/2019) for [place].  Avel Sensor, CNM 07/23/2019  9:02 AM

## 2019-07-30 ENCOUNTER — Ambulatory Visit (INDEPENDENT_AMBULATORY_CARE_PROVIDER_SITE_OTHER): Payer: Managed Care, Other (non HMO) | Admitting: Obstetrics & Gynecology

## 2019-07-30 ENCOUNTER — Other Ambulatory Visit: Payer: Self-pay

## 2019-07-30 ENCOUNTER — Other Ambulatory Visit (HOSPITAL_COMMUNITY)
Admission: RE | Admit: 2019-07-30 | Discharge: 2019-07-30 | Disposition: A | Discharge status: Home / Self Care | Payer: Managed Care, Other (non HMO) | Source: Ambulatory Visit | Attending: Obstetrics & Gynecology | Admitting: Obstetrics & Gynecology

## 2019-07-30 ENCOUNTER — Encounter: Payer: Self-pay | Admitting: Obstetrics & Gynecology

## 2019-07-30 VITALS — BP 120/80 | Wt 190.0 lb

## 2019-07-30 DIAGNOSIS — Z113 Encounter for screening for infections with a predominantly sexual mode of transmission: Secondary | ICD-10-CM

## 2019-07-30 DIAGNOSIS — Z3A36 36 weeks gestation of pregnancy: Secondary | ICD-10-CM

## 2019-07-30 DIAGNOSIS — Z3685 Encounter for antenatal screening for Streptococcus B: Secondary | ICD-10-CM

## 2019-07-30 DIAGNOSIS — O34219 Maternal care for unspecified type scar from previous cesarean delivery: Secondary | ICD-10-CM

## 2019-07-30 DIAGNOSIS — Z3480 Encounter for supervision of other normal pregnancy, unspecified trimester: Secondary | ICD-10-CM

## 2019-07-30 LAB — OB RESULTS CONSOLE GBS: GBS: NEGATIVE

## 2019-07-30 LAB — OB RESULTS CONSOLE GC/CHLAMYDIA: Gonorrhea: NEGATIVE

## 2019-07-30 NOTE — Patient Instructions (Signed)
Group B Streptococcus Infection During Pregnancy  Group B Streptococcus (GBS) is a type of bacteria (Streptococcus agalactiae) that is often found in healthy people, commonly in the rectum, vagina, and intestines. In people who are healthy and not pregnant, the bacteria rarely cause serious illness or complications. However, women who test positive for GBS during pregnancy can pass the bacteria to their baby during childbirth, which can cause serious infection in the baby after birth. Women with GBS may also have infections during their pregnancy or immediately after childbirth, such as urinary tract infections (UTIs) or infections of the uterus (uterine infections). Having GBS also increases a woman's risk of complications during pregnancy, such as early (preterm) labor or delivery, miscarriage, or stillbirth. Routine testing (screening) for GBS is recommended for all pregnant women. What increases the risk? You may have a higher risk for GBS infection during pregnancy if you had one during a past pregnancy. What are the signs or symptoms? In most cases, GBS infection does not cause symptoms in pregnant women. Signs and symptoms of a possible GBS-related infection may include:  Labor starting before the 37th week of pregnancy.  A UTI or bladder infection, which may cause: ? Fever. ? Pain or burning during urination. ? Frequent urination.  Fever during labor, along with: ? Bad-smelling discharge. ? Uterine tenderness. ? Rapid heartbeat in the mother, baby, or both. Rare but serious symptoms of a possible GBS-related infection in women include:  Blood infection (septicemia). This may cause fever, chills, or confusion.  Lung infection (pneumonia). This may cause fever, chills, cough, rapid breathing, difficulty breathing, or chest pain.  Bone, joint, skin, or soft tissue infection. How is this diagnosed? You may be screened for GBS between week 35 and week 37 of your pregnancy. If you have  symptoms of preterm labor, you may be screened earlier. This condition is diagnosed based on lab test results from:  A swab of fluid from the vagina and rectum.  A urine sample. How is this treated? This condition is treated with antibiotic medicine. When you go into labor, or as soon as your water breaks (your membranes rupture), you will be given antibiotics through an IV tube. Antibiotics will continue until after you give birth. If you are having a cesarean delivery, you do not need antibiotics unless your membranes have already ruptured. Follow these instructions at home:  Take over-the-counter and prescription medicines only as told by your health care provider.  Take your antibiotic medicine as told by your health care provider. Do not stop taking the antibiotic even if you start to feel better.  Keep all pre-birth (prenatal) visits and follow-up visits as told by your health care provider. This is important. Contact a health care provider if:  You have pain or burning when you urinate.  You have to urinate frequently.  You have a fever or chills.  You develop a bad-smelling vaginal discharge. Get help right away if:  Your membranes rupture.  You go into labor.  You have severe pain in your abdomen.  You have difficulty breathing.  You have chest pain. This information is not intended to replace advice given to you by your health care provider. Make sure you discuss any questions you have with your health care provider. Document Released: 01/23/2008 Document Revised: 02/06/2019 Document Reviewed: 05/11/2016 Elsevier Patient Education  2020 Elsevier Inc.  

## 2019-07-30 NOTE — Progress Notes (Signed)
  Subjective  Fetal Movement? yes Contractions? no Leaking Fluid? no Vaginal Bleeding? no Some back pains Objective  BP 120/80   Wt 190 lb (86.2 kg)   LMP 11/19/2018 (Exact Date)   BMI 33.66 kg/m  General: NAD Pumonary: no increased work of breathing Abdomen: gravid, non-tender Extremities: no edema Psychiatric: mood appropriate, affect full SVE: 0/40/-3 VTX Assessment  23 y.o. [id number] at [redacted]w[redacted]d by  08/26/2019, by Last Menstrual Period presenting for routine prenatal visit  Plan   Problem List Items Addressed This Visit      Other   Previous cesarean delivery, antepartum condition or complication   Supervision of other normal pregnancy, antepartum    Other Visit Diagnoses    [redacted] weeks gestation of pregnancy    -  Primary   Encounter for antenatal screening for Streptococcus B       Relevant Orders   Culture, beta strep (group b only)   Screen for STD (sexually transmitted disease)       Relevant Orders   Cervicovaginal ancillary only      Pregnancy #4 Problems (from 01/03/19 to present)    Problem Noted Resolved   Supervision of other normal pregnancy, antepartum 01/03/2019 by Rod Can, CNM No   Overview Addendum 05/10/2019  2:44 PM by Dalia Heading, [place] Prenatal Labs  Dating  LMP=7wk [place] Blood type: O/Positive/-- (03/06 1453)   Genetic Screen  declines all Antibody:Negative (03/06 1453)  Anatomic [place]  incomplete for spine. FU anatomy scan-completed/ normal Rubella: <0.90 NONIMMUNE  Varicella: IMMUNE  GTT  Third trimester:  RPR: Non Reactive (03/06 1453)   Rhogam  not needed HBsAg: Negative (03/06 1453)   TDaP vaccine                       Flu Shot: HIV: Non Reactive (03/06 1453)   Baby Food       Bottle                         GBS:   Contraception BTL Pap: LSIL 2019  CBB     CS/VBAC C/s x2 2015 FITL, 2017 placental abruption   Support Person Partner Merrilee Seashore          Previous cesarean delivery, antepartum condition or complication  5/80/9983 by Will Bonnet, MD No     GBS and APtima for Trich TOC done today  PNV, [facility], labor precautions discussed  Barnett Applebaum, MD, [facility], [facility] 07/30/2019  9:01 AM

## 2019-07-31 ENCOUNTER — Other Ambulatory Visit: Payer: Self-pay | Admitting: Obstetrics & Gynecology

## 2019-07-31 LAB — CERVICOVAGINAL ANCILLARY ONLY
Chlamydia: NEGATIVE
Molecular Disclaimer: NEGATIVE
Molecular Disclaimer: NEGATIVE
Molecular Disclaimer: NORMAL
Neisseria Gonorrhea: NEGATIVE
Trichomonas: POSITIVE — AB

## 2019-07-31 MED ORDER — METRONIDAZOLE 500 MG PO TABS: 2000.0000 mg | ORAL_TABLET | Freq: Once | ORAL | 0 refills | Status: AC

## 2019-07-31 NOTE — Progress Notes (Signed)
Cultures again positive for Trich D/W pt Flagyl Rx called in [facility] and have partner treated  Barnett Applebaum, MD, [place], [facility] 07/31/2019  5:47 PM

## 2019-08-03 LAB — CULTURE, BETA STREP (GROUP B ONLY): Strep Gp B Culture: NEGATIVE

## 2019-08-05 ENCOUNTER — Ambulatory Visit (INDEPENDENT_AMBULATORY_CARE_PROVIDER_SITE_OTHER): Payer: Managed Care, Other (non HMO) | Admitting: Obstetrics and Gynecology

## 2019-08-05 ENCOUNTER — Encounter: Payer: Self-pay | Admitting: Obstetrics and Gynecology

## 2019-08-05 ENCOUNTER — Other Ambulatory Visit: Payer: Self-pay

## 2019-08-05 VITALS — BP 118/78 | Wt 188.0 lb

## 2019-08-05 DIAGNOSIS — O99343 Other mental disorders complicating pregnancy, third trimester: Secondary | ICD-10-CM

## 2019-08-05 DIAGNOSIS — Z3A37 37 weeks gestation of pregnancy: Secondary | ICD-10-CM

## 2019-08-05 DIAGNOSIS — F419 Anxiety disorder, unspecified: Secondary | ICD-10-CM

## 2019-08-05 DIAGNOSIS — F329 Major depressive disorder, single episode, unspecified: Secondary | ICD-10-CM

## 2019-08-05 DIAGNOSIS — Z3480 Encounter for supervision of other normal pregnancy, unspecified trimester: Secondary | ICD-10-CM

## 2019-08-05 DIAGNOSIS — O34219 Maternal care for unspecified type scar from previous cesarean delivery: Secondary | ICD-10-CM

## 2019-08-05 NOTE — Progress Notes (Signed)
  Routine Prenatal Care Visit  Subjective  Melissa Coffey is a 23 y.o. [phone] at [redacted]w[redacted]d being seen today for ongoing prenatal care.  She is currently monitored for the following issues for this high-risk pregnancy and has Anemia in pregnancy; Previous cesarean delivery, antepartum condition or complication; Status post cesarean delivery; Anxiety and depression; Compulsive skin picking; Sepsis ([place]); and Supervision of other normal pregnancy, antepartum on their problem list.  ----------------------------------------------------------------------------------- Patient reports no complaints.   Contractions: Not present. Vag. Bleeding: None.  Movement: Present. Leaking Fluid denies.  ----------------------------------------------------------------------------------- The following portions of the patient's history were reviewed and updated as appropriate: allergies, current medications, past family history, past medical history, past social history, past surgical history and problem list. Problem list updated.  Objective  Blood pressure 118/78, weight 188 lb (85.3 kg), last menstrual period 11/19/2018, unknown if currently breastfeeding. Pregravid weight 155 lb (70.3 kg) Total Weight Gain 33 lb (15 kg) Urinalysis: Urine Protein    Urine Glucose    Fetal Status: Fetal Heart Rate (bpm): 140 Fundal Height: 37 cm Movement: Present     General:  Alert, oriented and cooperative. Patient is in no acute distress.  Skin: Skin is warm and dry. No rash noted.   Cardiovascular: Normal heart rate noted  Respiratory: Normal respiratory effort, no problems with respiration noted  Abdomen: Soft, gravid, appropriate for gestational age. Pain/Pressure: Present     Pelvic:  Cervical exam deferred        Extremities: Normal range of motion.  Edema: None  Mental Status: Normal mood and affect. Normal behavior. Normal judgment and thought content.   Assessment   23 y.o. [id number] at [redacted]w[redacted]d by  08/26/2019, by Last  Menstrual Period presenting for routine prenatal visit  Plan   Pregnancy #4 Problems (from 01/03/19 to present)    Problem Noted Resolved   Supervision of other normal pregnancy, antepartum 01/03/2019 by Rod Can, CNM No   Overview Addendum 05/10/2019  2:44 PM by Dalia Heading, [place] Prenatal Labs  Dating  LMP=7wk [place] Blood type: O/Positive/-- (03/06 1453)   Genetic Screen  declines all Antibody:Negative (03/06 1453)  Anatomic [place]  incomplete for spine. FU anatomy scan-completed/ normal Rubella: <0.90 NONIMMUNE  Varicella: IMMUNE  GTT  Third trimester:  RPR: Non Reactive (03/06 1453)   Rhogam  not needed HBsAg: Negative (03/06 1453)   TDaP vaccine                       Flu Shot: HIV: Non Reactive (03/06 1453)   Baby Food       Bottle                         GBS:   Contraception BTL Pap: LSIL 2019  CBB     CS/VBAC C/s x2 2015 FITL, 2017 placental abruption   Support Person Partner Merrilee Seashore          Previous cesarean delivery, antepartum condition or complication 4/69/6295 by Will Bonnet, MD No       Term labor symptoms and general obstetric precautions including but not limited to vaginal bleeding, contractions, leaking of fluid and fetal movement were reviewed in detail with the patient. Please refer to After Visit Summary for other counseling recommendations.   Return in about 1 week (around 08/12/2019) for Routine Prenatal Appointment.  Prentice Docker, MD, [facility], [facility] 08/05/2019 11:29 AM

## 2019-08-11 ENCOUNTER — Other Ambulatory Visit: Payer: Self-pay

## 2019-08-11 ENCOUNTER — Ambulatory Visit (INDEPENDENT_AMBULATORY_CARE_PROVIDER_SITE_OTHER): Payer: Managed Care, Other (non HMO) | Admitting: Obstetrics and Gynecology

## 2019-08-11 ENCOUNTER — Encounter: Payer: Self-pay | Admitting: Obstetrics and Gynecology

## 2019-08-11 VITALS — BP 118/78 | Ht 64.0 in | Wt 189.0 lb

## 2019-08-11 DIAGNOSIS — Z3A37 37 weeks gestation of pregnancy: Secondary | ICD-10-CM

## 2019-08-11 DIAGNOSIS — O34219 Maternal care for unspecified type scar from previous cesarean delivery: Secondary | ICD-10-CM

## 2019-08-11 DIAGNOSIS — O99013 Anemia complicating pregnancy, third trimester: Secondary | ICD-10-CM

## 2019-08-11 DIAGNOSIS — Z3480 Encounter for supervision of other normal pregnancy, unspecified trimester: Secondary | ICD-10-CM

## 2019-08-11 NOTE — Progress Notes (Signed)
OB History & Physical   History of Present Illness:  Chief Complaint: Here for pre-operative visit  HPI:  Melissa Coffey is a 23 y.o. [id number] female at [redacted]w[redacted]d dated by LMP consistent with 7 week ultrasound.  Her pregnancy has been complicated by history of c-section x 2.    She denies contractions, though she has some mild mid-abdominal pain.   She denies leakage of fluid.   She denies vaginal bleeding.   She reports fetal movement.    Total weight gain for pregnancy: 33 lb (15 kg)   Obstetrical Problem List: Pregnancy #4 Problems (from 01/03/19 to present)    Problem Noted Resolved   Supervision of other normal pregnancy, antepartum 01/03/2019 by Tresea Mall, CNM No   Overview Addendum 05/10/2019  2:44 PM by Farrel Conners, CNM    Clinic [facility] Prenatal Labs  Dating  LMP=7wk [place] Blood type: O/Positive/-- (03/06 1453)   Genetic Screen  declines all Antibody:Negative (03/06 1453)  Anatomic [place]  incomplete for spine. FU anatomy scan-completed/ normal Rubella: <0.90 NONIMMUNE  Varicella: IMMUNE  GTT  Third trimester: 117 RPR: Non Reactive (03/06 1453)   Rhogam  not needed HBsAg: Negative (03/06 1453)   TDaP vaccine 07/09/2019                       Flu Shot: declined HIV: Non Reactive (03/06 1453)   Baby Food       Bottle                         GBS: negative 9/30  Contraception BTL Pap: LSIL 2019  CBB     CS/VBAC C/s x2 2015 FITL, 2017 placental abruption   Support Person Partner Weston Brass         Previous cesarean delivery, antepartum condition or complication 05/09/2016 by Conard Novak, MD No       Maternal Medical History:   Past Medical History:  Diagnosis Date  . Anemia   . Depression   . Endometriosis   . History of blood transfusion 2017   after placental abruption  . Pyelonephritis 05/2018  . Sepsis ([facility]) 05/2018   associated with pyelo  . UTI (urinary tract infection)     Past Surgical History:  Procedure Laterality Date  . ABDOMINAL SURGERY    .  CESAREAN SECTION  02/2014   pLTCS. FITL at term  . CESAREAN SECTION N/A 05/09/2016   Procedure: CESAREAN SECTION;  Surgeon: Conard Novak, MD;  Location: [facility] ORS;  Service: Obstetrics;  Laterality: N/A;  . NECK SURGERY  2003  . TONSILLECTOMY      No Known Allergies  Prior to Admission medications   Medication Sig Start Date End Date Taking? Authorizing Christabell Loseke  ferrous sulfate 325 (65 FE) MG tablet Take 1 tablet (325 mg total) by mouth daily with breakfast. Patient not taking: Reported on 04/10/2019 03/11/19   Farrel Conners, CNM  Prenatal Vit-Fe Fumarate-FA (MULTIVITAMIN-PRENATAL) 27-0.8 MG TABS tablet Take 1 tablet by mouth daily at 12 noon.    Jamason Peckham, Historical, MD    OB History  Gravida Para Term Preterm AB Living  4 2 1 1 1 2   SAB TAB Ectopic Multiple Live Births  1     0 2    # Outcome Date GA Lbr Len/2nd Weight Sex Delivery Anes PTL Lv  4 Current           3 SAB 10/10/18  2 Preterm 05/09/16 [redacted]w[redacted]d  5 lb 10 oz (2.551 kg) F CS-LTranv   LIV  1 Term 03/15/14   7 lb 14 oz (3.572 kg) M CS-LTranv   LIV    Obstetric Comments  02/2014: pLTCS for [place]    Prenatal care site: [facility]  Social History: She  reports that she quit smoking about 8 months ago. Her smoking use included cigarettes. She has a 0.25 pack-year smoking history. She has never used smokeless tobacco. She reports previous drug use. She reports that she does not drink alcohol.  Family History: family history includes Breast cancer in her maternal grandmother; Cancer in her maternal grandmother; Diabetes in her maternal grandmother and maternal uncle; Healthy in her brother, brother, daughter, mother, and son; Hypertension in her maternal grandmother.   Review of Systems:  Review of Systems  Constitutional: Negative.   HENT: Negative.   Eyes: Negative.   Respiratory: Negative.   Cardiovascular: Negative.   Gastrointestinal: Negative.   Genitourinary: Negative.   Musculoskeletal:  Negative.   Skin: Negative.   Neurological: Negative.   Psychiatric/Behavioral: Negative.      Physical Exam:  BP 118/78   Ht 5\' 4"  (1.626 m)   Wt 189 lb (85.7 kg)   LMP 11/19/2018 (Exact Date)   BMI 32.44 kg/m   Physical Exam Constitutional:      General: She is not in acute distress.    Appearance: Normal appearance. She is well-developed.  HENT:     Head: Normocephalic and atraumatic.  Eyes:     General: No scleral icterus.    Conjunctiva/sclera: Conjunctivae normal.  Neck:     Musculoskeletal: Normal range of motion and neck supple.  Cardiovascular:     Rate and Rhythm: Normal rate and regular rhythm.     Heart sounds: No murmur. No friction rub. No gallop.   Pulmonary:     Effort: Pulmonary effort is normal. No respiratory distress.     Breath sounds: Normal breath sounds. No wheezing or rales.  Abdominal:     General: Bowel sounds are normal. There is no distension.     Palpations: Abdomen is soft.     Tenderness: There is no abdominal tenderness. There is no guarding or rebound.     Comments: Gravid, NT  Musculoskeletal: Normal range of motion.  Neurological:     General: No focal deficit present.     Mental Status: She is alert and oriented to person, place, and time.     Cranial Nerves: No cranial nerve deficit.  Skin:    General: Skin is warm and dry.     Findings: No erythema.  Psychiatric:        Mood and Affect: Mood normal.        Behavior: Behavior normal.        Judgment: Judgment normal.     FHR: 130 bpm  Wet Prep: Trichomonas: negative   No results found for: [id number]]  Assessment:  Melissa Coffey is a 23 y.o. [id number] female at [redacted]w[redacted]d with history of cesarean section, desires repeat. Desires permanent sterility.   Plan:  1. Admit to Labor & Delivery  2. CBC, T&S, NPO, IVF 3. GBS negative.   4. Admit for cesarean delivery, repeat with bilateral tubal ligation. 66. 23 y.o. [id number]  with undesired fertility, desires permanent  sterilization.  Other reversible forms of contraception were discussed with patient; she declines all other modalities. Permanent nature of as well as associated risks of the procedure discussed with patient  including but not limited to: risk of regret, permanence of method, bleeding, infection, injury to surrounding organs and need for additional procedures.  Failure risk of 0.5-1% with increased risk of ectopic gestation if pregnancy occurs was also discussed with patient.      Stephen Jackson, MD 08/11/2019 8:25 AM    

## 2019-08-11 NOTE — H&P (View-Only) (Signed)
OB History & Physical   History of Present Illness:  Chief Complaint: Here for pre-operative visit  HPI:  Melissa Coffey is a 23 y.o. [id number] female at [redacted]w[redacted]d dated by LMP consistent with 7 week ultrasound.  Her pregnancy has been complicated by history of c-section x 2.    She denies contractions, though she has some mild mid-abdominal pain.   She denies leakage of fluid.   She denies vaginal bleeding.   She reports fetal movement.    Total weight gain for pregnancy: 33 lb (15 kg)   Obstetrical Problem List: Pregnancy #4 Problems (from 01/03/19 to present)    Problem Noted Resolved   Supervision of other normal pregnancy, antepartum 01/03/2019 by Tresea Mall, CNM No   Overview Addendum 05/10/2019  2:44 PM by Farrel Conners, CNM    Clinic [facility] Prenatal Labs  Dating  LMP=7wk [place] Blood type: O/Positive/-- (03/06 1453)   Genetic Screen  declines all Antibody:Negative (03/06 1453)  Anatomic [place]  incomplete for spine. FU anatomy scan-completed/ normal Rubella: <0.90 NONIMMUNE  Varicella: IMMUNE  GTT  Third trimester: 117 RPR: Non Reactive (03/06 1453)   Rhogam  not needed HBsAg: Negative (03/06 1453)   TDaP vaccine 07/09/2019                       Flu Shot: declined HIV: Non Reactive (03/06 1453)   Baby Food       Bottle                         GBS: negative 9/30  Contraception BTL Pap: LSIL 2019  CBB     CS/VBAC C/s x2 2015 FITL, 2017 placental abruption   Support Person Partner [facility] Brass         Previous cesarean delivery, antepartum condition or complication 05/09/2016 by Conard Novak, MD No       Maternal Medical History:   Past Medical History:  Diagnosis Date  . Anemia   . Depression   . Endometriosis   . History of blood transfusion 2017   after placental abruption  . Pyelonephritis 05/2018  . Sepsis (HCC) 05/2018   associated with pyelo  . UTI (urinary tract infection)     Past Surgical History:  Procedure Laterality Date  . ABDOMINAL SURGERY    .  CESAREAN SECTION  02/2014   pLTCS. FITL at term  . CESAREAN SECTION N/A 05/09/2016   Procedure: CESAREAN SECTION;  Surgeon: Conard Novak, MD;  Location: [facility] ORS;  Service: Obstetrics;  Laterality: N/A;  . NECK SURGERY  2003  . TONSILLECTOMY      No Known Allergies  Prior to Admission medications   Medication Sig Start Date End Date Taking? Authorizing Provider  ferrous sulfate 325 (65 FE) MG tablet Take 1 tablet (325 mg total) by mouth daily with breakfast. Patient not taking: Reported on 04/10/2019 03/11/19   Farrel Conners, CNM  Prenatal Vit-Fe Fumarate-FA (MULTIVITAMIN-PRENATAL) 27-0.8 MG TABS tablet Take 1 tablet by mouth daily at 12 noon.    [provider]    OB History  Gravida Para Term Preterm AB Living  4 2 1 1 1 2   SAB TAB Ectopic Multiple Live Births  1     0 2    # Outcome Date GA Lbr Len/2nd Weight Sex Delivery Anes PTL Lv  4 Current           3 SAB 10/10/18  2 Preterm 05/09/16 [redacted]w[redacted]d  5 lb 10 oz (2.551 kg) F CS-LTranv   LIV  1 Term 03/15/14   7 lb 14 oz (3.572 kg) M CS-LTranv   LIV    Obstetric Comments  02/2014: pLTCS for [place]    Prenatal care site: [facility]  Social History: She  reports that she quit smoking about 8 months ago. Her smoking use included cigarettes. She has a 0.25 pack-year smoking history. She has never used smokeless tobacco. She reports previous drug use. She reports that she does not drink alcohol.  Family History: family history includes Breast cancer in her maternal grandmother; Cancer in her maternal grandmother; Diabetes in her maternal grandmother and maternal uncle; Healthy in her brother, brother, daughter, mother, and son; Hypertension in her maternal grandmother.   Review of Systems:  Review of Systems  Constitutional: Negative.   HENT: Negative.   Eyes: Negative.   Respiratory: Negative.   Cardiovascular: Negative.   Gastrointestinal: Negative.   Genitourinary: Negative.   Musculoskeletal:  Negative.   Skin: Negative.   Neurological: Negative.   Psychiatric/Behavioral: Negative.      Physical Exam:  BP 118/78   Ht 5\' 4"  (1.626 m)   Wt 189 lb (85.7 kg)   LMP 11/19/2018 (Exact Date)   BMI 32.44 kg/m   Physical Exam Constitutional:      General: She is not in acute distress.    Appearance: Normal appearance. She is well-developed.  HENT:     Head: Normocephalic and atraumatic.  Eyes:     General: No scleral icterus.    Conjunctiva/sclera: Conjunctivae normal.  Neck:     Musculoskeletal: Normal range of motion and neck supple.  Cardiovascular:     Rate and Rhythm: Normal rate and regular rhythm.     Heart sounds: No murmur. No friction rub. No gallop.   Pulmonary:     Effort: Pulmonary effort is normal. No respiratory distress.     Breath sounds: Normal breath sounds. No wheezing or rales.  Abdominal:     General: Bowel sounds are normal. There is no distension.     Palpations: Abdomen is soft.     Tenderness: There is no abdominal tenderness. There is no guarding or rebound.     Comments: Gravid, NT  Musculoskeletal: Normal range of motion.  Neurological:     General: No focal deficit present.     Mental Status: She is alert and oriented to person, place, and time.     Cranial Nerves: No cranial nerve deficit.  Skin:    General: Skin is warm and dry.     Findings: No erythema.  Psychiatric:        Mood and Affect: Mood normal.        Behavior: Behavior normal.        Judgment: Judgment normal.     FHR: 130 bpm  Wet Prep: Trichomonas: negative   No results found for: SARSCOV2NAA]  Assessment:  Melissa Coffey is a 23 y.o. [id number] female at [redacted]w[redacted]d with history of cesarean section, desires repeat. Desires permanent sterility.   Plan:  1. Admit to Labor & Delivery  2. CBC, T&S, NPO, IVF 3. GBS negative.   4. Admit for cesarean delivery, repeat with bilateral tubal ligation. 66. 23 y.o. [id number]  with undesired fertility, desires permanent  sterilization.  Other reversible forms of contraception were discussed with patient; she declines all other modalities. Permanent nature of as well as associated risks of the procedure discussed with patient  including but not limited to: risk of regret, permanence of method, bleeding, infection, injury to surrounding organs and need for additional procedures.  Failure risk of 0.5-1% with increased risk of ectopic gestation if pregnancy occurs was also discussed with patient.      Thomasene MohairStephen Baelyn Doring, MD 08/11/2019 8:25 AM

## 2019-08-12 ENCOUNTER — Encounter
Admission: RE | Admit: 2019-08-12 | Discharge: 2019-08-12 | Disposition: A | Discharge status: Home / Self Care | Payer: Managed Care, Other (non HMO) | Source: Ambulatory Visit | Attending: Obstetrics and Gynecology | Admitting: Obstetrics and Gynecology

## 2019-08-12 ENCOUNTER — Other Ambulatory Visit: Payer: Self-pay

## 2019-08-12 NOTE — Patient Instructions (Signed)
Your procedure is scheduled on: Tues 10/20 Report to [facility]  Call [phone].  And you will be escorted to L&D  Remember: Instructions that are not followed completely may result in serious medical risk,  up to and including death, or upon the discretion of your surgeon and anesthesiologist your  surgery may need to be rescheduled.     _X__ 1. Do not eat food after midnight the night before your procedure.                 No gum chewing or hard candies. You may drink clear liquids up to 2 hours                 before you are scheduled to arrive for your surgery- DO not drink clear                 liquids within 2 hours of the start of your surgery.                 Clear Liquids include:  water, apple juice without pulp, clear carbohydrate                 drink such as Clearfast of Gatorade, Black Coffee or Tea (Do not add                 anything to coffee or tea).  __X__2.  On the morning of surgery brush your teeth with toothpaste and water, you                may rinse your mouth with mouthwash if you wish.  Do not swallow any toothpaste of mouthwash.     ___ 3.  No Alcohol for 24 hours before or after surgery.   ___ 4.  Do Not Smoke or use e-cigarettes For 24 Hours Prior to Your Surgery.                 Do not use any chewable tobacco products for at least 6 hours prior to                 surgery.  ____  5.  Bring all medications with you on the day of surgery if instructed.   __x__  6.  Notify your doctor if there is any change in your medical condition      (cold, fever, infections).     Do not wear jewelry, make-up, hairpins, clips or nail polish. Do not wear lotions, powders, or perfumes. You may wear deodorant. Do not shave 48 hours prior to surgery. Men may shave face and neck. Do not bring valuables to the hospital.    [facility] is not responsible for any belongings or valuables.  Contacts, dentures or bridgework may not be worn into  surgery. Leave your suitcase in the car. After surgery it may be brought to your room. For patients admitted to the hospital, discharge time is determined by your treatment team.   Patients discharged the day of surgery will not be allowed to drive home.   Please read over the following fact sheets that you were given:   ____ Take these medicines the morning of surgery with A SIP OF WATER:    1. none  2.   3.   4.  5.  6.  ____ Fleet Enema (as directed)   _x___ Use CHG Soap as directed  ____ Use inhalers on the day of surgery  ____ Stop metformin 2 days  prior to surgery    ____ Take 1/2 of usual insulin dose the night before surgery. No insulin the morning          of surgery.   ____ Stop Coumadin/Plavix/aspirin on   ____ Stop Anti-inflammatories on    ____ Stop supplements until after surgery.    ____ Bring C-Pap to the hospital.

## 2019-08-15 ENCOUNTER — Other Ambulatory Visit: Payer: Self-pay

## 2019-08-15 ENCOUNTER — Other Ambulatory Visit
Admission: RE | Admit: 2019-08-15 | Discharge: 2019-08-15 | Disposition: A | Discharge status: Home / Self Care | Payer: Managed Care, Other (non HMO) | Source: Ambulatory Visit | Attending: Obstetrics and Gynecology | Admitting: Obstetrics and Gynecology

## 2019-08-15 DIAGNOSIS — Z20828 Contact with and (suspected) exposure to other viral communicable diseases: Secondary | ICD-10-CM | POA: Insufficient documentation

## 2019-08-15 DIAGNOSIS — Z01812 Encounter for preprocedural laboratory examination: Secondary | ICD-10-CM | POA: Insufficient documentation

## 2019-08-15 LAB — SARS CORONAVIRUS 2 (TAT 6-24 HRS): SARS Coronavirus 2: NEGATIVE

## 2019-08-19 ENCOUNTER — Inpatient Hospital Stay
Admission: RE | Admit: 2019-08-19 | Discharge: 2019-08-21 | DRG: 784 | Disposition: A | Discharge status: Home / Self Care | Payer: Managed Care, Other (non HMO) | Attending: Obstetrics and Gynecology | Admitting: Obstetrics and Gynecology

## 2019-08-19 ENCOUNTER — Inpatient Hospital Stay: Payer: Managed Care, Other (non HMO) | Admitting: Anesthesiology

## 2019-08-19 ENCOUNTER — Encounter
Admission: RE | Disposition: A | Discharge status: Home / Self Care | Payer: Self-pay | Source: Home / Self Care | Attending: Obstetrics and Gynecology

## 2019-08-19 ENCOUNTER — Other Ambulatory Visit: Payer: Self-pay

## 2019-08-19 DIAGNOSIS — O34211 Maternal care for low transverse scar from previous cesarean delivery: Secondary | ICD-10-CM | POA: Diagnosis present

## 2019-08-19 DIAGNOSIS — O9081 Anemia of the puerperium: Secondary | ICD-10-CM | POA: Diagnosis not present

## 2019-08-19 DIAGNOSIS — D62 Acute posthemorrhagic anemia: Secondary | ICD-10-CM | POA: Diagnosis not present

## 2019-08-19 DIAGNOSIS — Z87891 Personal history of nicotine dependence: Secondary | ICD-10-CM

## 2019-08-19 DIAGNOSIS — O34219 Maternal care for unspecified type scar from previous cesarean delivery: Secondary | ICD-10-CM | POA: Diagnosis present

## 2019-08-19 DIAGNOSIS — Z302 Encounter for sterilization: Secondary | ICD-10-CM | POA: Diagnosis not present

## 2019-08-19 DIAGNOSIS — Z23 Encounter for immunization: Secondary | ICD-10-CM | POA: Diagnosis not present

## 2019-08-19 DIAGNOSIS — F32A Depression, unspecified: Secondary | ICD-10-CM | POA: Diagnosis present

## 2019-08-19 DIAGNOSIS — Z3A39 39 weeks gestation of pregnancy: Secondary | ICD-10-CM

## 2019-08-19 DIAGNOSIS — Z98891 History of uterine scar from previous surgery: Secondary | ICD-10-CM

## 2019-08-19 DIAGNOSIS — Z3480 Encounter for supervision of other normal pregnancy, unspecified trimester: Secondary | ICD-10-CM

## 2019-08-19 DIAGNOSIS — F419 Anxiety disorder, unspecified: Secondary | ICD-10-CM | POA: Diagnosis present

## 2019-08-19 DIAGNOSIS — F329 Major depressive disorder, single episode, unspecified: Secondary | ICD-10-CM | POA: Diagnosis present

## 2019-08-19 LAB — RAPID HIV SCREEN (HIV 1/2 AB+AG)
HIV 1/2 Antibodies: NONREACTIVE
HIV-1 P24 Antigen - HIV24: NONREACTIVE

## 2019-08-19 LAB — MRSA PCR SCREENING: MRSA by PCR: NEGATIVE

## 2019-08-19 LAB — CBC
HCT: 31.6 % — ABNORMAL LOW (ref 36.0–46.0)
Hemoglobin: 10.2 g/dL — ABNORMAL LOW (ref 12.0–15.0)
MCH: 25.6 pg — ABNORMAL LOW (ref 26.0–34.0)
MCHC: 32.3 g/dL (ref 30.0–36.0)
MCV: 79.2 fL — ABNORMAL LOW (ref 80.0–100.0)
Platelets: 310 10*3/uL (ref 150–400)
RBC: 3.99 MIL/uL (ref 3.87–5.11)
RDW: 14.3 % (ref 11.5–15.5)
WBC: 14.4 10*3/uL — ABNORMAL HIGH (ref 4.0–10.5)
nRBC: 0 % (ref 0.0–0.2)

## 2019-08-19 LAB — TYPE AND SCREEN
ABO/RH(D): O POS
Antibody Screen: NEGATIVE

## 2019-08-19 LAB — RPR: RPR Ser Ql: NONREACTIVE

## 2019-08-19 SURGERY — Surgical Case
Anesthesia: Spinal

## 2019-08-19 MED ORDER — ONDANSETRON HCL 4 MG/2ML IJ SOLN: 4.0000 mg | Freq: Once | INTRAMUSCULAR | Status: DC | PRN

## 2019-08-19 MED ORDER — FENTANYL CITRATE (PF) 100 MCG/2ML IJ SOLN: 25.0000 ug | INTRAMUSCULAR | Status: DC | PRN

## 2019-08-19 MED ORDER — MENTHOL 3 MG MT LOZG
1.0000 | LOZENGE | OROMUCOSAL | Status: DC | PRN
  Filled 2019-08-19: qty 9

## 2019-08-19 MED ORDER — DIPHENHYDRAMINE HCL 25 MG PO CAPS: 25.0000 mg | ORAL_CAPSULE | Freq: Four times a day (QID) | ORAL | Status: DC | PRN

## 2019-08-19 MED ORDER — CEFAZOLIN SODIUM-DEXTROSE 2-4 GM/100ML-% IV SOLN
2.0000 g | INTRAVENOUS | Status: AC
  Administered 2019-08-19: 08:00:00 2 g via INTRAVENOUS
  Filled 2019-08-19: qty 100

## 2019-08-19 MED ORDER — OXYCODONE-ACETAMINOPHEN 5-325 MG PO TABS: 2.0000 | ORAL_TABLET | ORAL | Status: DC | PRN

## 2019-08-19 MED ORDER — FENTANYL CITRATE (PF) 100 MCG/2ML IJ SOLN
INTRAMUSCULAR | Status: DC | PRN
  Administered 2019-08-19: 15 ug via INTRAVENOUS
  Administered 2019-08-19 (×4): 50 ug via INTRAVENOUS

## 2019-08-19 MED ORDER — LACTATED RINGERS IV SOLN: INTRAVENOUS | Status: DC

## 2019-08-19 MED ORDER — FENTANYL CITRATE (PF) 100 MCG/2ML IJ SOLN
INTRAMUSCULAR | Status: AC
  Filled 2019-08-19: qty 2

## 2019-08-19 MED ORDER — NALBUPHINE SYRINGE 5 MG/0.5 ML
5.0000 mg | INJECTION | INTRAMUSCULAR | Status: DC | PRN
  Filled 2019-08-19: qty 0.5

## 2019-08-19 MED ORDER — COCONUT OIL OIL: 1.0000 "application " | TOPICAL_OIL | Status: DC | PRN

## 2019-08-19 MED ORDER — OXYCODONE-ACETAMINOPHEN 5-325 MG PO TABS: 1.0000 | ORAL_TABLET | ORAL | Status: DC | PRN

## 2019-08-19 MED ORDER — ONDANSETRON HCL 4 MG/2ML IJ SOLN
INTRAMUSCULAR | Status: DC | PRN
  Administered 2019-08-19: 4 mg via INTRAVENOUS

## 2019-08-19 MED ORDER — MORPHINE SULFATE (PF) 0.5 MG/ML IJ SOLN
INTRAMUSCULAR | Status: DC | PRN
  Administered 2019-08-19: .1 mg via EPIDURAL

## 2019-08-19 MED ORDER — NALOXONE HCL 4 MG/10ML IJ SOLN
1.0000 ug/kg/h | INTRAVENOUS | Status: DC | PRN
  Filled 2019-08-19: qty 5

## 2019-08-19 MED ORDER — BUPIVACAINE HCL 0.5 % IJ SOLN
INTRAMUSCULAR | Status: DC | PRN
  Administered 2019-08-19: 10 mL

## 2019-08-19 MED ORDER — OXYTOCIN 40 UNITS IN NORMAL SALINE INFUSION - SIMPLE MED
INTRAVENOUS | Status: DC | PRN
  Administered 2019-08-19: 1 mL via INTRAVENOUS
  Administered 2019-08-19: 499 mL via INTRAVENOUS

## 2019-08-19 MED ORDER — BUPIVACAINE HCL (PF) 0.5 % IJ SOLN: 5.0000 mL | Freq: Once | INTRAMUSCULAR | Status: DC

## 2019-08-19 MED ORDER — PRENATAL MULTIVITAMIN CH
1.0000 | ORAL_TABLET | Freq: Every day | ORAL | Status: DC
  Administered 2019-08-19 – 2019-08-21 (×3): 1 via ORAL
  Filled 2019-08-19 (×3): qty 1

## 2019-08-19 MED ORDER — DIPHENHYDRAMINE HCL 50 MG/ML IJ SOLN: 12.5000 mg | INTRAMUSCULAR | Status: DC | PRN

## 2019-08-19 MED ORDER — OXYCODONE HCL 5 MG PO TABS: 5.0000 mg | ORAL_TABLET | ORAL | Status: DC | PRN

## 2019-08-19 MED ORDER — KETOROLAC TROMETHAMINE 30 MG/ML IJ SOLN: 30.0000 mg | Freq: Four times a day (QID) | INTRAMUSCULAR | Status: AC | PRN

## 2019-08-19 MED ORDER — BUPIVACAINE HCL (PF) 0.5 % IJ SOLN
5.0000 mL | Freq: Once | INTRAMUSCULAR | Status: DC
  Filled 2019-08-19: qty 30

## 2019-08-19 MED ORDER — SIMETHICONE 80 MG PO CHEW
80.0000 mg | CHEWABLE_TABLET | Freq: Three times a day (TID) | ORAL | Status: DC
  Administered 2019-08-19 – 2019-08-21 (×5): 80 mg via ORAL
  Filled 2019-08-19 (×6): qty 1

## 2019-08-19 MED ORDER — KETOROLAC TROMETHAMINE 30 MG/ML IJ SOLN
30.0000 mg | Freq: Four times a day (QID) | INTRAMUSCULAR | Status: AC | PRN
  Administered 2019-08-19 – 2019-08-20 (×4): 30 mg via INTRAVENOUS
  Filled 2019-08-19 (×4): qty 1

## 2019-08-19 MED ORDER — DEXAMETHASONE SODIUM PHOSPHATE 4 MG/ML IJ SOLN
INTRAMUSCULAR | Status: DC | PRN
  Administered 2019-08-19: 10 mg via INTRAVENOUS

## 2019-08-19 MED ORDER — NALOXONE HCL 0.4 MG/ML IJ SOLN: 0.4000 mg | INTRAMUSCULAR | Status: DC | PRN

## 2019-08-19 MED ORDER — ONDANSETRON HCL 4 MG/2ML IJ SOLN: 4.0000 mg | Freq: Three times a day (TID) | INTRAMUSCULAR | Status: DC | PRN

## 2019-08-19 MED ORDER — SENNOSIDES-DOCUSATE SODIUM 8.6-50 MG PO TABS
2.0000 | ORAL_TABLET | ORAL | Status: DC
  Administered 2019-08-20 (×2): 2 via ORAL
  Filled 2019-08-19 (×2): qty 2

## 2019-08-19 MED ORDER — DIBUCAINE (PERIANAL) 1 % EX OINT: 1.0000 "application " | TOPICAL_OINTMENT | CUTANEOUS | Status: DC | PRN

## 2019-08-19 MED ORDER — OXYTOCIN 40 UNITS IN NORMAL SALINE INFUSION - SIMPLE MED
INTRAVENOUS | Status: AC
  Filled 2019-08-19: qty 1000

## 2019-08-19 MED ORDER — BUPIVACAINE 0.25 % ON-Q PUMP DUAL CATH 400 ML
400.0000 mL | INJECTION | Status: DC
  Filled 2019-08-19: qty 400

## 2019-08-19 MED ORDER — WITCH HAZEL-GLYCERIN EX PADS: 1.0000 "application " | MEDICATED_PAD | CUTANEOUS | Status: DC | PRN

## 2019-08-19 MED ORDER — MORPHINE SULFATE (PF) 0.5 MG/ML IJ SOLN
INTRAMUSCULAR | Status: AC
  Filled 2019-08-19: qty 10

## 2019-08-19 MED ORDER — SODIUM CHLORIDE 0.9% FLUSH: 3.0000 mL | INTRAVENOUS | Status: DC | PRN

## 2019-08-19 MED ORDER — NALBUPHINE SYRINGE 5 MG/0.5 ML
5.0000 mg | INJECTION | Freq: Once | INTRAMUSCULAR | Status: DC | PRN
  Filled 2019-08-19: qty 0.5

## 2019-08-19 MED ORDER — DIPHENHYDRAMINE HCL 25 MG PO CAPS: 25.0000 mg | ORAL_CAPSULE | ORAL | Status: DC | PRN

## 2019-08-19 MED ORDER — MEASLES, MUMPS & RUBELLA VAC IJ SOLR
0.5000 mL | INTRAMUSCULAR | Status: AC | PRN
  Administered 2019-08-21: 0.5 mL via SUBCUTANEOUS
  Filled 2019-08-19 (×2): qty 0.5

## 2019-08-19 MED ORDER — OXYTOCIN 40 UNITS IN NORMAL SALINE INFUSION - SIMPLE MED
2.5000 [IU]/h | INTRAVENOUS | Status: AC
  Filled 2019-08-19: qty 1000

## 2019-08-19 MED ORDER — BUPIVACAINE IN DEXTROSE 0.75-8.25 % IT SOLN
INTRATHECAL | Status: DC | PRN
  Administered 2019-08-19: 1.7 mL via INTRATHECAL

## 2019-08-19 MED ORDER — MEPERIDINE HCL 50 MG/ML IJ SOLN: 6.2500 mg | INTRAMUSCULAR | Status: DC | PRN

## 2019-08-19 MED ORDER — LACTATED RINGERS IV SOLN
INTRAVENOUS | Status: DC
  Administered 2019-08-19: 06:00:00 via INTRAVENOUS

## 2019-08-19 MED ORDER — FERROUS SULFATE 325 (65 FE) MG PO TABS
325.0000 mg | ORAL_TABLET | Freq: Two times a day (BID) | ORAL | Status: DC
  Administered 2019-08-19 – 2019-08-20 (×3): 325 mg via ORAL
  Filled 2019-08-19 (×4): qty 1

## 2019-08-19 MED ORDER — SOD CITRATE-CITRIC ACID 500-334 MG/5ML PO SOLN
30.0000 mL | ORAL | Status: AC
  Administered 2019-08-19: 30 mL via ORAL
  Filled 2019-08-19: qty 30

## 2019-08-19 MED ORDER — LACTATED RINGERS IV SOLN
INTRAVENOUS | Status: DC | PRN
  Administered 2019-08-19: 08:00:00 via INTRAVENOUS

## 2019-08-19 MED ORDER — IBUPROFEN 600 MG PO TABS
600.0000 mg | ORAL_TABLET | Freq: Four times a day (QID) | ORAL | Status: DC
  Administered 2019-08-20 – 2019-08-21 (×4): 600 mg via ORAL
  Filled 2019-08-19 (×5): qty 1

## 2019-08-19 SURGICAL SUPPLY — 32 items
CANISTER SUCT 3000ML PPV (MISCELLANEOUS) ×3 IMPLANT
CATH KIT ON-Q SILVERSOAK 5 (CATHETERS) ×2 IMPLANT
CATH KIT ON-Q SILVERSOAK 5IN (CATHETERS) ×6 IMPLANT
CLOSURE WOUND 1/2 X4 (GAUZE/BANDAGES/DRESSINGS) ×1
COVER WAND RF STERILE (DRAPES) ×3 IMPLANT
DERMABOND ADVANCED (GAUZE/BANDAGES/DRESSINGS) ×2
DERMABOND ADVANCED .7 DNX12 (GAUZE/BANDAGES/DRESSINGS) ×1 IMPLANT
DRSG OPSITE POSTOP 4X10 (GAUZE/BANDAGES/DRESSINGS) ×3 IMPLANT
DRSG TELFA 3X8 NADH (GAUZE/BANDAGES/DRESSINGS) IMPLANT
ELECT CAUTERY BLADE 6.4 (BLADE) ×3 IMPLANT
ELECT REM PT RETURN 9FT ADLT (ELECTROSURGICAL) ×3
ELECTRODE REM PT RTRN 9FT ADLT (ELECTROSURGICAL) ×1 IMPLANT
GAUZE SPONGE 4X4 12PLY STRL (GAUZE/BANDAGES/DRESSINGS) ×3 IMPLANT
GLOVE BIO SURGEON STRL SZ7 (GLOVE) ×3 IMPLANT
GLOVE INDICATOR 7.5 STRL GRN (GLOVE) ×3 IMPLANT
GOWN STRL REUS W/ TWL LRG LVL3 (GOWN DISPOSABLE) ×3 IMPLANT
GOWN STRL REUS W/TWL LRG LVL3 (GOWN DISPOSABLE) ×6
NS IRRIG 1000ML POUR BTL (IV SOLUTION) ×3 IMPLANT
PACK C SECTION AR (MISCELLANEOUS) ×3 IMPLANT
PAD DRESSING TELFA 3X8 NADH (GAUZE/BANDAGES/DRESSINGS) ×1 IMPLANT
PAD OB MATERNITY 4.3X12.25 (PERSONAL CARE ITEMS) ×6 IMPLANT
PAD PREP 24X41 OB/GYN DISP (PERSONAL CARE ITEMS) ×3 IMPLANT
PENCIL SMOKE ULTRAEVAC 22 CON (MISCELLANEOUS) ×3 IMPLANT
STRIP CLOSURE SKIN 1/2X4 (GAUZE/BANDAGES/DRESSINGS) ×2 IMPLANT
SUT MNCRL 4-0 (SUTURE) ×2
SUT MNCRL 4-0 27XMFL (SUTURE) ×1
SUT PDS AB 1 TP1 96 (SUTURE) ×3 IMPLANT
SUT PLAIN GUT 0 (SUTURE) IMPLANT
SUT VIC AB 0 CTX 36 (SUTURE) ×4
SUT VIC AB 0 CTX36XBRD ANBCTRL (SUTURE) ×2 IMPLANT
SUTURE MNCRL 4-0 27XMF (SUTURE) ×1 IMPLANT
SWABSTK COMLB BENZOIN TINCTURE (MISCELLANEOUS) ×3 IMPLANT

## 2019-08-19 NOTE — Interval H&P Note (Signed)
History and Physical Interval Note:  08/19/2019 7:16 AM  Melissa Coffey  has presented today for surgery, with the diagnosis of Supervision of normal pregnancy, hx of prior LTCS.  The various methods of treatment have been discussed with the patient and family. After consideration of risks, benefits and other options for treatment, the patient has consented to  Procedure(s): [place] (N/A) as a surgical intervention.  The patient's history has been reviewed, patient examined, no change in status, stable for surgery.  I have reviewed the patient's chart and labs.  Questions were answered to the patient's satisfaction.  She confirms that she 100% would like a tubal ligation.  She has not doubts in her mind.  So, will proceed with the above-stated surgery.  Today she notes +FM, no LOF, no VB, and rare contractions.  Prentice Docker, MD, [facility], [facility] 08/19/2019 7:17 AM

## 2019-08-19 NOTE — Anesthesia Post-op Follow-up Note (Signed)
Anesthesia QCDR form completed.        

## 2019-08-19 NOTE — Op Note (Signed)
Cesarean Section Operative Note    Melissa Coffey  MRN: [id number]  DOB: Oct 20, 1996   Surgery date: 08/19/2019   Pre-operative Diagnosis:  1) History of cesarean section, desires repeat 2) Desires permanent sterilization 3) intrauterine pregnancy at [redacted]w[redacted]d    Post-operative Diagnosis:  1) History of cesarean section, desires repeat 2) Desires permanent sterilization 3) intrauterine pregnancy at [redacted]w[redacted]d     Procedure:  1) Repeat Low transverse cesarean section via Pfannenstiel incision with double layer uterine closure 2) bilateral tubal ligation using Pomerory method  Surgeon: Surgeon(s) and Role:    * Conard Novak, MD - Primary    * Nadara Mustard, MD - Assisting   Assistants: Dr. Annamarie Major; No other capable assistant available, in surgery requiring high level assistant.  Anesthesia: spinal   Findings:  1) normal appearing gravid uterus, fallopian tubes, and ovaries 2) viable female infant with weight of 3,440 grams, APGARs 8 and 9   Quantified Blood Loss: 483 mL  Total IV Fluids: 1,500 ml   Urine Output: 200 mL clear urine at end of case  Specimens: portion of left and right fallopian tubes  Complications: no complications  Disposition: PACU - hemodynamically stable.   Maternal Condition: stable   Baby condition / location:  Couplet care / Skin to Skin  Procedure Details:  The patient was seen in the Holding Room. The risks, benefits, complications, treatment options, and expected outcomes were discussed with the patient. The patient concurred with the proposed plan, giving informed consent. identified as Melissa Coffey and the procedure verified as C-Section Delivery. A Time Out was held and the above information confirmed.   After induction of anesthesia, the patient was draped and prepped in the usual sterile manner. A Pfannenstiel incision was made and carried down through the subcutaneous tissue to the fascia. Fascial incision was made and extended  transversely. The fascia was separated from the underlying rectus tissue superiorly and inferiorly. The peritoneum was identified and entered. Peritoneal incision was extended longitudinally. The bladder flap was bluntly and sharply freed from the lower uterine segment. A low transverse uterine incision was made and the hysterotomy was extended with cranial-caudal tension. Delivered from cephalic presentation was a 3,440 gram Living newborn infant(s) or Female with Apgar scores of 8 at one minute and 9 at five minutes. Cord ph was not sent the umbilical cord was clamped and cut cord blood was obtained for evaluation. The placenta was removed Intact and appeared normal. The uterine outline, tubes and ovaries appeared normal. The uterine incision was closed with running locked sutures of 0 Vicryl.  A second layer of the same suture was thrown in an imbricating fashion.  Hemostasis was assured.    The tubal ligation portion of the procedure was performed at this point.  The left fallopian tube was identified and followed out to the fimbriated end.  A Babcock clamp was used to grasp the tube in the mid-isthmic portion and two 2-0 plain gut sutures were used to ligate the tube.  An approximately 3cm segment of tube was removed with hemostasis assured.  The same procedure was performed on the right fallopian tube with hemostasis noted.   The uterus was returned to the abdomen and the paracolic gutters were cleared of all clots and debris.  The rectus muscles were inspected and found to be hemostatic.  The On-Q catheter pumps were inserted in accordance with the manufacturer's recommendations.  The catheters were inserted approximately 4cm cephelad to the incision line,  approximately 1cm apart, straddling the midline.  They were inserted to a depth of the 4th mark. They were positioned superficial to the rectus abdominus muscles and deep to the rectus fascia.    The fascia was then reapproximated with running  sutures of 1-0 PDS, looped. The subcutaneous tissue was re-approximated using 3-0 Vicryl to reduce tension on the skin closure.  The subcuticular closure was performed using 4-0 monocryl. The skin closure was reinforced using surgical skin glue.  The On-Q catheters were bolused with 5 mL of 0.5% marcaine plain for a total of 10 mL.  The catheters were affixed to the skin with surgical skin glue, steri-strips, and tegaderm.    Instrument, sponge, and needle counts were correct prior the abdominal closure and were correct at the conclusion of the case.  The patient received Ancef 2 gram IV prior to skin incision (within 30 minutes). For VTE prophylaxis she was wearing SCDs throughout the case.  The assistant surgeon was an MD due to lack of availability of another Counselling psychologist.   Signed: Will Bonnet, MD 08/19/2019 9:11 AM

## 2019-08-19 NOTE — Transfer of Care (Signed)
Immediate Anesthesia Transfer of Care Note  Patient: Melissa Coffey  Procedure(s) Performed: CESAREAN SECTION WITH BILATERAL TUBAL LIGATION (N/A )  Patient Location: PACU  Anesthesia Type:Spinal  Level of Consciousness: awake, alert  and oriented  Airway & Oxygen Therapy: Patient Spontanous Breathing  Post-op Assessment: Report given to RN and Post -op Vital signs reviewed and stable  Post vital signs: Reviewed and stable  Last Vitals:  Vitals Value Taken Time  BP    Temp    Pulse    Resp    SpO2      Last Pain:  Vitals:   08/19/19 0705  TempSrc:   PainSc: 0-No pain         Complications: No apparent anesthesia complications

## 2019-08-19 NOTE — Anesthesia Procedure Notes (Signed)
Spinal  Patient location during procedure: OR Start time: 08/19/2019 7:50 AM End time: 08/19/2019 7:54 AM Staffing Anesthesiologist: Martha Clan, MD Resident/CRNA: Justus Memory, CRNA Performed: resident/CRNA  Preanesthetic Checklist Completed: patient identified, site marked, surgical consent, pre-op evaluation, timeout performed, IV checked, risks and benefits discussed and monitors and equipment checked Spinal Block Patient position: sitting Prep: ChloraPrep Patient monitoring: heart rate, continuous pulse ox, blood pressure and cardiac monitor Approach: midline Location: L3-4 Injection technique: single-shot Needle Needle type: Whitacre and Introducer  Needle gauge: 24 G Needle length: 9 cm Assessment Sensory level: T6 Additional Notes Negative paresthesia. Negative blood return. Positive free-flowing CSF. Expiration date of kit checked and confirmed. Patient tolerated procedure well, without complications.  Expiration date 08-19-20

## 2019-08-19 NOTE — Discharge Summary (Signed)
OB Discharge Summary     Patient Name: Melissa Coffey DOB: [birth year] MRN: [id number]  Date of admission: 08/19/2019 Delivering MD: Prentice Docker, MD  Date of Delivery: 08/19/2019  Date of discharge: 08/21/2019  Admitting diagnosis: Supervision of normal pregnancy, hx of prior LTCS Intrauterine pregnancy: [redacted]w[redacted]d     Secondary diagnosis: None     Discharge diagnosis: Term Pregnancy Delivered, encounter for sterilization                                                                Post partum procedures:None  Augmentation: n/a  Complications: None  Hospital course:  Scheduled C/S   23 y.o. yo [id number] at [redacted]w[redacted]d was admitted to the hospital 08/19/2019 for scheduled cesarean section with the following indication:Elective Repeat.  She also underwent bilateral tubal ligation.  Membrane Rupture Time/Date: 8:22 AM ,08/19/2019   Patient delivered a Viable infant.08/19/2019  Details of operation can be found in separate operative note.  Pateint had an uncomplicated postpartum course.  She is ambulating, tolerating a regular diet, passing flatus, and urinating well. Patient is discharged home in stable condition on  08/21/19         Physical exam  Vitals:   08/20/19 1615 08/20/19 1950 08/21/19 0047 08/21/19 0751  BP: 110/70  108/74 98/74  Pulse: 77 81 71 65  Resp: 18 14 16 20   Temp: 98 F (36.7 C) 98.7 F (37.1 C) 97.6 F (36.4 C) 97.7 F (36.5 C)  TempSrc:  Oral Oral Oral  SpO2: 99% 99%  99%  Weight:      Height:       General: alert, cooperative and no distress Lochia: appropriate Uterine Fundus: firm Incision: Dressing is clean, dry, and intact DVT Evaluation: No evidence of DVT seen on physical exam.  Labs: Lab Results  Component Value Date   WBC 13.4 (H) 08/20/2019   HGB 9.2 (L) 08/20/2019   HCT 29.7 (L) 08/20/2019   MCV 81.8 08/20/2019   PLT 236 08/20/2019    Discharge instruction: per After Visit Summary.  Medications:  Allergies as of 08/21/2019   No  Known Allergies     Medication List    STOP taking these medications   fluconazole 150 MG tablet Commonly known as: DIFLUCAN     TAKE these medications   multivitamin-prenatal 27-0.8 MG Tabs tablet Take 1 tablet by mouth daily at 12 noon.   oxyCODONE-acetaminophen 5-325 MG tablet Commonly known as: PERCOCET/ROXICET Take 1 tablet by mouth every 6 (six) hours as needed.            Discharge Care Instructions  (From admission, onward)         Start     Ordered   08/21/19 0000  Discharge wound care:    Comments: You may apply a light dressing for minor discharge from the incision or to keep waistbands of clothing from rubbing.  You may also have been discharge with a clear dressing in which case this will be removed at your postoperative clinic visit.  You may shower, use soap on your incision.  Avoid baths or soaking the incision in the first 6 weeks following your surgery..   08/21/19 1941          Diet: routine diet  Activity: Advance as  tolerated. Pelvic rest for 6 weeks.   Outpatient follow up: 1 week postoperative visit with Dr. Jean Rosenthal  Postpartum contraception: Tubal Ligation Rhogam Given postpartum: no Rubella vaccine given postpartum: yes Varicella vaccine given postpartum: no TDaP given antepartum or postpartum: 07/09/2019 Influenza Vaccine: declined  Newborn Data: Live born female  Birth Weight: 7 lb 9.3 oz (3440 g) APGAR: 8, 9  Newborn Delivery   Birth date/time: 08/19/2019 08:23:00 Delivery type: C-Section, Low Transverse Trial of labor: No C-section categorization: Repeat      Baby Feeding: Formula  Disposition: home with mother  SIGNED: [name], CNM 08/21/2019  9:26 AM

## 2019-08-19 NOTE — Anesthesia Preprocedure Evaluation (Signed)
Anesthesia Evaluation  Patient identified by MRN, date of birth, ID band Patient awake    Reviewed: Allergy & Precautions, NPO status , Patient's Chart, lab work & pertinent test results, reviewed documented beta blocker date and time   History of Anesthesia Complications Negative for: history of anesthetic complications  Airway Mallampati: II  TM Distance: >3 FB     Dental  (+) Chipped, Dental Advidsory Given   Pulmonary neg pulmonary ROS, former smoker,    Pulmonary exam normal        Cardiovascular Exercise Tolerance: Good negative cardio ROS Normal cardiovascular exam     Neuro/Psych PSYCHIATRIC DISORDERS Anxiety Depression negative neurological ROS     GI/Hepatic Neg liver ROS, GERD  ,  Endo/Other  negative endocrine ROS  Renal/GU negative Renal ROS     Musculoskeletal   Abdominal   Peds  Hematology  (+) Blood dyscrasia, anemia ,   Anesthesia Other Findings Past Medical History: No date: Anemia No date: Depression No date: Endometriosis 2017: History of blood transfusion     Comment:  after placental abruption 05/2018: Pyelonephritis 05/2018: Sepsis ([place])     Comment:  associated with pyelo No date: UTI (urinary tract infection)   Reproductive/Obstetrics (+) Pregnancy                             Anesthesia Physical  Anesthesia Plan  ASA: II  Anesthesia Plan: Spinal   Post-op Pain Management:    Induction:   PONV Risk Score and Plan:   Airway Management Planned:   Additional Equipment:   Intra-op Plan:   Post-operative Plan:   Informed Consent: I have reviewed the patients History and Physical, chart, labs and discussed the procedure including the risks, benefits and alternatives for the proposed anesthesia with the patient or authorized representative who has indicated his/her understanding and acceptance.       Plan Discussed with: CRNA  Anesthesia  Plan Comments:         Anesthesia Quick Evaluation

## 2019-08-20 LAB — SURGICAL PATHOLOGY

## 2019-08-20 LAB — CBC
HCT: 29.7 % — ABNORMAL LOW (ref 36.0–46.0)
Hemoglobin: 9.2 g/dL — ABNORMAL LOW (ref 12.0–15.0)
MCH: 25.3 pg — ABNORMAL LOW (ref 26.0–34.0)
MCHC: 31 g/dL (ref 30.0–36.0)
MCV: 81.8 fL (ref 80.0–100.0)
Platelets: 236 10*3/uL (ref 150–400)
RBC: 3.63 MIL/uL — ABNORMAL LOW (ref 3.87–5.11)
RDW: 14.4 % (ref 11.5–15.5)
WBC: 13.4 10*3/uL — ABNORMAL HIGH (ref 4.0–10.5)
nRBC: 0 % (ref 0.0–0.2)

## 2019-08-20 NOTE — Anesthesia Postprocedure Evaluation (Signed)
Anesthesia Post Note  Patient: Melissa Coffey  Procedure(s) Performed: CESAREAN SECTION WITH BILATERAL TUBAL LIGATION (N/A )  Patient location during evaluation: Mother Baby Anesthesia Type: Spinal Level of consciousness: oriented and awake and alert Pain management: pain level controlled Vital Signs Assessment: post-procedure vital signs reviewed and stable Respiratory status: spontaneous breathing and respiratory function stable Cardiovascular status: blood pressure returned to baseline and stable Postop Assessment: no headache, no backache, no apparent nausea or vomiting and able to ambulate Anesthetic complications: no     Last Vitals:  Vitals:   08/20/19 0700 08/20/19 0803  BP:  (!) 99/56  Pulse: 64 78  Resp:  18  Temp:  36.9 C  SpO2: 98% 98%    Last Pain:  Vitals:   08/20/19 0803  TempSrc: Oral  PainSc:                  Caryl Asp

## 2019-08-20 NOTE — Progress Notes (Signed)
POD #1 Repeat CS and BTL Subjective:   Feels well. Denies lightheadedness when OOB. Has been voiding without difficulty since foley was discontinued. Bottle feeding  Objective:  Blood pressure 106/66, pulse 80, temperature 98 F (36.7 C), resp. rate 20, height 5' 3" (1.6 m), weight 86.2 kg, last menstrual period 11/19/2018, SpO2 99 %, unknown if currently breastfeeding.  General: pale WF in  NAD Pulmonary: no increased work of breathing/ CTAB Heart: RRR without murmur Abdomen: non-distended, appropriately tender, fundus firm at level of umbilicus-1FB/ML Incision: Honeycomb dressing C&D&I, serosanguinous drainage from ON Q catheters. On Q dressing changed and more Durmabond applied at base of catheters. Extremities: no edema, no erythema, no tenderness  Results for orders placed or performed during the hospital encounter of 08/19/19 (from the past 72 hour(s))  CBC     Status: Abnormal   Collection Time: 08/19/19  5:47 AM  Result Value Ref Range   WBC 14.4 (H) 4.0 - 10.5 K/uL   RBC 3.99 3.87 - 5.11 MIL/uL   Hemoglobin 10.2 (L) 12.0 - 15.0 g/dL   HCT 31.6 (L) 36.0 - 46.0 %   MCV 79.2 (L) 80.0 - 100.0 fL   MCH 25.6 (L) 26.0 - 34.0 pg   MCHC 32.3 30.0 - 36.0 g/dL   RDW 14.3 11.5 - 15.5 %   Platelets 310 150 - 400 K/uL   nRBC 0.0 0.0 - 0.2 %    Comment: Performed at [facility], [address] [place], [place] [address]  RPR     Status: None   Collection Time: 08/19/19  5:47 AM  Result Value Ref Range   RPR Ser Ql NON REACTIVE NON REACTIVE    Comment: Performed at [facility] [address] [place], [place] [address]  Rapid HIV screen (HIV 1/2 Ab+Ag)     Status: None   Collection Time: 08/19/19  5:47 AM  Result Value Ref Range   HIV-1 P24 Antigen - HIV24 NON REACTIVE NON REACTIVE    Comment: (NOTE) Detection of p24 may be inhibited by biotin in the sample, causing false negative results in acute infection.    HIV 1/2 Antibodies NON REACTIVE NON REACTIVE   Interpretation (HIV Ag Ab)      A non reactive test result means that HIV 1 or HIV 2 antibodies and HIV 1 p24 antigen were not detected in the specimen.    Comment: Performed at [facility], [address] [place], [place] [address]  Type and screen [place]     Status: None   Collection Time: 08/19/19  5:47 AM  Result Value Ref Range   ABO/RH(D) O POS    Antibody Screen NEG    Sample Expiration      08/22/2019,2359 Performed at [facility], [address] [place], [place] [address]   Surgical pathology     Status: None   Collection Time: 08/19/19  7:47 AM  Result Value Ref Range   SURGICAL PATHOLOGY      SURGICAL PATHOLOGY CASE: [id number] PATIENT: Albertha Locker Surgical Pathology Report     Specimen Submitted: A. Tube segment, right B. Tube segment, left  Clinical History: Supervision of normal pregnancy, HX of prior LTCS    DIAGNOSIS: A. FALLOPIAN TUBE SEGMENT, RIGHT; STERILIZATION: - FALLOPIAN TUBE WITH NO SIGNIFICANT HISTOPATHOLOGIC CHANGE, SEEN ON FULL CROSS SECTION X3.  B. FALLOPIAN TUBE SEGMENT, LEFT; STERILIZATION: - FALLOPIAN TUBE WITH NO SIGNIFICANT HISTOPATHOLOGIC CHANGE, SEEN ON FULL CROSS SECTION X2.  GROSS DESCRIPTION: A. Labeled: Right tube segment Received: In formalin Integrity: Intact, without fimbriated end Size: 1.2 cm in length and 0.4 cm in diameter Serosa: Pink-tan smooth and shiny On sections: Grossly unremarkable lumen present Perforation: Not present Additional abnormalities: not present Summary of Sections: 1-entirely submitted  B. Labeled: "Left tube segment" Received: In formalin Integrity: Intact, without fimbriated  end Size: 1.0 cm in length and 0.4 cm in diameter Serosa: Pink-tan smooth and shiny On sections: Grossly unremarkable lumen present Perforation: Not present Additional abnormalities: not present Summary of Sections: 1-entirely submitted  Final Diagnosis  performed by Allena Napoleon, MD.   Electronically signed 08/20/2019 12:13:53PM The electronic signature indicates that the named Attending Pathologist has evaluated the specimen Technical component performed at [facility], [address] [place], [place] [address] Lab: [phone] Dir: Rush Farmer, MD, MMM  Professional component performed at [facility], [facility], [address], [place], [place] [address] Lab: [phone] Dir: Dellia Nims. Rubinas, MD   MRSA PCR Screening     Status: None   Collection Time: 08/19/19  1:00 PM  Result Value Ref Range   MRSA by PCR NEGATIVE NEGATIVE    Comment:        The GeneXpert MRSA Assay (FDA approved for NASAL specimens only), is one component of a comprehensive MRSA colonization surveillance program. It is not intended to diagnose MRSA infection nor to guide or monitor treatment for MRSA infections. Performed at [facility], [address] [place], [place] [address]   CBC     Status: Abnormal   Collection Time: 08/20/19  6:48 AM  Result Value Ref Range   WBC 13.4 (H) 4.0 - 10.5 K/uL   RBC 3.63 (L) 3.87 - 5.11 MIL/uL   Hemoglobin 9.2 (L) 12.0 - 15.0 g/dL   HCT 29.7 (L) 36.0 - 46.0 %   MCV 81.8 80.0 - 100.0 fL   MCH 25.3 (L) 26.0 - 34.0 pg   MCHC 31.0 30.0 - 36.0 g/dL   RDW 14.4 11.5 - 15.5 %   Platelets 236 150 - 400 K/uL   nRBC 0.0 0.0 - 0.2 %    Comment: Performed at [facility], [address] [place], [place] [address]     Assessment:   23 y.o. [id number] postoperativeday # 1-stable  Continue postoperative/postpartum care  Ambulate   Plan:  1) Chronic anemia worsened with acute blood loss anemia - hemodynamically stable and asymptomatic - po ferrous sulfate/ vitamins  2) O POS/ RNI/VI: offer MMR upon discharge  3) TDAP -given 07/09/19   4) Bottle/Contraception-BTL  5) Disposition-probable discharge tomorrow.  Dalia Heading, CNM

## 2019-08-20 NOTE — Anesthesia Post-op Follow-up Note (Signed)
  Anesthesia Pain Follow-up Note  Patient: ARANDA BIHM  Day #: 1  Date of Follow-up: 08/20/2019 Time: 8:42 AM  Last Vitals:  Vitals:   08/20/19 0700 08/20/19 0803  BP:  (!) 99/56  Pulse: 64 78  Resp:  18  Temp:  36.9 C  SpO2: 98% 98%    Level of Consciousness: alert  Pain: none   Side Effects:None  Catheter Site Exam:clean, dry, no drainage     Plan: Continue current therapy of postop epidural at surgeon's request  Caryl Asp

## 2019-08-21 MED ORDER — OXYCODONE-ACETAMINOPHEN 5-325 MG PO TABS: 1.0000 | ORAL_TABLET | Freq: Four times a day (QID) | ORAL | 0 refills | Status: DC | PRN

## 2019-08-21 NOTE — Progress Notes (Signed)
Discharge order received from doctor. MMR vaccine given at discharge. Incision cleaning kit given and reviewed. Reviewed discharge instructions and prescriptions with patient and answered all questions. Follow up appointment given. Patient verbalized understanding. ID bands checked. Patient discharged home with infant via wheelchair by nursing/auxillary.    Hilbert Bible, RN

## 2019-08-21 NOTE — Discharge Instructions (Signed)
Please call your doctor or return to the ER if you experience any chest pains, shortness of breath, dizziness, visual changes, severe headache (unrelieved by pain meds), fever greater than 101, any heavy bleeding (saturating more than 1 pad per hour), large clots, or foul smelling discharge, any worsening abdominal pain and cramping that is not controlled by pain medication, any calf/leg pain or redness, any breast concerns (redness/pain), or any signs of postpartum depression. No tampons, enemas, douches, or sexual intercourse for 6 weeks. Also avoid tub baths, hot tubs, or swimming for 6 weeks.    Check your incision daily for any signs of infection such as redness, warmth, swelling, increased pain, or pus/foul smelling drainage  Activity: do not lift over 10 lbs for 6 weeks No driving for 1-2 weeks  Pelvic rest for 6 weeks  

## 2019-08-25 ENCOUNTER — Ambulatory Visit: Payer: Managed Care, Other (non HMO) | Admitting: Obstetrics and Gynecology

## 2020-03-30 ENCOUNTER — Ambulatory Visit: Payer: Medicaid Other | Admitting: Internal Medicine

## 2021-08-30 ENCOUNTER — Emergency Department (HOSPITAL_COMMUNITY)
Admission: EM | Admit: 2021-08-30 | Discharge: 2021-08-30 | Disposition: A | Discharge status: Home / Self Care | Payer: Medicaid Other | Attending: Emergency Medicine | Admitting: Emergency Medicine

## 2021-08-30 ENCOUNTER — Encounter: Payer: Self-pay | Admitting: Oncology

## 2021-08-30 ENCOUNTER — Encounter (HOSPITAL_COMMUNITY): Payer: Self-pay | Admitting: Emergency Medicine

## 2021-08-30 ENCOUNTER — Other Ambulatory Visit: Payer: Self-pay

## 2021-08-30 DIAGNOSIS — R519 Headache, unspecified: Secondary | ICD-10-CM | POA: Diagnosis not present

## 2021-08-30 DIAGNOSIS — Z20822 Contact with and (suspected) exposure to covid-19: Secondary | ICD-10-CM | POA: Diagnosis not present

## 2021-08-30 DIAGNOSIS — R110 Nausea: Secondary | ICD-10-CM | POA: Diagnosis not present

## 2021-08-30 DIAGNOSIS — M542 Cervicalgia: Secondary | ICD-10-CM | POA: Diagnosis not present

## 2021-08-30 DIAGNOSIS — Z5321 Procedure and treatment not carried out due to patient leaving prior to being seen by health care provider: Secondary | ICD-10-CM | POA: Diagnosis not present

## 2021-08-30 DIAGNOSIS — R509 Fever, unspecified: Secondary | ICD-10-CM | POA: Insufficient documentation

## 2021-08-30 LAB — RESP PANEL BY RT-PCR (FLU A&B, COVID) ARPGX2
Influenza A by PCR: NEGATIVE
Influenza B by PCR: NEGATIVE
SARS Coronavirus 2 by RT PCR: NEGATIVE

## 2021-08-30 NOTE — ED Provider Notes (Signed)
Emergency Medicine Provider Triage Evaluation Note  Melissa Coffey , a 25 y.o. female  was evaluated in triage.  Pt complains of headache.  The patient reports she has been having hot and cold episodes all day.  She reports a right-sided frontal headache that also began earlier today.  She states that she has had some aching in her neck and nausea.  No neck stiffness, chest pain, shortness of breath, vomiting, diarrhea, abdominal pain, dysuria, hematuria, visual changes, numbness, weakness.  She is concerned that she may have COVID-19.  No concerns for pregnancy.  Review of Systems  Positive: Headache, subjective fever, chills, neck pain, nausea Negative: Vomiting, weakness, visual changes, dysuria, hematuria, diarrhea, abdominal pain, chest pain, shortness of breath, sore throat, nasal congestion, rhinorrhea, vaginal bleeding or discharge  Physical Exam  There were no vitals taken for this visit. Gen:   Awake, no distress   Resp:  Normal effort  MSK:   Moves extremities without difficulty  Other:  No meningismus  Medical Decision Making  Medically screening exam initiated at 3:05 AM.  Appropriate orders placed.  DAWNELL BRYANT was informed that the remainder of the evaluation will be completed by another provider, this initial triage assessment does not replace that evaluation, and the importance of remaining in the ED until their evaluation is complete.  Labs have been ordered.  She will require further work-up and evaluation in the emergency department.   Barkley Boards, PA-C 08/30/21 [id number]    Shon Baton, MD 08/30/21 [phone]

## 2021-08-30 NOTE — ED Triage Notes (Signed)
Patient states she has been having headaches and neck pain all weekend.  She states she has been having hot flashes with the pain.

## 2021-08-30 NOTE — ED Notes (Signed)
Patient called x3 for vitals recheck with no response 

## 2021-09-06 ENCOUNTER — Other Ambulatory Visit: Payer: Self-pay

## 2021-09-06 ENCOUNTER — Emergency Department
Admission: EM | Admit: 2021-09-06 | Discharge: 2021-09-06 | Disposition: A | Discharge status: Home / Self Care | Payer: Medicaid Other | Attending: Emergency Medicine | Admitting: Emergency Medicine

## 2021-09-06 ENCOUNTER — Emergency Department: Payer: Medicaid Other

## 2021-09-06 DIAGNOSIS — Z20822 Contact with and (suspected) exposure to covid-19: Secondary | ICD-10-CM | POA: Diagnosis not present

## 2021-09-06 DIAGNOSIS — R079 Chest pain, unspecified: Secondary | ICD-10-CM | POA: Insufficient documentation

## 2021-09-06 DIAGNOSIS — J101 Influenza due to other identified influenza virus with other respiratory manifestations: Secondary | ICD-10-CM | POA: Insufficient documentation

## 2021-09-06 DIAGNOSIS — R509 Fever, unspecified: Secondary | ICD-10-CM | POA: Diagnosis present

## 2021-09-06 DIAGNOSIS — Z87891 Personal history of nicotine dependence: Secondary | ICD-10-CM | POA: Diagnosis not present

## 2021-09-06 DIAGNOSIS — E860 Dehydration: Secondary | ICD-10-CM | POA: Insufficient documentation

## 2021-09-06 DIAGNOSIS — N3090 Cystitis, unspecified without hematuria: Secondary | ICD-10-CM | POA: Insufficient documentation

## 2021-09-06 LAB — CBC
HCT: 35.4 % — ABNORMAL LOW (ref 36.0–46.0)
Hemoglobin: 11.3 g/dL — ABNORMAL LOW (ref 12.0–15.0)
MCH: 25.6 pg — ABNORMAL LOW (ref 26.0–34.0)
MCHC: 31.9 g/dL (ref 30.0–36.0)
MCV: 80.3 fL (ref 80.0–100.0)
Platelets: 380 10*3/uL (ref 150–400)
RBC: 4.41 MIL/uL (ref 3.87–5.11)
RDW: 15 % (ref 11.5–15.5)
WBC: 8.8 10*3/uL (ref 4.0–10.5)
nRBC: 0 % (ref 0.0–0.2)

## 2021-09-06 LAB — BASIC METABOLIC PANEL
Anion gap: 9 (ref 5–15)
BUN: 11 mg/dL (ref 6–20)
CO2: 21 mmol/L — ABNORMAL LOW (ref 22–32)
Calcium: 9.2 mg/dL (ref 8.9–10.3)
Chloride: 102 mmol/L (ref 98–111)
Creatinine, Ser: 0.61 mg/dL (ref 0.44–1.00)
GFR, Estimated: >60 mL/min (ref >60)
Glucose, Bld: 114 mg/dL — ABNORMAL HIGH (ref 70–99)
Potassium: 3.6 mmol/L (ref 3.5–5.1)
Sodium: 132 mmol/L — ABNORMAL LOW (ref 135–145)

## 2021-09-06 LAB — URINALYSIS, ROUTINE W REFLEX MICROSCOPIC
Bilirubin Urine: NEGATIVE
Glucose, UA: NEGATIVE mg/dL
Hgb urine dipstick: NEGATIVE
Ketones, ur: NEGATIVE mg/dL
Nitrite: POSITIVE — AB
Protein, ur: NEGATIVE mg/dL
Specific Gravity, Urine: 1.012 (ref 1.005–1.030)
pH: 6 (ref 5.0–8.0)

## 2021-09-06 LAB — TROPONIN I (HIGH SENSITIVITY)
Troponin I (High Sensitivity): 5 ng/L (ref <18)
Troponin I (High Sensitivity): 3 ng/L (ref <18)

## 2021-09-06 LAB — POC URINE PREG, ED: Preg Test, Ur: NEGATIVE

## 2021-09-06 LAB — RESP PANEL BY RT-PCR (FLU A&B, COVID) ARPGX2
Influenza A by PCR: POSITIVE — AB
Influenza B by PCR: NEGATIVE
SARS Coronavirus 2 by RT PCR: NEGATIVE

## 2021-09-06 MED ORDER — ONDANSETRON 8 MG PO TBDP
8.0000 mg | ORAL_TABLET | Freq: Once | ORAL | Status: AC
  Administered 2021-09-06: 8 mg via ORAL
  Filled 2021-09-06: qty 1

## 2021-09-06 MED ORDER — ACETAMINOPHEN 325 MG PO TABS
650.0000 mg | ORAL_TABLET | Freq: Once | ORAL | Status: AC
  Administered 2021-09-06: 650 mg via ORAL
  Filled 2021-09-06: qty 2

## 2021-09-06 MED ORDER — ONDANSETRON 4 MG PO TBDP: 4.0000 mg | ORAL_TABLET | Freq: Three times a day (TID) | ORAL | 0 refills | Status: DC | PRN

## 2021-09-06 MED ORDER — KETOROLAC TROMETHAMINE 30 MG/ML IJ SOLN
15.0000 mg | INTRAMUSCULAR | Status: AC
  Administered 2021-09-06: 15 mg via INTRAMUSCULAR
  Filled 2021-09-06: qty 1

## 2021-09-06 MED ORDER — NAPROXEN 500 MG PO TABS: 500.0000 mg | ORAL_TABLET | Freq: Two times a day (BID) | ORAL | 0 refills | Status: DC

## 2021-09-06 MED ORDER — CEPHALEXIN 500 MG PO CAPS: 500.0000 mg | ORAL_CAPSULE | Freq: Three times a day (TID) | ORAL | 0 refills | Status: DC

## 2021-09-06 MED ORDER — FAMOTIDINE 20 MG PO TABS: 20.0000 mg | ORAL_TABLET | Freq: Two times a day (BID) | ORAL | 0 refills | Status: DC

## 2021-09-06 NOTE — ED Triage Notes (Signed)
Pt presents to ER c/o chest pain, sob, fever and body aches that started initially a week ago and had become a little better.  Pt states that tonight her symptoms became worse again.  Pt states she went to [facility] on 11/1, but left before being seen.  Pt A&O x4 at this time.  Pt does appear to be hyperventilating in triage. Pt states she has not had any vomiting or diarrhea but has been nauseous.

## 2021-09-06 NOTE — ED Notes (Signed)
See triage note presents with body aches and fever  states sxs' started couple of days ago  low grade temp on arrival   also having some discomfort in chest

## 2021-09-06 NOTE — ED Provider Notes (Signed)
[facility] Emergency Department Provider Note  ____________________________________________  Time seen: Approximately 9:29 AM  I have reviewed the triage vital signs and the nursing notes.   HISTORY  Chief Complaint Chest Pain    HPI Melissa Coffey is a 25 y.o. female with a past history of endometriosis, UTI who comes ED complaining of body aches, fevers chills, malaise, nausea, headache for the past 5 days.  Significant other in the room notes that he is also had congestion and mild myalgias for the past 2 days.  He had a flu shot 3 weeks ago.  Symptoms have been constant for the patient, waxing waning, no aggravating or alleviating factors.    Past Medical History:  Diagnosis Date   Anemia    Depression    Endometriosis    History of blood transfusion 2017   after placental abruption   Pyelonephritis 05/2018   Sepsis (HCC) 05/2018   associated with pyelo   UTI (urinary tract infection)      Patient Active Problem List   Diagnosis Date Noted   History of cesarean delivery 08/19/2019   Encounter for sterilization 08/19/2019   Supervision of other normal pregnancy, antepartum 01/03/2019   Sepsis (HCC) 06/22/2018   Compulsive skin picking 11/30/2017   Anxiety and depression 11/29/2017   Previous cesarean delivery, antepartum condition or complication 05/09/2016   Status post cesarean delivery 05/09/2016   Anemia in pregnancy 11/11/2015     Past Surgical History:  Procedure Laterality Date   ABDOMINAL SURGERY     edemetriosis  Exploratory lap   CESAREAN SECTION  02/2014   pLTCS. FITL at term   CESAREAN SECTION N/A 05/09/2016   Procedure: CESAREAN SECTION;  Surgeon: Conard Novak, MD;  Location: [facility] ORS;  Service: Obstetrics;  Laterality: N/A;   CESAREAN SECTION WITH BILATERAL TUBAL LIGATION N/A 08/19/2019   Procedure: CESAREAN SECTION WITH BILATERAL TUBAL LIGATION;  Surgeon: Conard Novak, MD;  Location: [facility] ORS;  Service:  Obstetrics;  Laterality: N/A;   NECK SURGERY  2003   TONSILLECTOMY       Prior to Admission medications   Medication Sig Start Date End Date Taking? Authorizing Provider  famotidine (PEPCID) 20 MG tablet Take 1 tablet (20 mg total) by mouth 2 (two) times daily. 09/06/21  Yes Sharman Cheek, MD  naproxen (NAPROSYN) 500 MG tablet Take 1 tablet (500 mg total) by mouth 2 (two) times daily with a meal. 09/06/21  Yes Sharman Cheek, MD  ondansetron (ZOFRAN ODT) 4 MG disintegrating tablet Take 1 tablet (4 mg total) by mouth every 8 (eight) hours as needed for nausea or vomiting. 09/06/21  Yes Sharman Cheek, MD     Allergies Patient has no known allergies.   Family History  Problem Relation Age of Onset   Diabetes Maternal Grandmother    Hypertension Maternal Grandmother    Cancer Maternal Grandmother    Breast cancer Maternal Grandmother    Diabetes Maternal Uncle    Healthy Mother    Healthy Brother    Healthy Daughter    Healthy Son    Healthy Brother    Stroke Neg Hx    Heart attack Neg Hx    Colon cancer Neg Hx    Ovarian cancer Neg Hx     Social History Social History   Tobacco Use   Smoking status: Former    Packs/day: 1.00    Years: 0.50    Pack years: 0.50    Types: Cigarettes  Quit date: 12/03/2018    Years since quitting: 2.7   Smokeless tobacco: Never   Tobacco comments:    quit w/+pregnancy test  Vaping Use   Vaping Use: Never used  Substance Use Topics   Alcohol use: No   Drug use: Not Currently    Review of Systems  Constitutional: Positive fever and chills.  Positive diffuse body aches ENT:   Positive sore throat.  Positive rhinorrhea. Cardiovascular:   No chest pain or syncope. Respiratory:   No dyspnea positive nonproductive cough. Gastrointestinal:   Negative for abdominal pain, vomiting and diarrhea.  Musculoskeletal:   Negative for focal pain or swelling All other systems reviewed and are negative except as documented above in ROS  and HPI.  ____________________________________________   PHYSICAL EXAM:  VITAL SIGNS: ED Triage Vitals  Enc Vitals Group     BP 09/06/21 0351 115/75     Pulse Rate 09/06/21 0351 (!) 124     Resp 09/06/21 0351 (!) 30     Temp 09/06/21 0351 100 F (37.8 C)     Temp Source 09/06/21 0351 Oral     SpO2 09/06/21 0351 99 %     Weight 09/06/21 0351 140 lb (63.5 kg)     Height 09/06/21 0351 5\' 4"  (1.626 m)     Head Circumference --      Peak Flow --      Pain Score 09/06/21 0356 9     Pain Loc --      Pain Edu? --      Excl. in GC? --     Vital signs reviewed, nursing assessments reviewed.   Constitutional:   Alert and oriented. Non-toxic appearance. Eyes:   Conjunctivae are normal. EOMI. PERRL. ENT      Head:   Normocephalic and atraumatic.      Nose:   Wearing a mask.      Mouth/Throat:   Wearing a mask.      Neck:   No meningismus. Full ROM. Hematological/Lymphatic/Immunilogical:   No cervical lymphadenopathy. Cardiovascular:   RRR. Symmetric bilateral radial and DP pulses.  No murmurs. Cap refill less than 2 seconds. Respiratory:   Normal respiratory effort without tachypnea/retractions. Breath sounds are clear and equal bilaterally. No wheezes/rales/rhonchi. Gastrointestinal:   Soft and nontender. Non distended. There is no CVA tenderness.  No rebound, rigidity, or guarding. Genitourinary:   deferred Musculoskeletal:   Normal range of motion in all extremities. No joint effusions.  No lower extremity tenderness.  No edema. Neurologic:   Normal speech and language.  Motor grossly intact. No acute focal neurologic deficits are appreciated.  Skin:    Skin is warm, dry and intact. No rash noted.  No petechiae, purpura, or bullae.  ____________________________________________    LABS (pertinent positives/negatives) (all labs ordered are listed, but only abnormal results are displayed) Labs Reviewed  RESP PANEL BY RT-PCR (FLU A&B, COVID) ARPGX2 - Abnormal; Notable for the  following components:      Result Value   Influenza A by PCR POSITIVE (*)    All other components within normal limits  BASIC METABOLIC PANEL - Abnormal; Notable for the following components:   Sodium 132 (*)    CO2 21 (*)    Glucose, Bld 114 (*)    All other components within normal limits  CBC - Abnormal; Notable for the following components:   Hemoglobin 11.3 (*)    HCT 35.4 (*)    MCH 25.6 (*)    All other components  within normal limits  URINALYSIS, ROUTINE W REFLEX MICROSCOPIC  POC URINE PREG, ED  TROPONIN I (HIGH SENSITIVITY)  TROPONIN I (HIGH SENSITIVITY)   ____________________________________________   EKG    ____________________________________________    RADIOLOGY  DG Chest 2 View  Result Date: 09/06/2021 CLINICAL DATA:  Chest pain and shortness of breath EXAM: CHEST - 2 VIEW COMPARISON:  06/22/2018 FINDINGS: Normal heart size and mediastinal contours. No acute infiltrate or edema. No effusion or pneumothorax. No acute osseous findings. Mild pectus excavatum. IMPRESSION: No active cardiopulmonary disease. Electronically Signed   By: Tiburcio Pea M.D.   On: 09/06/2021 04:40    ____________________________________________   PROCEDURES Procedures  ____________________________________________    CLINICAL IMPRESSION / ASSESSMENT AND PLAN / ED COURSE  Medications ordered in the ED: Medications  acetaminophen (TYLENOL) tablet 650 mg (650 mg Oral Given 09/06/21 0402)  ondansetron (ZOFRAN-ODT) disintegrating tablet 8 mg (8 mg Oral Given 09/06/21 0833)  ketorolac (TORADOL) 30 MG/ML injection 15 mg (15 mg Intramuscular Given 09/06/21 6389)    Pertinent labs & imaging results that were available during my care of the patient were reviewed by me and considered in my medical decision making (see chart for details).  Melissa Coffey was evaluated in Emergency Department on 09/06/2021 for the symptoms described in the history of present illness. She was evaluated in  the context of the global COVID-19 pandemic, which necessitated consideration that the patient might be at risk for infection with the SARS-CoV-2 virus that causes COVID-19. Institutional protocols and algorithms that pertain to the evaluation of patients at risk for COVID-19 are in a state of rapid change based on information released by regulatory bodies including the CDC and federal and state organizations. These policies and algorithms were followed during the patient's care in the ED.   Patient presents with multiple constitutional symptoms.  Viral swab is positive for influenza A.  Exam is otherwise reassuring although I suspect some mild degree of dehydration.  Patient given Zofran and she is tolerating oral intake.  Vital signs are unremarkable.  Serum labs unremarkable.  Patient is nontoxic and stable for outpatient management.  Not a candidate for Tamiflu.  Will follow-up urinalysis, send antibiotics to her pharmacy as needed.      ____________________________________________   FINAL CLINICAL IMPRESSION(S) / ED DIAGNOSES    Final diagnoses:  Influenza A  Dehydration     ED Discharge Orders          Ordered    naproxen (NAPROSYN) 500 MG tablet  2 times daily with meals        09/06/21 0929    ondansetron (ZOFRAN ODT) 4 MG disintegrating tablet  Every 8 hours PRN        09/06/21 0929    famotidine (PEPCID) 20 MG tablet  2 times daily        09/06/21 3734            Portions of this note were generated with dragon dictation software. Dictation errors may occur despite best attempts at proofreading.    Sharman Cheek, MD 09/06/21 1106

## 2022-04-09 ENCOUNTER — Emergency Department: Payer: Commercial Managed Care - HMO

## 2022-04-09 ENCOUNTER — Inpatient Hospital Stay: Payer: Commercial Managed Care - HMO | Admitting: Anesthesiology

## 2022-04-09 ENCOUNTER — Other Ambulatory Visit: Payer: Self-pay

## 2022-04-09 ENCOUNTER — Encounter
Admission: EM | Disposition: A | Discharge status: Home / Self Care | Payer: Self-pay | Source: Home / Self Care | Attending: Internal Medicine

## 2022-04-09 ENCOUNTER — Inpatient Hospital Stay
Admission: EM | Admit: 2022-04-09 | Discharge: 2022-04-12 | DRG: 853 | Disposition: A | Discharge status: Home / Self Care | Payer: Commercial Managed Care - HMO | Attending: Family Medicine | Admitting: Family Medicine

## 2022-04-09 DIAGNOSIS — F17210 Nicotine dependence, cigarettes, uncomplicated: Secondary | ICD-10-CM | POA: Diagnosis present

## 2022-04-09 DIAGNOSIS — N10 Acute tubulo-interstitial nephritis: Secondary | ICD-10-CM | POA: Diagnosis present

## 2022-04-09 DIAGNOSIS — Z8249 Family history of ischemic heart disease and other diseases of the circulatory system: Secondary | ICD-10-CM | POA: Diagnosis not present

## 2022-04-09 DIAGNOSIS — K562 Volvulus: Secondary | ICD-10-CM | POA: Diagnosis not present

## 2022-04-09 DIAGNOSIS — F32A Depression, unspecified: Secondary | ICD-10-CM | POA: Diagnosis present

## 2022-04-09 DIAGNOSIS — D62 Acute posthemorrhagic anemia: Secondary | ICD-10-CM | POA: Diagnosis present

## 2022-04-09 DIAGNOSIS — K561 Intussusception: Secondary | ICD-10-CM | POA: Diagnosis present

## 2022-04-09 DIAGNOSIS — A419 Sepsis, unspecified organism: Secondary | ICD-10-CM | POA: Diagnosis not present

## 2022-04-09 DIAGNOSIS — Z803 Family history of malignant neoplasm of breast: Secondary | ICD-10-CM

## 2022-04-09 DIAGNOSIS — A4151 Sepsis due to Escherichia coli [E. coli]: Principal | ICD-10-CM | POA: Diagnosis present

## 2022-04-09 DIAGNOSIS — N12 Tubulo-interstitial nephritis, not specified as acute or chronic: Secondary | ICD-10-CM | POA: Diagnosis not present

## 2022-04-09 DIAGNOSIS — F17200 Nicotine dependence, unspecified, uncomplicated: Secondary | ICD-10-CM

## 2022-04-09 DIAGNOSIS — Z20822 Contact with and (suspected) exposure to covid-19: Secondary | ICD-10-CM | POA: Diagnosis present

## 2022-04-09 DIAGNOSIS — Z833 Family history of diabetes mellitus: Secondary | ICD-10-CM | POA: Diagnosis not present

## 2022-04-09 HISTORY — PX: XI ROBOTIC ASSISTED SMALL BOWEL RESECTION: SHX6872

## 2022-04-09 LAB — COMPREHENSIVE METABOLIC PANEL
ALT: 12 U/L (ref 0–44)
AST: 18 U/L (ref 15–41)
Albumin: 4.1 g/dL (ref 3.5–5.0)
Alkaline Phosphatase: 69 U/L (ref 38–126)
Anion gap: 7 (ref 5–15)
BUN: 11 mg/dL (ref 6–20)
CO2: 24 mmol/L (ref 22–32)
Calcium: 9.2 mg/dL (ref 8.9–10.3)
Chloride: 103 mmol/L (ref 98–111)
Creatinine, Ser: 0.53 mg/dL (ref 0.44–1.00)
GFR, Estimated: >60 mL/min (ref >60)
Glucose, Bld: 106 mg/dL — ABNORMAL HIGH (ref 70–99)
Potassium: 3.8 mmol/L (ref 3.5–5.1)
Sodium: 134 mmol/L — ABNORMAL LOW (ref 135–145)
Total Bilirubin: 0.8 mg/dL (ref 0.3–1.2)
Total Protein: 7.5 g/dL (ref 6.5–8.1)

## 2022-04-09 LAB — CBC WITH DIFFERENTIAL/PLATELET
Abs Immature Granulocytes: 0.08 10*3/uL — ABNORMAL HIGH (ref 0.00–0.07)
Basophils Absolute: 0.1 10*3/uL (ref 0.0–0.1)
Basophils Relative: 0 %
Eosinophils Absolute: 0 10*3/uL (ref 0.0–0.5)
Eosinophils Relative: 0 %
HCT: 38.3 % (ref 36.0–46.0)
Hemoglobin: 12.1 g/dL (ref 12.0–15.0)
Immature Granulocytes: 1 %
Lymphocytes Relative: 7 %
Lymphs Abs: 1.1 10*3/uL (ref 0.7–4.0)
MCH: 25.1 pg — ABNORMAL LOW (ref 26.0–34.0)
MCHC: 31.6 g/dL (ref 30.0–36.0)
MCV: 79.3 fL — ABNORMAL LOW (ref 80.0–100.0)
Monocytes Absolute: 1.2 10*3/uL — ABNORMAL HIGH (ref 0.1–1.0)
Monocytes Relative: 7 %
Neutro Abs: 14.1 10*3/uL — ABNORMAL HIGH (ref 1.7–7.7)
Neutrophils Relative %: 85 %
Platelets: 319 10*3/uL (ref 150–400)
RBC: 4.83 MIL/uL (ref 3.87–5.11)
RDW: 16 % — ABNORMAL HIGH (ref 11.5–15.5)
WBC: 16.5 10*3/uL — ABNORMAL HIGH (ref 4.0–10.5)
nRBC: 0 % (ref 0.0–0.2)

## 2022-04-09 LAB — URINALYSIS, COMPLETE (UACMP) WITH MICROSCOPIC
Bilirubin Urine: NEGATIVE
Glucose, UA: NEGATIVE mg/dL
Ketones, ur: NEGATIVE mg/dL
Nitrite: POSITIVE — AB
Protein, ur: NEGATIVE mg/dL
Specific Gravity, Urine: 1.03 (ref 1.005–1.030)
WBC, UA: >50 WBC/hpf — ABNORMAL HIGH (ref 0–5)
pH: 6 (ref 5.0–8.0)

## 2022-04-09 LAB — LACTIC ACID, PLASMA
Lactic Acid, Venous: 1.6 mmol/L (ref 0.5–1.9)
Lactic Acid, Venous: 1.3 mmol/L (ref 0.5–1.9)

## 2022-04-09 LAB — PROTIME-INR
INR: 1 (ref 0.8–1.2)
Prothrombin Time: 13.2 seconds (ref 11.4–15.2)

## 2022-04-09 LAB — PROCALCITONIN: Procalcitonin: <0.1 ng/mL

## 2022-04-09 LAB — RESP PANEL BY RT-PCR (FLU A&B, COVID) ARPGX2
Influenza A by PCR: NEGATIVE
Influenza B by PCR: NEGATIVE
SARS Coronavirus 2 by RT PCR: NEGATIVE

## 2022-04-09 LAB — HCG, QUANTITATIVE, PREGNANCY: hCG, Beta Chain, Quant, S: <1 m[IU]/mL (ref <5)

## 2022-04-09 LAB — APTT: aPTT: 32 seconds (ref 24–36)

## 2022-04-09 SURGERY — EXCISION, SMALL INTESTINE, ROBOT-ASSISTED
Anesthesia: General | Site: Abdomen

## 2022-04-09 MED ORDER — LIDOCAINE HCL (PF) 2 % IJ SOLN
INTRAMUSCULAR | Status: AC
  Filled 2022-04-09: qty 5

## 2022-04-09 MED ORDER — ACETAMINOPHEN 650 MG RE SUPP: 650.0000 mg | Freq: Four times a day (QID) | RECTAL | Status: DC | PRN

## 2022-04-09 MED ORDER — PROPOFOL 10 MG/ML IV BOLUS
INTRAVENOUS | Status: DC | PRN
  Administered 2022-04-09: 120 mg via INTRAVENOUS

## 2022-04-09 MED ORDER — KETAMINE HCL 10 MG/ML IJ SOLN
INTRAMUSCULAR | Status: DC | PRN
  Administered 2022-04-09: 10 mg via INTRAVENOUS
  Administered 2022-04-09 (×2): 20 mg via INTRAVENOUS

## 2022-04-09 MED ORDER — HYDROMORPHONE HCL 1 MG/ML IJ SOLN
0.5000 mg | Freq: Once | INTRAMUSCULAR | Status: AC
  Administered 2022-04-09: 0.5 mg via INTRAVENOUS
  Filled 2022-04-09: qty 0.5

## 2022-04-09 MED ORDER — ROCURONIUM BROMIDE 100 MG/10ML IV SOLN
INTRAVENOUS | Status: DC | PRN
  Administered 2022-04-09: 20 mg via INTRAVENOUS
  Administered 2022-04-09: 70 mg via INTRAVENOUS
  Administered 2022-04-09: 30 mg via INTRAVENOUS

## 2022-04-09 MED ORDER — SODIUM CHLORIDE 0.9 % IV BOLUS
1000.0000 mL | Freq: Once | INTRAVENOUS | Status: AC
  Administered 2022-04-09: 1000 mL via INTRAVENOUS

## 2022-04-09 MED ORDER — ONDANSETRON HCL 4 MG/2ML IJ SOLN
4.0000 mg | Freq: Once | INTRAMUSCULAR | Status: AC
  Administered 2022-04-09: 4 mg via INTRAVENOUS
  Filled 2022-04-09: qty 2

## 2022-04-09 MED ORDER — SODIUM CHLORIDE 0.9 % IV SOLN
1.0000 g | INTRAVENOUS | Status: DC
  Administered 2022-04-10: 1 g via INTRAVENOUS
  Filled 2022-04-09 (×2): qty 10

## 2022-04-09 MED ORDER — PROPOFOL 10 MG/ML IV BOLUS
INTRAVENOUS | Status: AC
  Filled 2022-04-09: qty 20

## 2022-04-09 MED ORDER — ALBUMIN HUMAN 5 % IV SOLN: INTRAVENOUS | Status: DC | PRN

## 2022-04-09 MED ORDER — ENOXAPARIN SODIUM 40 MG/0.4ML IJ SOSY
40.0000 mg | PREFILLED_SYRINGE | INTRAMUSCULAR | Status: DC
  Administered 2022-04-09 – 2022-04-11 (×3): 40 mg via SUBCUTANEOUS
  Filled 2022-04-09 (×3): qty 0.4

## 2022-04-09 MED ORDER — FENTANYL CITRATE (PF) 100 MCG/2ML IJ SOLN
INTRAMUSCULAR | Status: AC
  Filled 2022-04-09: qty 2

## 2022-04-09 MED ORDER — MIDAZOLAM HCL 2 MG/2ML IJ SOLN
INTRAMUSCULAR | Status: DC | PRN
  Administered 2022-04-09: 2 mg via INTRAVENOUS

## 2022-04-09 MED ORDER — HYDROMORPHONE HCL 1 MG/ML IJ SOLN: 0.2500 mg | INTRAMUSCULAR | Status: DC | PRN

## 2022-04-09 MED ORDER — DEXAMETHASONE SODIUM PHOSPHATE 10 MG/ML IJ SOLN
INTRAMUSCULAR | Status: AC
  Filled 2022-04-09: qty 1

## 2022-04-09 MED ORDER — IOHEXOL 300 MG/ML  SOLN
100.0000 mL | Freq: Once | INTRAMUSCULAR | Status: AC | PRN
  Administered 2022-04-09: 100 mL via INTRAVENOUS

## 2022-04-09 MED ORDER — ONDANSETRON HCL 4 MG/2ML IJ SOLN
INTRAMUSCULAR | Status: DC | PRN
  Administered 2022-04-09: 4 mg via INTRAVENOUS

## 2022-04-09 MED ORDER — LACTATED RINGERS IV SOLN: INTRAVENOUS | Status: DC

## 2022-04-09 MED ORDER — ACETAMINOPHEN 325 MG PO TABS: 650.0000 mg | ORAL_TABLET | Freq: Four times a day (QID) | ORAL | Status: DC | PRN

## 2022-04-09 MED ORDER — BUPIVACAINE-EPINEPHRINE (PF) 0.25% -1:200000 IJ SOLN
INTRAMUSCULAR | Status: DC | PRN
  Administered 2022-04-09: 50 mL via SURGICAL_CAVITY

## 2022-04-09 MED ORDER — LIDOCAINE HCL (CARDIAC) PF 100 MG/5ML IV SOSY
PREFILLED_SYRINGE | INTRAVENOUS | Status: DC | PRN
  Administered 2022-04-09: 100 mg via INTRAVENOUS

## 2022-04-09 MED ORDER — MIDAZOLAM HCL 2 MG/2ML IJ SOLN
INTRAMUSCULAR | Status: AC
  Filled 2022-04-09: qty 2

## 2022-04-09 MED ORDER — SODIUM CHLORIDE 0.9 % IV SOLN
2.0000 g | Freq: Once | INTRAVENOUS | Status: AC
  Administered 2022-04-09: 2 g via INTRAVENOUS
  Filled 2022-04-09: qty 20

## 2022-04-09 MED ORDER — CHLORHEXIDINE GLUCONATE CLOTH 2 % EX PADS
6.0000 | MEDICATED_PAD | Freq: Every day | CUTANEOUS | Status: DC
Start: 2022-04-10 — End: 2022-04-10

## 2022-04-09 MED ORDER — KETOROLAC TROMETHAMINE 30 MG/ML IJ SOLN
INTRAMUSCULAR | Status: AC
  Filled 2022-04-09: qty 1

## 2022-04-09 MED ORDER — DEXAMETHASONE SODIUM PHOSPHATE 10 MG/ML IJ SOLN
INTRAMUSCULAR | Status: DC | PRN
  Administered 2022-04-09: 10 mg via INTRAVENOUS

## 2022-04-09 MED ORDER — ONDANSETRON HCL 4 MG/2ML IJ SOLN: 4.0000 mg | Freq: Four times a day (QID) | INTRAMUSCULAR | Status: DC | PRN

## 2022-04-09 MED ORDER — ROCURONIUM BROMIDE 10 MG/ML (PF) SYRINGE
PREFILLED_SYRINGE | INTRAVENOUS | Status: AC
  Filled 2022-04-09: qty 10

## 2022-04-09 MED ORDER — METOPROLOL TARTRATE 5 MG/5ML IV SOLN
INTRAVENOUS | Status: DC | PRN
  Administered 2022-04-09 (×3): 1 mg via INTRAVENOUS

## 2022-04-09 MED ORDER — KETAMINE HCL 50 MG/5ML IJ SOSY
PREFILLED_SYRINGE | INTRAMUSCULAR | Status: AC
  Filled 2022-04-09: qty 5

## 2022-04-09 MED ORDER — SUGAMMADEX SODIUM 200 MG/2ML IV SOLN
INTRAVENOUS | Status: DC | PRN
  Administered 2022-04-09: 200 mg via INTRAVENOUS

## 2022-04-09 MED ORDER — SUCCINYLCHOLINE CHLORIDE 200 MG/10ML IV SOSY
PREFILLED_SYRINGE | INTRAVENOUS | Status: AC
  Filled 2022-04-09: qty 10

## 2022-04-09 MED ORDER — ACETAMINOPHEN 10 MG/ML IV SOLN
INTRAVENOUS | Status: DC | PRN
  Administered 2022-04-09: 1000 mg via INTRAVENOUS

## 2022-04-09 MED ORDER — 0.9 % SODIUM CHLORIDE (POUR BTL) OPTIME
TOPICAL | Status: DC | PRN
  Administered 2022-04-09: 500 mL

## 2022-04-09 MED ORDER — KETOROLAC TROMETHAMINE 30 MG/ML IJ SOLN
INTRAMUSCULAR | Status: DC | PRN
  Administered 2022-04-09: 30 mg via INTRAVENOUS

## 2022-04-09 MED ORDER — METRONIDAZOLE 500 MG/100ML IV SOLN
500.0000 mg | Freq: Two times a day (BID) | INTRAVENOUS | Status: DC
  Administered 2022-04-09 – 2022-04-10 (×2): 500 mg via INTRAVENOUS
  Filled 2022-04-09 (×3): qty 100

## 2022-04-09 MED ORDER — LIDOCAINE HCL (PF) 2 % IJ SOLN
INTRAMUSCULAR | Status: DC | PRN
  Administered 2022-04-09: 1.5 mg/kg/h via INTRADERMAL
  Administered 2022-04-09: 20 mg via INTRADERMAL

## 2022-04-09 MED ORDER — ONDANSETRON HCL 4 MG/2ML IJ SOLN
INTRAMUSCULAR | Status: AC
  Filled 2022-04-09: qty 2

## 2022-04-09 MED ORDER — PROMETHAZINE HCL 25 MG/ML IJ SOLN: 6.2500 mg | INTRAMUSCULAR | Status: DC | PRN

## 2022-04-09 MED ORDER — KETOROLAC TROMETHAMINE 30 MG/ML IJ SOLN
30.0000 mg | Freq: Four times a day (QID) | INTRAMUSCULAR | Status: DC
  Administered 2022-04-09 – 2022-04-12 (×10): 30 mg via INTRAVENOUS
  Filled 2022-04-09 (×10): qty 1

## 2022-04-09 MED ORDER — ACETAMINOPHEN 10 MG/ML IV SOLN
INTRAVENOUS | Status: AC
  Filled 2022-04-09: qty 100

## 2022-04-09 MED ORDER — ACETAMINOPHEN 500 MG PO TABS
1000.0000 mg | ORAL_TABLET | Freq: Four times a day (QID) | ORAL | Status: DC
  Administered 2022-04-09 – 2022-04-12 (×8): 1000 mg via ORAL
  Filled 2022-04-09 (×9): qty 2

## 2022-04-09 MED ORDER — ONDANSETRON HCL 4 MG PO TABS: 4.0000 mg | ORAL_TABLET | Freq: Four times a day (QID) | ORAL | Status: DC | PRN

## 2022-04-09 MED ORDER — DROPERIDOL 2.5 MG/ML IJ SOLN: 0.6250 mg | Freq: Once | INTRAMUSCULAR | Status: DC | PRN

## 2022-04-09 MED ORDER — FENTANYL CITRATE (PF) 100 MCG/2ML IJ SOLN
INTRAMUSCULAR | Status: DC | PRN
  Administered 2022-04-09: 25 ug via INTRAVENOUS
  Administered 2022-04-09: 50 ug via INTRAVENOUS
  Administered 2022-04-09: 25 ug via INTRAVENOUS

## 2022-04-09 MED ORDER — METOPROLOL TARTRATE 5 MG/5ML IV SOLN
INTRAVENOUS | Status: AC
  Filled 2022-04-09: qty 5

## 2022-04-09 MED ORDER — LIDOCAINE HCL (PF) 2 % IJ SOLN
INTRAMUSCULAR | Status: AC
  Filled 2022-04-09: qty 15

## 2022-04-09 MED ORDER — ALBUMIN HUMAN 5 % IV SOLN
INTRAVENOUS | Status: AC
  Filled 2022-04-09: qty 250

## 2022-04-09 MED ORDER — STERILE WATER FOR INJECTION IV SOLN
INTRAMUSCULAR | Status: DC | PRN
  Administered 2022-04-09 (×2): 2 mL

## 2022-04-09 MED ORDER — PHENYLEPHRINE HCL (PRESSORS) 10 MG/ML IV SOLN
INTRAVENOUS | Status: DC | PRN
  Administered 2022-04-09: 160 ug via INTRAVENOUS
  Administered 2022-04-09: 80 ug via INTRAVENOUS
  Administered 2022-04-09 (×2): 160 ug via INTRAVENOUS
  Administered 2022-04-09: 80 ug via INTRAVENOUS

## 2022-04-09 SURGICAL SUPPLY — 100 items
BAG LAPAROSCOPIC 12 15 PORT 16 (BASKET) IMPLANT
BAG RETRIEVAL 12/15 (BASKET) ×2
CANNULA REDUC XI 12-8 STAPL (CANNULA) ×2
CANNULA REDUCER 12-8 DVNC XI (CANNULA) ×1 IMPLANT
CATH ROBINSON RED A/P 16FR (CATHETERS) ×2 IMPLANT
COVER MAYO STAND REUSABLE (DRAPES) ×2 IMPLANT
COVER TIP SHEARS 8 DVNC (MISCELLANEOUS) ×1 IMPLANT
COVER TIP SHEARS 8MM DA VINCI (MISCELLANEOUS) ×2
DERMABOND ADVANCED (GAUZE/BANDAGES/DRESSINGS) ×1
DERMABOND ADVANCED .7 DNX12 (GAUZE/BANDAGES/DRESSINGS) ×1 IMPLANT
DRAPE 3/4 80X56 (DRAPES) ×2 IMPLANT
DRAPE ARM DVNC X/XI (DISPOSABLE) ×4 IMPLANT
DRAPE COLUMN DVNC XI (DISPOSABLE) ×1 IMPLANT
DRAPE DA VINCI XI ARM (DISPOSABLE) ×8
DRAPE DA VINCI XI COLUMN (DISPOSABLE) ×2
DRAPE LEGGINS SURG 28X43 STRL (DRAPES) ×2 IMPLANT
DRAPE UNDER BUTTOCK W/FLU (DRAPES) ×2 IMPLANT
ELECT BLADE 6.5 EXT (BLADE) ×2 IMPLANT
ELECT CAUTERY BLADE 6.4 (BLADE) ×2 IMPLANT
ELECT REM PT RETURN 9FT ADLT (ELECTROSURGICAL) ×2
ELECTRODE REM PT RTRN 9FT ADLT (ELECTROSURGICAL) ×1 IMPLANT
GLOVE BIO SURGEON STRL SZ7 (GLOVE) ×6 IMPLANT
GOWN STRL REUS W/ TWL LRG LVL3 (GOWN DISPOSABLE) ×5 IMPLANT
GOWN STRL REUS W/TWL LRG LVL3 (GOWN DISPOSABLE) ×10
GRASPER LAPSCPC 5X45 DSP (INSTRUMENTS) ×2 IMPLANT
HANDLE YANKAUER SUCT BULB TIP (MISCELLANEOUS) ×2 IMPLANT
IRRIGATION STRYKERFLOW (MISCELLANEOUS) ×1 IMPLANT
IRRIGATOR STRYKERFLOW (MISCELLANEOUS) ×2
IV NS 1000ML (IV SOLUTION) ×2
IV NS 1000ML BAXH (IV SOLUTION) ×1 IMPLANT
KIT IMAGING PINPOINTPAQ (MISCELLANEOUS) ×2 IMPLANT
KIT PINK PAD W/HEAD ARE REST (MISCELLANEOUS) ×2 IMPLANT
KIT PINK PAD W/HEAD ARM REST (MISCELLANEOUS) ×1 IMPLANT
LABEL OR SOLS (LABEL) ×2 IMPLANT
MANIFOLD NEPTUNE II (INSTRUMENTS) ×2 IMPLANT
NEEDLE HYPO 22GX1.5 SAFETY (NEEDLE) ×2 IMPLANT
NS IRRIG 500ML POUR BTL (IV SOLUTION) ×2 IMPLANT
OBTURATOR OPTICAL STANDARD 8MM (TROCAR) ×2
OBTURATOR OPTICAL STND 8 DVNC (TROCAR) ×1
OBTURATOR OPTICALSTD 8 DVNC (TROCAR) ×1 IMPLANT
PACK COLON CLEAN CLOSURE (MISCELLANEOUS) ×2 IMPLANT
PACK LAP CHOLECYSTECTOMY (MISCELLANEOUS) ×2 IMPLANT
PAD PREP 24X41 OB/GYN DISP (PERSONAL CARE ITEMS) ×2 IMPLANT
PENCIL ELECTRO HAND CTR (MISCELLANEOUS) ×2 IMPLANT
PORT ACCESS TROCAR AIRSEAL 5 (TROCAR) ×2 IMPLANT
RELOAD STAPLE 45 3.5 BLU DVNC (STAPLE) IMPLANT
RELOAD STAPLE 60 2.5 WHT DVNC (STAPLE) IMPLANT
RELOAD STAPLE 60 3.5 BLU DVNC (STAPLE) IMPLANT
RELOAD STAPLER 2.5X60 WHT DVNC (STAPLE) IMPLANT
RELOAD STAPLER 3.5X45 BLU DVNC (STAPLE) IMPLANT
RELOAD STAPLER 3.5X60 BLU DVNC (STAPLE) ×3 IMPLANT
SEAL CANN UNIV 5-8 DVNC XI (MISCELLANEOUS) ×3 IMPLANT
SEAL XI 5MM-8MM UNIVERSAL (MISCELLANEOUS) ×6
SEALER VESSEL DA VINCI XI (MISCELLANEOUS) ×2
SEALER VESSEL EXT DVNC XI (MISCELLANEOUS) ×1 IMPLANT
SET BI-LUMEN FLTR TB AIRSEAL (TUBING) ×2 IMPLANT
SOL PREP PVP 2OZ (MISCELLANEOUS) ×2
SOLUTION ELECTROLUBE (MISCELLANEOUS) ×2 IMPLANT
SOLUTION PREP PVP 2OZ (MISCELLANEOUS) ×1 IMPLANT
SPIKE FLUID TRANSFER (MISCELLANEOUS) ×2 IMPLANT
SPONGE T-LAP 18X18 ~~LOC~~+RFID (SPONGE) ×6 IMPLANT
SPONGE T-LAP 4X18 ~~LOC~~+RFID (SPONGE) ×2 IMPLANT
STAPLER 45 DA VINCI SURE FORM (STAPLE)
STAPLER 45 SUREFORM DVNC (STAPLE) IMPLANT
STAPLER 60 DA VINCI SURE FORM (STAPLE) ×2
STAPLER 60 SUREFORM DVNC (STAPLE) IMPLANT
STAPLER CANNULA SEAL DVNC XI (STAPLE) ×1 IMPLANT
STAPLER CANNULA SEAL XI (STAPLE) ×2
STAPLER CIRCULAR MANUAL XL 25 (STAPLE) IMPLANT
STAPLER CIRCULAR MANUAL XL 29 (STAPLE) IMPLANT
STAPLER CIRCULAR MANUAL XL 33 (STAPLE) IMPLANT
STAPLER RELOAD 2.5X60 WHITE (STAPLE)
STAPLER RELOAD 2.5X60 WHT DVNC (STAPLE)
STAPLER RELOAD 3.5X45 BLU DVNC (STAPLE)
STAPLER RELOAD 3.5X45 BLUE (STAPLE)
STAPLER RELOAD 3.5X60 BLU DVNC (STAPLE) ×3
STAPLER RELOAD 3.5X60 BLUE (STAPLE) ×6
SURGILUBE 2OZ TUBE FLIPTOP (MISCELLANEOUS) ×2 IMPLANT
SUT DVC VLOC 3-0 CL 6 P-12 (SUTURE) IMPLANT
SUT DVC VLOC 90 3-0 CV23 VLT (SUTURE) ×2
SUT MNCRL 4-0 (SUTURE) ×2
SUT MNCRL 4-0 27XMFL (SUTURE) ×1
SUT MNCRL AB 4-0 PS2 18 (SUTURE) ×2 IMPLANT
SUT PDS AB 0 CT1 27 (SUTURE) ×4 IMPLANT
SUT SILK 2 0 (SUTURE) ×4
SUT SILK 2 0 SH (SUTURE) ×3 IMPLANT
SUT SILK 2-0 30XBRD TIE 12 (SUTURE) ×1 IMPLANT
SUT VIC AB 2-0 SH 27 (SUTURE) ×2
SUT VIC AB 2-0 SH 27XBRD (SUTURE) ×1 IMPLANT
SUT VICRYL 0 AB UR-6 (SUTURE) ×3 IMPLANT
SUT VLOC 90 S/L VL9 GS22 (SUTURE) IMPLANT
SUTURE DVC VLC 90 3-0 CV23 VLT (SUTURE) IMPLANT
SUTURE MNCRL 4-0 27XMF (SUTURE) IMPLANT
SYR 20ML LL LF (SYRINGE) ×4 IMPLANT
SYR TOOMEY IRRIG 70ML (MISCELLANEOUS) ×2
SYRINGE TOOMEY IRRIG 70ML (MISCELLANEOUS) ×1 IMPLANT
SYS TROCAR 1.5-3 SLV ABD GEL (ENDOMECHANICALS) ×2
SYSTEM TROCR 1.5-3 SLV ABD GEL (ENDOMECHANICALS) ×1 IMPLANT
TRAY FOLEY SLVR 16FR LF STAT (SET/KITS/TRAYS/PACK) ×2 IMPLANT
WATER STERILE IRR 500ML POUR (IV SOLUTION) ×2 IMPLANT

## 2022-04-09 NOTE — ED Provider Notes (Signed)
[facility] Provider Note    Event Date/Time   First MD Initiated Contact with Patient 04/09/22 [id number]     (approximate)   History   Back Pain   HPI  Melissa Coffey is a 26 y.o. female with history of UTIs, pyelonephritis who comes in with concern for flank pain.  Patient reports that pain that started yesterday.  She reports the majority of her pain is in her left lower quadrant but she also has a lot in her right flank.  She reports the pain has been constant.  She denies any urinary symptoms, vaginal discharge.  Denies any chest pain, shortness of breath.  She denies any recent long travel, recent surgery, estrogen use, history of blood clots.  She does report a history of sepsis.  She denies any alcohol use or IV drug use.  Does report using cigarettes.  She reports having a tubal ligation and endometriosis surgery.  She reports some associated nausea but no diarrhea or vomiting.   On review of records patient was admitted-patient was discharged on 05/2018 with sepsis.  Urine culture was positive for E. coli and CT does show left-sided pyelonephritis.  Patient was discharged on Ancef 2 g for 14 days with PICC line due to concern for bacteremia      Physical Exam   Triage Vital Signs: ED Triage Vitals  Enc Vitals Group     BP 04/09/22 0716 122/76     Pulse Rate 04/09/22 0716 (!) 146     Resp 04/09/22 0716 18     Temp 04/09/22 0716 98.3 F (36.8 C)     Temp Source 04/09/22 0716 Oral     SpO2 04/09/22 0716 97 %     Weight 04/09/22 0714 140 lb (63.5 kg)     Height 04/09/22 0714 5\' 3"  (1.6 m)     Head Circumference --      Peak Flow --      Pain Score 04/09/22 0714 8     Pain Loc --      Pain Edu? --      Excl. in [place]? --     Most recent vital signs: Vitals:   04/09/22 0716  BP: 122/76  Pulse: (!) 146  Resp: 18  Temp: 98.3 F (36.8 C)  SpO2: 97%     General: Awake, no distress.  CV:  Good peripheral perfusion.  Resp:  Normal effort.   Abd:  No distention.  Tender in the lower abdomen throughout little bit worse on the left.  She is got no midline back tenderness but a little bit of right CVA tenderness. Other:     ED Results / Procedures / Treatments   Labs (all labs ordered are listed, but only abnormal results are displayed) Labs Reviewed  CULTURE, BLOOD (ROUTINE X 2)  CULTURE, BLOOD (ROUTINE X 2)  URINE CULTURE  RESP PANEL BY RT-PCR (FLU A&B, COVID) ARPGX2  LACTIC ACID, PLASMA  LACTIC ACID, PLASMA  COMPREHENSIVE METABOLIC PANEL  CBC WITH DIFFERENTIAL/PLATELET  PROTIME-INR  APTT  URINALYSIS, COMPLETE (UACMP) WITH MICROSCOPIC  PROCALCITONIN  HCG, QUANTITATIVE, PREGNANCY  POC URINE PREG, ED     EKG  My interpretation of EKG:  Sinus tachycardia rate of 108, no ST elevation, no T wave inversions, normal intervals  RADIOLOGY I have reviewed the xray personally and and interpreted and do not see evidence of pneumonia   PROCEDURES:  Critical Care performed: Yes, see critical care procedure note(s)  .1-3 Lead  EKG Interpretation  Performed by: Vanessa [place], MD Authorized by: Vanessa [place], MD     Interpretation: abnormal     ECG rate:  110   ECG rate assessment: tachycardic     Rhythm: sinus tachycardia     Ectopy: none     Conduction: normal   .Critical Care  Performed by: Vanessa [place], MD Authorized by: Vanessa , MD   Critical care provider statement:    Critical care time (minutes):  30   Critical care was necessary to treat or prevent imminent or life-threatening deterioration of the following conditions:  Sepsis   Critical care was time spent personally by me on the following activities:  Development of treatment plan with patient or surrogate, discussions with consultants, evaluation of patient's response to treatment, examination of patient, ordering and review of laboratory studies, ordering and review of radiographic studies, ordering and performing treatments and interventions,  pulse oximetry, re-evaluation of patient's condition and review of old charts    MEDICATIONS ORDERED IN ED: Medications  sodium chloride 0.9 % bolus 1,000 mL (has no administration in time range)  HYDROmorphone (DILAUDID) injection 0.5 mg (0.5 mg Intravenous Given 04/09/22 0742)  ondansetron (ZOFRAN) injection 4 mg (4 mg Intravenous Given 04/09/22 0741)  sodium chloride 0.9 % bolus 1,000 mL (1,000 mLs Intravenous New Bag/Given 04/09/22 0740)     IMPRESSION / MDM / [place] / ED COURSE  I reviewed the triage vital signs and the nursing notes.   Patient's presentation is most consistent with acute complicated illness / injury requiring diagnostic workup.   Differential includes sepsis but unclear source at this time.  Given only tachycardia we will hold off on antibiotics and wait for blood work.  CT imaging will be ordered to evaluate for kidney stone, appendicitis, diverticulitis, pyelonephritis.  Denies any shortness of breath to suggest pulmonary embolism and seems less likely.  Patient will be given some IV fluids IV Dilaudid IV Zofran to help with symptoms while awaiting work-up   IMPRESSION: 1. Segmental decreased perfusion in the upper pole right kidney with subtle perinephric edema/inflammation. Imaging features are highly suggestive of pyelonephritis. 2. Relatively long segment of jejunal intussusception in the left abdomen measuring on the order of 15-20 cm in length. There is some mild associated wall thickening but no perienteric edema and no proximal dilatation to suggest obstruction. No small bowel mass lesion or mesenteric lymphadenopathy to indicate a lead point. The length of the intussusception is longer than typically seen in the setting of transient peristalsis related intussusception. While this may be benign and transient, given the degree of bowel involvement, close follow-up recommended. 3. Otherwise unremarkable exam.  CBC elevated white count.   CMP stable creatinine.  Lactate was normal  11:08 AM CT concerning for pyelonephritis given her history of bacteremia recommended admission for IV antibiotics.  There is also this question of some jejunal intussusception but no evidence of obstruction.  She denies any vomiting and she is still passing gas   Dr. Dahlia Byes plans to take her to the OR to evaluate.  Patient was admitted to the hospitalist however due to her sepsis and possible pyelonephritis    The patient is on the cardiac monitor to evaluate for evidence of arrhythmia and/or significant heart rate changes.      FINAL CLINICAL IMPRESSION(S) / ED DIAGNOSES   Final diagnoses:  Sepsis, due to unspecified organism, unspecified whether acute organ dysfunction present ([place])  Pyelonephritis  Intussusception ([place])  Rx / DC Orders   ED Discharge Orders     None        Note:  This document was prepared using Dragon voice recognition software and may include unintentional dictation errors.   Vanessa [place], MD 04/09/22 1530

## 2022-04-09 NOTE — Progress Notes (Signed)
CODE SEPSIS - PHARMACY COMMUNICATION  **Broad Spectrum Antibiotics should be administered within 1 hour of Sepsis diagnosis**  Time Code Sepsis Called/Page Received: 1114  Antibiotics Ordered: ceftriaxone  Time of 1st antibiotic administration: 1122  Additional action taken by pharmacy: none required  If necessary, Name of Provider/Nurse Contacted: NA    Lowella Bandy ,PharmD Clinical Pharmacist  04/09/2022  11:09 AM

## 2022-04-09 NOTE — ED Notes (Signed)
Pt signed consent form- placed with chart.

## 2022-04-09 NOTE — Progress Notes (Deleted)
POD #1 Doing well Tolerated CLD AVSS Good UO Labs ok   PE NAD Abd: soft, dressing intact, appropriate tenderness w/o peritonitis   A/P Doing well Dc foley Ambulate Awaiting return BF 

## 2022-04-09 NOTE — Sepsis Progress Note (Signed)
Sepsis protocol is being followed by [facility]. 

## 2022-04-09 NOTE — Consult Note (Signed)
Patient ID: Melissa Coffey, female   DOB: May 19, 1996, 26 y.o.   MRN: [id number]  HPI Melissa Coffey is a 26 y.o. female in in consultation at the request of Dr. Jari Pigg.  Reports severe pain in the left side of her abdomen.  The pain started yesterday and is colicky severe.  No other specific alleviating factors.  She had some narcotics in the ER that improved the pain but now she has significant pain back again.  She also reports some decreased appetite and nausea.  She did have a similar episode about 3 to 4 years ago showing intussusception.  This time a CT scan was performed that I have personally reviewed showing evidence of long segment intussusception in the left upper quadrant.  There is no perforation and there is some wall thickening.  No evidence of free air. Her white count is 16.5, normal hemoglobin and platelets.  CMP is completely normal.  AK-Tate is normal. Has medical history significant of recurrent UTIs. History of C-sections  HPI  Past Medical History:  Diagnosis Date   Anemia    Depression    Endometriosis    History of blood transfusion 2017   after placental abruption   Pyelonephritis 05/2018   Sepsis ([place]) 05/2018   associated with pyelo   UTI (urinary tract infection)     Past Surgical History:  Procedure Laterality Date   ABDOMINAL SURGERY     edemetriosis  Exploratory lap   CESAREAN SECTION  02/2014   pLTCS. FITL at term   CESAREAN SECTION N/A 05/09/2016   Procedure: CESAREAN SECTION;  Surgeon: Will Bonnet, MD;  Location: [facility] ORS;  Service: Obstetrics;  Laterality: N/A;   CESAREAN SECTION WITH BILATERAL TUBAL LIGATION N/A 08/19/2019   Procedure: CESAREAN SECTION WITH BILATERAL TUBAL LIGATION;  Surgeon: Will Bonnet, MD;  Location: [facility] ORS;  Service: Obstetrics;  Laterality: N/A;   NECK SURGERY  2003   TONSILLECTOMY      Family History  Problem Relation Age of Onset   Diabetes Maternal Grandmother    Hypertension Maternal Grandmother     Cancer Maternal Grandmother    Breast cancer Maternal Grandmother    Diabetes Maternal Uncle    Healthy Mother    Healthy Brother    Healthy Daughter    Healthy Son    Healthy Brother    Stroke Neg Hx    Heart attack Neg Hx    Colon cancer Neg Hx    Ovarian cancer Neg Hx     Social History Social History   Tobacco Use   Smoking status: Former    Packs/day: 1.00    Years: 0.50    Total pack years: 0.50    Types: Cigarettes    Quit date: 12/03/2018    Years since quitting: 3.3   Smokeless tobacco: Never   Tobacco comments:    quit w/+pregnancy test  Vaping Use   Vaping Use: Never used  Substance Use Topics   Alcohol use: No   Drug use: Not Currently    No Known Allergies  Current Facility-Administered Medications  Medication Dose Route Frequency Provider Last Rate Last Admin   acetaminophen (TYLENOL) tablet 650 mg  650 mg Oral Q6H PRN Agbata, Tochukwu, MD       Or   acetaminophen (TYLENOL) suppository 650 mg  650 mg Rectal Q6H PRN Agbata, Tochukwu, MD       [START ON 04/10/2022] cefTRIAXone (ROCEPHIN) 1 g in sodium chloride 0.9 % 100 mL IVPB  1 g Intravenous Q24H Agbata, Tochukwu, MD       [START ON 04/10/2022] Chlorhexidine Gluconate Cloth 2 % PADS 6 each  6 each Topical Q0600 Sheritta Deeg F, MD       enoxaparin (LOVENOX) injection 40 mg  40 mg Subcutaneous Q24H Agbata, Tochukwu, MD       metroNIDAZOLE (FLAGYL) IVPB 500 mg  500 mg Intravenous Q12H Harlis Champoux F, MD       ondansetron (ZOFRAN) tablet 4 mg  4 mg Oral Q6H PRN Agbata, Tochukwu, MD       Or   ondansetron (ZOFRAN) injection 4 mg  4 mg Intravenous Q6H PRN Agbata, Tochukwu, MD       Current Outpatient Medications  Medication Sig Dispense Refill   cephALEXin (KEFLEX) 500 MG capsule Take 1 capsule (500 mg total) by mouth 3 (three) times daily. 21 capsule 0   famotidine (PEPCID) 20 MG tablet Take 1 tablet (20 mg total) by mouth 2 (two) times daily. 60 tablet 0   naproxen (NAPROSYN) 500 MG tablet Take 1 tablet  (500 mg total) by mouth 2 (two) times daily with a meal. 20 tablet 0   ondansetron (ZOFRAN ODT) 4 MG disintegrating tablet Take 1 tablet (4 mg total) by mouth every 8 (eight) hours as needed for nausea or vomiting. 20 tablet 0     Review of Systems Full ROS  was asked and was negative except for the information on the HPI  Physical Exam Blood pressure 99/71, pulse (!) 111, temperature 98.3 F (36.8 C), temperature source Oral, resp. rate 18, height 5\' 3"  (1.6 m), weight 63.5 kg, last menstrual period 03/26/2022, SpO2 100 %, unknown if currently breastfeeding. CONSTITUTIONAL: she is laying still in obvious discomfort from pain EYES: Pupils are equal, round,  Sclera are non-icteric. EARS, NOSE, MOUTH AND THROAT: The oral mucosa is pink and moist. Hearing is intact to voice. LYMPH NODES:  Lymph nodes in the neck are normal. RESPIRATORY:  Lungs are clear. There is normal respiratory effort, with equal breath sounds bilaterally, and without pathologic use of accessory muscles. CARDIOVASCULAR: Heart is regular without murmurs, gallops, or rubs. GI: He does have tenderness palpation on the left upper quadrant with rebound tenderness.  I do not see any masses.  C-section scar. No hernias, decrease BS. GU: Rectal deferred.   MUSCULOSKELETAL: Normal muscle strength and tone. No cyanosis or edema.   SKIN: Turgor is good and there are no pathologic skin lesions or ulcers. NEUROLOGIC: Motor and sensation is grossly normal. Cranial nerves are grossly intact. PSYCH:  Oriented to person, place and time. Affect is normal.  Data Reviewed  I have personally reviewed the patient's imaging, laboratory findings and medical records.    Assessment/Plan 26 year old female with long segment of small bowel intussusception.  This is certainly abnormal and different a functional intussusception.  She did have a similar episode in 2019 and her current exam is concerning.  Given that there is recurrent of the  intussusception and her clinical exam is concerning I do suggest at least a diagnostic laparoscopy.  I do think that with a robotic platform we can confidently run the small bowel for any pathology.  She understands that there is a good chance that we may have to do a bowel resection.  Procedure discussed with the patient in detail.  Risks, benefits and possible complications.  We will start antibiotic therapy as well as aggressive fluid resuscitation. I spent greater than 75 minutes in this encounter including coordination  of her care, personally reviewing imaging studies, performing appropriate documentation and counseling the patient  Caroleen Hamman, MD FACS General Surgeon 04/09/2022, 12:40 PM

## 2022-04-09 NOTE — Assessment & Plan Note (Addendum)
Patient has diffuse abdominal pain associated with nausea in addition to bilateral CVA tenderness. Noted on CT scan findings which showed relatively long segment of jejunal intussusception in the left abdomen measuring on the order of 15-20 cm in length. There is some mild associated wall thickening but no perienteric edema and no proximal dilatation to suggest obstruction. No small bowel mass lesion or mesenteric lymphadenopathy to indicate a lead point.  We will request surgical consult

## 2022-04-09 NOTE — Assessment & Plan Note (Signed)
Smoking cessation discussed with patient She declines a nicotine transdermal patch at this time

## 2022-04-09 NOTE — Op Note (Signed)
Robotic assisted laparoscopic Small bowel resection w intracorporeal stapler anastomosis and ICG to check perfusion of Graft/Anastomosis  Pre-operative Diagnosis: Small bowel intussusception  Post-operative Diagnosis: small bowel volvulus  Procedure:  Robotic assisted laparoscopic Small bowel resection w intracorporeal stapler anastomosis and ICG to check perfusion of Graft/Anastomosis  Surgeon: Sterling Big, MD FACS  Anesthesia: Gen. with endotracheal tube  Findings: Tension Free anastomosis w/o intra op leak Good perfusion of anastomosis confirmed by ICG Small bowel volvulus, viable bowel  Estimated Blood Loss: 5cc       Specimens: Small bowel        Complications: none   Procedure Details  The patient was seen again in the Holding Room. The benefits, complications, treatment options, and expected outcomes were discussed with the patient. The risks of bleeding, infection, recurrence of symptoms, failure to resolve symptoms, anastomotic leak,bowel injury, any of which could require further surgery  were reviewed with the patient. The likelihood of improving the patient's symptoms with return to their baseline status is good.  The patient and/or family concurred with the proposed plan, giving informed consent.  The patient was taken to Operating Room, identified  and the procedure verified,  A Time Out was held and the above information confirmed.  Prior to the induction of general anesthesia, antibiotic prophylaxis was administered. VTE prophylaxis was in place. General endotracheal anesthesia was then administered and tolerated well. After the induction, the abdomen was prepped with Chloraprep and draped in the sterile fashion. The patient was positioned in the supine position. Veres needle insertred RUQ, saline drop test and pressures were appropiate. Pneumoperitoneum was then created with CO2 and tolerated well without any adverse changes in the patient's vital signs.  AFour 8-mm  ports were placed under direct vision. No evidence of bowel injuries..  The patient was positioned  in Trendelenburg, robot was brought to the surgical field and docked in the standard fashion.  We made sure all the instrumentation was kept indirect view at all times and that there were no collision between the arms. I scrubbed out and went to the console. Inspection revealed some adhesions that were lyses, there was a clear evidence of bowel obstruction caused by a volvulised segment. It was not a tru intussusception but rather the small bowel tnagle within it self. I was not able to reduce the bowel and needed to do a resection. Mesenterir defects created proximally and distally. The bowel was also divided with healthy margins proximally and distally using a.  64mm robotic stapler .  We made sure that we use ICG before dividing any bowel structures. The rest of the specimen was removed after dividing the mesentery with vessel sealer. I was able to create an side-to-side functional end-to-end stapled anastomosis.    The common channel was closed using first layer of simple 3 o V-Loc suture followed by vertical conel suture. THere  was excellent perfusion of the anasdtomosis using ICG and there was a tension-free anastomosis.No evidence of any twisting.  Anastomosis was widely patent with good perfusion and tension free.  I removed all the needles under direct visualization.  At this point I removed the bowel via the right lower quadrant   port after placing  The bowel in a specimen bag.  We undocked the robot and I scrubbed back in.  All the robotic ports were removed under direct visualization increase our incision in the right lower quadrant to allow the extraction of the specimen.  It was sent for permanent pathology.  At  this point time I was able to use liposomal Marcaine to perform an abdominal block and inciision sites.  The anterior and posterior fascia were closed with  a running 0 vicryl sutures in  the standard fashion.  All the skin incisions were closed with4-0 monocryl and dermabond was applied  The patient was then extubated and brought to the recovery room in stable condition. Sponge, lap, and needle counts were correct at closure and at the conclusion of the case.               Sterling Big, MD, FACS

## 2022-04-09 NOTE — Anesthesia Preprocedure Evaluation (Addendum)
Anesthesia Evaluation  Patient identified by MRN, date of birth, ID band Patient awake    Reviewed: Allergy & Precautions, NPO status , Patient's Chart, lab work & pertinent test results  Airway Mallampati: II  TM Distance: >3 FB Neck ROM: full    Dental no notable dental hx. (+) Chipped   Pulmonary neg pulmonary ROS, former smoker,    Pulmonary exam normal        Cardiovascular negative cardio ROS Normal cardiovascular exam     Neuro/Psych PSYCHIATRIC DISORDERS Anxiety Depression negative neurological ROS     GI/Hepatic negative GI ROS, Neg liver ROS,   Endo/Other  negative endocrine ROS  Renal/GU      Musculoskeletal   Abdominal (+)  Abdomen: soft and tender.    Peds  Hematology negative hematology ROS (+)   Anesthesia Other Findings Pt has low back pain/bilateral lumbar pain which started 1 day prior to her admission. Tachypnea, tachycardia, leukocytosis, small bowel obstruction, Intussusception intestine, Pyelonephritis  Past Medical History: No date: Anemia No date: Depression No date: Endometriosis 2017: History of blood transfusion     Comment:  after placental abruption 05/2018: Pyelonephritis 05/2018: Sepsis ([facility])     Comment:  associated with pyelo No date: UTI (urinary tract infection)  Past Surgical History: No date: ABDOMINAL SURGERY     Comment:  edemetriosis  Exploratory lap 02/2014: CESAREAN SECTION     Comment:  pLTCS. FITL at term 05/09/2016: CESAREAN SECTION; N/A     Comment:  Procedure: CESAREAN SECTION;  Surgeon: Conard Novak, MD;  Location: [facility] ORS;  Service: Obstetrics;                Laterality: N/A; 08/19/2019: CESAREAN SECTION WITH BILATERAL TUBAL LIGATION; N/A     Comment:  Procedure: CESAREAN SECTION WITH BILATERAL TUBAL               LIGATION;  Surgeon: Conard Novak, MD;  Location:               [facility] ORS;  Service: Obstetrics;  Laterality:  N/A; 2003: NECK SURGERY No date: TONSILLECTOMY  BMI    Body Mass Index: 24.80 kg/m      Reproductive/Obstetrics negative OB ROS                           Anesthesia Physical Anesthesia Plan  ASA: 3 and emergent  Anesthesia Plan: General ETT   Post-op Pain Management:    Induction: Intravenous and Rapid sequence  PONV Risk Score and Plan: 4 or greater and Ondansetron, Dexamethasone, Midazolam and Promethazine  Airway Management Planned: Oral ETT  Additional Equipment:   Intra-op Plan:   Post-operative Plan: Extubation in OR  Informed Consent: I have reviewed the patients History and Physical, chart, labs and discussed the procedure including the risks, benefits and alternatives for the proposed anesthesia with the patient or authorized representative who has indicated his/her understanding and acceptance.     Dental Advisory Given  Plan Discussed with: Anesthesiologist, CRNA and Surgeon  Anesthesia Plan Comments:        Anesthesia Quick Evaluation

## 2022-04-09 NOTE — ED Triage Notes (Addendum)
Pt comes pov with lower back pain. Hurts around front to the groin on both sides. Pt has hx of sepsis and pyelo. Denies urinary burning or urgency at this time but states she didn't have it with her last episode either. HR 146.

## 2022-04-09 NOTE — Assessment & Plan Note (Signed)
Treatment as outlined in 1 

## 2022-04-09 NOTE — Assessment & Plan Note (Addendum)
As evidenced by marked tachycardia, tachypnea, leukocytosis and pyuria. Chart review shows a prior urine culture yielded pansensitive E. Coli Aggressive IV fluid resuscitation Continue empiric antibiotic therapy with Rocephin Follow-up results of blood and urine culture Supportive care with antiemetics and pain medication.

## 2022-04-09 NOTE — Anesthesia Postprocedure Evaluation (Signed)
Anesthesia Post Note  Patient: Melissa Coffey  Procedure(s) Performed: XI ROBOTIC ASSISTED SMALL BOWEL RESECTION (Abdomen)  Patient location during evaluation: PACU Anesthesia Type: General Level of consciousness: awake and alert Pain management: pain level controlled Vital Signs Assessment: post-procedure vital signs reviewed and stable Respiratory status: spontaneous breathing, nonlabored ventilation and respiratory function stable Cardiovascular status: blood pressure returned to baseline and stable Postop Assessment: no apparent nausea or vomiting Anesthetic complications: no   No notable events documented.   Last Vitals:  Vitals:   04/09/22 1737 04/09/22 1925  BP: (!) 105/59 99/69  Pulse: 86 91  Resp: 18 17  Temp: 37.1 C 36.4 C  SpO2: 99% 99%    Last Pain:  Vitals:   04/09/22 2029  TempSrc:   PainSc: 7                  Negin Hegg Romie Minus

## 2022-04-09 NOTE — Anesthesia Procedure Notes (Signed)
Procedure Name: Intubation Date/Time: 04/09/2022 2:05 PM  Performed by: Lynden Oxford, CRNAPre-anesthesia Checklist: Patient identified, Emergency Drugs available, Suction available and Patient being monitored Patient Re-evaluated:Patient Re-evaluated prior to induction Oxygen Delivery Method: Circle system utilized Preoxygenation: Pre-oxygenation with 100% oxygen Induction Type: IV induction Ventilation: Mask ventilation without difficulty Laryngoscope Size: McGraph and 3 Grade View: Grade I Tube type: Oral Tube size: 7.0 mm Number of attempts: 1 Airway Equipment and Method: Stylet and Video-laryngoscopy Placement Confirmation: ETT inserted through vocal cords under direct vision, positive ETCO2 and breath sounds checked- equal and bilateral Secured at: 19 cm Tube secured with: Tape Dental Injury: Teeth and Oropharynx as per pre-operative assessment

## 2022-04-09 NOTE — Transfer of Care (Signed)
Immediate Anesthesia Transfer of Care Note  Patient: Melissa Coffey  Procedure(s) Performed: XI ROBOTIC ASSISTED SMALL BOWEL RESECTION (Abdomen)  Patient Location: PACU  Anesthesia Type:General  Level of Consciousness: drowsy and patient cooperative  Airway & Oxygen Therapy: Patient Spontanous Breathing and Patient connected to face mask oxygen  Post-op Assessment: Report given to RN  Post vital signs: Reviewed and stable  Last Vitals:  Vitals Value Taken Time  BP 108/62 04/09/22 1638  Temp    Pulse 96 04/09/22 1643  Resp 23 04/09/22 1643  SpO2 100 % 04/09/22 1643  Vitals shown include unvalidated device data.  Last Pain:  Vitals:   04/09/22 1255  TempSrc: Oral  PainSc:          Complications: No notable events documented.

## 2022-04-09 NOTE — H&P (Addendum)
History and Physical    Patient: Melissa Coffey GGY:[id number] DOB: [birth year] DOA: 04/09/2022 DOS: the patient was seen and examined on 04/09/2022 PCP: Patient, No Pcp Per (Inactive)  Patient coming from: Home  Chief Complaint:  Chief Complaint  Patient presents with   Back Pain   HPI: Melissa Coffey is a 26 y.o. female with medical history significant for depression, anemia, endometriosis who presents to the ER for evaluation of low back pain/bilateral lumbar pain which started 1 day prior to her admission and worsened overnight. She also complains of pain in the lower quadrants associated with nausea.  She rates her pain a 7 to 8 x 10 in intensity at its worst which prompted her visit to the ER.  She has frequency of urination but denies having any dysuria or hematuria.  She has had chills at home but denies having any fever, no cough, no changes in her bowel habits, no lower extremity swelling, no headache, no shortness of breath, no chest pain, no dizziness, no lightheadedness, no blurred vision no focal deficit. Upon arrival to the ER she was tachycardic with heart rate of 146, tachypneic with respiratory rate of 24. Labs showed leukocytosis and pyuria. CT scan of abdomen and pelvis shows segmental decreased perfusion in the upper pole right kidney with subtle perinephric edema/inflammation. Imaging features are highly suggestive of pyelonephritis. Patient will be admitted to the hospital for further evaluation.     Review of Systems: As mentioned in the history of present illness. All other systems reviewed and are negative. Past Medical History:  Diagnosis Date   Anemia    Depression    Endometriosis    History of blood transfusion 2017   after placental abruption   Pyelonephritis 05/2018   Sepsis ([place]) 05/2018   associated with pyelo   UTI (urinary tract infection)    Past Surgical History:  Procedure Laterality Date   ABDOMINAL SURGERY     edemetriosis  Exploratory  lap   CESAREAN SECTION  02/2014   pLTCS. FITL at term   CESAREAN SECTION N/A 05/09/2016   Procedure: CESAREAN SECTION;  Surgeon: Will Bonnet, MD;  Location: [facility] ORS;  Service: Obstetrics;  Laterality: N/A;   CESAREAN SECTION WITH BILATERAL TUBAL LIGATION N/A 08/19/2019   Procedure: CESAREAN SECTION WITH BILATERAL TUBAL LIGATION;  Surgeon: Will Bonnet, MD;  Location: [facility] ORS;  Service: Obstetrics;  Laterality: N/A;   NECK SURGERY  2003   TONSILLECTOMY     Social History:  reports that she quit smoking about 3 years ago. Her smoking use included cigarettes. She has a 0.50 pack-year smoking history. She has never used smokeless tobacco. She reports that she does not currently use drugs. She reports that she does not drink alcohol.  No Known Allergies  Family History  Problem Relation Age of Onset   Diabetes Maternal Grandmother    Hypertension Maternal Grandmother    Cancer Maternal Grandmother    Breast cancer Maternal Grandmother    Diabetes Maternal Uncle    Healthy Mother    Healthy Brother    Healthy Daughter    Healthy Son    Healthy Brother    Stroke Neg Hx    Heart attack Neg Hx    Colon cancer Neg Hx    Ovarian cancer Neg Hx     Prior to Admission medications   Medication Sig Start Date End Date Taking? Authorizing Provider  cephALEXin (KEFLEX) 500 MG capsule Take 1 capsule (500 mg total) by  mouth 3 (three) times daily. 09/06/21   Carrie Mew, MD  famotidine (PEPCID) 20 MG tablet Take 1 tablet (20 mg total) by mouth 2 (two) times daily. 09/06/21   Carrie Mew, MD  naproxen (NAPROSYN) 500 MG tablet Take 1 tablet (500 mg total) by mouth 2 (two) times daily with a meal. 09/06/21   Carrie Mew, MD  ondansetron (ZOFRAN ODT) 4 MG disintegrating tablet Take 1 tablet (4 mg total) by mouth every 8 (eight) hours as needed for nausea or vomiting. 09/06/21   Carrie Mew, MD    Physical Exam: Vitals:   04/09/22 1230 04/09/22 1241 04/09/22 1255  04/09/22 1638  BP: 99/71  99/65   Pulse: (!) 111  100   Resp: 18  18   Temp:  99.4 F (37.4 C) 98.8 F (37.1 C) 98.6 F (37 C)  TempSrc:  Oral Oral   SpO2: 100%  100%   Weight:      Height:       Physical Exam Vitals and nursing note reviewed.  Constitutional:      Appearance: Normal appearance.     Comments: Acutely ill-appearing  HENT:     Head: Normocephalic and atraumatic.     Nose: Nose normal.     Mouth/Throat:     Mouth: Mucous membranes are moist.  Eyes:     Pupils: Pupils are equal, round, and reactive to light.  Cardiovascular:     Rate and Rhythm: Tachycardia present.  Pulmonary:     Effort: Pulmonary effort is normal.     Breath sounds: Normal breath sounds.  Abdominal:     General: Abdomen is flat. Bowel sounds are normal.     Palpations: Abdomen is soft.     Tenderness: There is abdominal tenderness.     Comments: Bilateral CVA tenderness (Rt > Lt).  Diffusely tender  Musculoskeletal:     Cervical back: Normal range of motion and neck supple.  Skin:    General: Skin is warm and dry.  Neurological:     General: No focal deficit present.     Mental Status: She is alert.  Psychiatric:        Mood and Affect: Mood normal.        Behavior: Behavior normal.     Data Reviewed: Relevant notes from primary care and specialist visits, past discharge summaries as available in EHR, including Care Everywhere. Prior diagnostic testing as pertinent to current admission diagnoses Updated medications and problem lists for reconciliation ED course, including vitals, labs, imaging, treatment and response to treatment Triage notes, nursing and pharmacy notes and ED provider's notes Notable results as noted in HPI Labs reviewed.  Sodium 134, potassium 3.8, chloride 103, bicarb 24, glucose 106, BUN 11, creatinine 0.53, calcium 9.2, total protein 7.5, albumin 4.1, AST 18, ALT 12, alk phos 69, PT 13.2, INR 1.0, lactic acid 1.6, white count 16.5, hematocrit 38.3,  hemoglobin 12.1, platelet count 319 Respiratory viral panel is negative Chest x-ray reviewed by me shows no acute findings CT scan of abdomen and pelvis shows segmental decreased perfusion in the upper pole right kidney with subtle perinephric edema/inflammation. Imaging features are highly suggestive of pyelonephritis. Relatively long segment of jejunal intussusception in the left abdomen measuring on the order of 15-20 cm in length. There is some mild associated wall thickening but no perienteric edema and no proximal dilatation to suggest obstruction. No small bowel mass lesion or mesenteric lymphadenopathy to indicate a lead point. The length of the intussusception is  longer than typically seen in the setting of transient peristalsis related intussusception. While this may be benign and transient, given the degree of bowel involvement, close follow-up recommended. Otherwise unremarkable exam. Twelve-lead EKG reviewed by me shows sinus tachycardia There are no new results to review at this time.  Assessment and Plan: * Sepsis ([place]) As evidenced by marked tachycardia, tachypnea, leukocytosis and pyuria. Chart review shows a prior urine culture yielded pansensitive E. Coli Aggressive IV fluid resuscitation Continue empiric antibiotic therapy with Rocephin Follow-up results of blood and urine culture Supportive care with antiemetics and pain medication.  Pyelonephritis Treatment as outlined in 1  Nicotine dependence Smoking cessation discussed with patient She declines a nicotine transdermal patch at this time  Intussusception intestine [facility]) Patient has diffuse abdominal pain associated with nausea in addition to bilateral CVA tenderness. Noted on CT scan findings which showed relatively long segment of jejunal intussusception in the left abdomen measuring on the order of 15-20 cm in length. There is some mild associated wall thickening but no perienteric edema and no proximal  dilatation to suggest obstruction. No small bowel mass lesion or mesenteric lymphadenopathy to indicate a lead point.  We will request surgical consult      Advance Care Planning:   Code Status: Full Code   Consults: None  Family Communication: Greater than 50% of time was spent discussing patient's condition and plan of care with her at the bedside.  All questions and concerns have been addressed.  She verbalizes understanding and agrees with the plan.  Severity of Illness: The appropriate patient status for this patient is INPATIENT. Inpatient status is judged to be reasonable and necessary in order to provide the required intensity of service to ensure the patient's safety. The patient's presenting symptoms, physical exam findings, and initial radiographic and laboratory data in the context of their chronic comorbidities is felt to place them at high risk for further clinical deterioration. Furthermore, it is not anticipated that the patient will be medically stable for discharge from the hospital within 2 midnights of admission.   * I certify that at the point of admission it is my clinical judgment that the patient will require inpatient hospital care spanning beyond 2 midnights from the point of admission due to high intensity of service, high risk for further deterioration and high frequency of surveillance required.*  Author: Collier Bullock, MD 04/09/2022 4:59 PM  For on call review [email or web address].

## 2022-04-10 ENCOUNTER — Encounter: Payer: Self-pay | Admitting: Surgery

## 2022-04-10 DIAGNOSIS — A419 Sepsis, unspecified organism: Secondary | ICD-10-CM | POA: Diagnosis not present

## 2022-04-10 DIAGNOSIS — N12 Tubulo-interstitial nephritis, not specified as acute or chronic: Secondary | ICD-10-CM | POA: Diagnosis not present

## 2022-04-10 DIAGNOSIS — K561 Intussusception: Secondary | ICD-10-CM | POA: Diagnosis not present

## 2022-04-10 LAB — CBC
HCT: 30.7 % — ABNORMAL LOW (ref 36.0–46.0)
Hemoglobin: 9.9 g/dL — ABNORMAL LOW (ref 12.0–15.0)
MCH: 25.4 pg — ABNORMAL LOW (ref 26.0–34.0)
MCHC: 32.2 g/dL (ref 30.0–36.0)
MCV: 78.9 fL — ABNORMAL LOW (ref 80.0–100.0)
Platelets: 259 10*3/uL (ref 150–400)
RBC: 3.89 MIL/uL (ref 3.87–5.11)
RDW: 16.3 % — ABNORMAL HIGH (ref 11.5–15.5)
WBC: 17.6 10*3/uL — ABNORMAL HIGH (ref 4.0–10.5)
nRBC: 0 % (ref 0.0–0.2)

## 2022-04-10 LAB — BASIC METABOLIC PANEL
Anion gap: 4 — ABNORMAL LOW (ref 5–15)
BUN: 12 mg/dL (ref 6–20)
CO2: 25 mmol/L (ref 22–32)
Calcium: 8.5 mg/dL — ABNORMAL LOW (ref 8.9–10.3)
Chloride: 111 mmol/L (ref 98–111)
Creatinine, Ser: 0.36 mg/dL — ABNORMAL LOW (ref 0.44–1.00)
GFR, Estimated: >60 mL/min (ref >60)
Glucose, Bld: 116 mg/dL — ABNORMAL HIGH (ref 70–99)
Potassium: 4.2 mmol/L (ref 3.5–5.1)
Sodium: 140 mmol/L (ref 135–145)

## 2022-04-10 LAB — PROTIME-INR
INR: 1.1 (ref 0.8–1.2)
Prothrombin Time: 13.9 seconds (ref 11.4–15.2)

## 2022-04-10 LAB — PROCALCITONIN: Procalcitonin: <0.1 ng/mL

## 2022-04-10 LAB — HIV ANTIBODY (ROUTINE TESTING W REFLEX): HIV Screen 4th Generation wRfx: NONREACTIVE

## 2022-04-10 LAB — CORTISOL-AM, BLOOD: Cortisol - AM: 1.8 ug/dL — ABNORMAL LOW (ref 6.7–22.6)

## 2022-04-10 MED ORDER — LIDOCAINE 5 % EX PTCH
2.0000 | MEDICATED_PATCH | CUTANEOUS | Status: DC
  Administered 2022-04-10: 1 via TRANSDERMAL
  Administered 2022-04-11: 2 via TRANSDERMAL
  Filled 2022-04-10 (×2): qty 2

## 2022-04-10 MED ORDER — SODIUM CHLORIDE 0.9 % IV BOLUS
500.0000 mL | Freq: Once | INTRAVENOUS | Status: AC
Start: 2022-04-10 — End: 2022-04-10
  Administered 2022-04-10: 500 mL via INTRAVENOUS

## 2022-04-10 MED ORDER — HYDROMORPHONE HCL 1 MG/ML IJ SOLN
1.0000 mg | INTRAMUSCULAR | Status: DC | PRN
  Administered 2022-04-10 – 2022-04-12 (×6): 1 mg via INTRAVENOUS
  Filled 2022-04-10 (×6): qty 1

## 2022-04-10 NOTE — Progress Notes (Addendum)
Progress Note    Melissa Coffey  [id number] DOB: August 28, 1996  DOA: 04/09/2022 PCP: Patient, No Pcp Per (Inactive)      Brief Narrative:    Medical records reviewed and are as summarized below:  Melissa CASTLEBERRY is a 26 y.o. female with medical history significant for depression, anemia, endometriosis, presented to the hospital with low back pain, bilateral flank pain, chills and increased frequency of micturition.  She was tachycardic with heart rate in the 140s and tachypneic in the emergency room.  She was found to have sepsis secondary to acute right pyelonephritis intussusception.  She was treated with IV fluids, empiric IV antibiotics and analgesics.  She was seen by the general surgeon and she underwent robotic laparoscopic small bowel resection       Assessment/Plan:   Principal Problem:   Sepsis ([place]) Active Problems:   Pyelonephritis   Intussusception intestine ([place])   Nicotine dependence   Body mass index is 24.8 kg/m.  Sepsis secondary to right pyelonephritis: Continue IV Rocephin.  Follow-up urine and blood cultures.  Intussusception: S/p robotic laparoscopic small bowel resection on 04/09/2022.  Continue analgesics as needed for pain.  She is on clear liquid diet.  Follow-up with general surgeon for further recommendations.  Hypotension: BP down to 81/55.  Patient said her BP normally runs low but she could not give me any numbers.  Normal saline bolus 500 cc has been ordered.  Monitor BP off IV fluids after bolus.  Tobacco use disorder: Counseled to quit smoking cigarettes.   Diet Order             Diet clear liquid Room service appropriate? Yes; Fluid consistency: Thin  Diet effective now                            Consultants: General surgeon  Procedures: Laparoscopic small bowel resection    Medications:    acetaminophen  1,000 mg Oral Q6H   enoxaparin (LOVENOX) injection  40 mg Subcutaneous Q24H   ketorolac  30 mg  Intravenous Q6H   Continuous Infusions:  cefTRIAXone (ROCEPHIN)  IV     lactated ringers 125 mL/hr at 04/10/22 0535     Anti-infectives (From admission, onward)    Start     Dose/Rate Route Frequency Ordered Stop   04/10/22 1130  cefTRIAXone (ROCEPHIN) 1 g in sodium chloride 0.9 % 100 mL IVPB        1 g 200 mL/hr over 30 Minutes Intravenous Every 24 hours 04/09/22 1228 04/17/22 1129   04/09/22 1245  metroNIDAZOLE (FLAGYL) IVPB 500 mg  Status:  Discontinued        500 mg 100 mL/hr over 60 Minutes Intravenous Every 12 hours 04/09/22 1238 04/10/22 1238   04/09/22 1115  cefTRIAXone (ROCEPHIN) 2 g in sodium chloride 0.9 % 100 mL IVPB        2 g 200 mL/hr over 30 Minutes Intravenous  Once 04/09/22 1107 04/09/22 1152              Family Communication/Anticipated D/C date and plan/Code Status   DVT prophylaxis: enoxaparin (LOVENOX) injection 40 mg Start: 04/09/22 2200 Place and maintain sequential compression device Start: 04/09/22 1239     Code Status: Full Code  Family Communication: Boyfriend at the bedside Disposition Plan: Plan to discharge home in 2 days   Status is: Inpatient Remains inpatient appropriate because: IV antibiotics, recent bowel resection  Subjective:   Interval events noted.  She complains of severe abdominal pain not responsive to Toradol.  She later had some relief with Dilaudid.  She also has some dysuria.  No flatus or bowel movement      Objective:    Vitals:   04/09/22 1737 04/09/22 1925 04/10/22 0532 04/10/22 0900  BP: (!) 105/59 99/69 (!) 89/59 (!) 88/54  Pulse: 86 91 (!) 59 (!) 58  Resp: 18 17 16    Temp: 98.7 F (37.1 C) 97.6 F (36.4 C) 98 F (36.7 C) 98.1 F (36.7 C)  TempSrc:  Oral  Oral  SpO2: 99% 99% 99% 100%  Weight:      Height:       No data found.   Intake/Output Summary (Last 24 hours) at 04/10/2022 1238 Last data filed at 04/10/2022 0037 Gross per 24 hour  Intake 1899.19 ml  Output 310 ml  Net  1589.19 ml   Filed Weights   04/09/22 0714  Weight: 63.5 kg    Exam:  GEN: NAD SKIN: No rash EYES: EOMI ENT: MMM CV: RRR PULM: CTA B ABD: soft, ND, mild surgical tenderness, no rebound tenderness or guarding, +BS, multiple small surgical incisions are intact CNS: AAO x 3, non focal EXT: No edema or tenderness GU: Right CVA tenderness        Data Reviewed:   I have personally reviewed following labs and imaging studies:  Labs: Labs show the following:   Basic Metabolic Panel: Recent Labs  Lab 04/09/22 0734 04/10/22 0645  NA 134* 140  K 3.8 4.2  CL 103 111  CO2 24 25  GLUCOSE 106* 116*  BUN 11 12  CREATININE 0.53 0.36*  CALCIUM 9.2 8.5*   GFR Estimated Creatinine Clearance: 95.6 mL/min (A) (by C-G formula based on SCr of 0.36 mg/dL (L)). Liver Function Tests: Recent Labs  Lab 04/09/22 0734  AST 18  ALT 12  ALKPHOS 69  BILITOT 0.8  PROT 7.5  ALBUMIN 4.1   No results for input(s): "LIPASE", "AMYLASE" in the last 168 hours. No results for input(s): "AMMONIA" in the last 168 hours. Coagulation profile Recent Labs  Lab 04/09/22 0734 04/10/22 0645  INR 1.0 1.1    CBC: Recent Labs  Lab 04/09/22 0734 04/10/22 0645  WBC 16.5* 17.6*  NEUTROABS 14.1*  --   HGB 12.1 9.9*  HCT 38.3 30.7*  MCV 79.3* 78.9*  PLT 319 259   Cardiac Enzymes: No results for input(s): "CKTOTAL", "CKMB", "CKMBINDEX", "TROPONINI" in the last 168 hours. BNP (last 3 results) No results for input(s): "PROBNP" in the last 8760 hours. CBG: No results for input(s): "GLUCAP" in the last 168 hours. D-Dimer: No results for input(s): "DDIMER" in the last 72 hours. Hgb A1c: No results for input(s): "HGBA1C" in the last 72 hours. Lipid Profile: No results for input(s): "CHOL", "HDL", "LDLCALC", "TRIG", "CHOLHDL", "LDLDIRECT" in the last 72 hours. Thyroid function studies: No results for input(s): "TSH", "T4TOTAL", "T3FREE", "THYROIDAB" in the last 72 hours.  Invalid  input(s): "FREET3" Anemia work up: No results for input(s): "VITAMINB12", "FOLATE", "FERRITIN", "TIBC", "IRON", "RETICCTPCT" in the last 72 hours. Sepsis Labs: Recent Labs  Lab 04/09/22 0734 04/09/22 1714 04/10/22 0645  PROCALCITON <0.10  --  <0.10  WBC 16.5*  --  17.6*  LATICACIDVEN 1.6 1.3  --     Microbiology Recent Results (from the past 240 hour(s))  Blood Culture (routine x 2)     Status: None (Preliminary result)   Collection Time: 04/09/22  7:34 AM   Specimen: BLOOD  Result Value Ref Range Status   Specimen Description BLOOD RIGHT ANTECUBITAL  Final   Special Requests   Final    BOTTLES DRAWN AEROBIC AND ANAEROBIC Blood Culture results may not be optimal due to an inadequate volume of blood received in culture bottles   Culture   Final    NO GROWTH < 24 HOURS Performed at [facility], [address], [place], [place] [address]    Report Status PENDING  Incomplete  Blood Culture (routine x 2)     Status: None (Preliminary result)   Collection Time: 04/09/22  7:34 AM   Specimen: BLOOD  Result Value Ref Range Status   Specimen Description BLOOD LEFT ANTECUBITAL  Final   Special Requests   Final    BOTTLES DRAWN AEROBIC AND ANAEROBIC Blood Culture adequate volume   Culture   Final    NO GROWTH < 24 HOURS Performed at [facility], [address] [place], [place] [address]    Report Status PENDING  Incomplete  Resp Panel by RT-PCR (Flu A&B, Covid) Anterior Nasal Swab     Status: None   Collection Time: 04/09/22  7:34 AM   Specimen: Anterior Nasal Swab  Result Value Ref Range Status   SARS Coronavirus 2 by RT PCR NEGATIVE NEGATIVE Final    Comment: (NOTE) SARS-CoV-2 target nucleic acids are NOT DETECTED.  The SARS-CoV-2 RNA is generally detectable in upper respiratory specimens during the acute phase of infection. The lowest concentration of SARS-CoV-2 viral copies this assay can detect is 138 copies/mL. A negative result does not preclude  SARS-Cov-2 infection and should not be used as the sole basis for treatment or other patient management decisions. A negative result may occur with  improper specimen collection/handling, submission of specimen other than nasopharyngeal swab, presence of viral mutation(s) within the areas targeted by this assay, and inadequate number of viral copies(<138 copies/mL). A negative result must be combined with clinical observations, patient history, and epidemiological information. The expected result is Negative.  Fact Sheet for Patients:  EntrepreneurPulse.com.au  Fact Sheet for Healthcare Providers:  IncredibleEmployment.be  This test is no t yet approved or cleared by the [place] FDA and  has been authorized for detection and/or diagnosis of SARS-CoV-2 by FDA under an Emergency Use Authorization (EUA). This EUA will remain  in effect (meaning this test can be used) for the duration of the COVID-19 declaration under Section 564(b)(1) of the Act, 21 U.S.C.section 360bbb-3(b)(1), unless the authorization is terminated  or revoked sooner.       Influenza A by PCR NEGATIVE NEGATIVE Final   Influenza B by PCR NEGATIVE NEGATIVE Final    Comment: (NOTE) The Xpert Xpress SARS-CoV-2/FLU/RSV plus assay is intended as an aid in the diagnosis of influenza from Nasopharyngeal swab specimens and should not be used as a sole basis for treatment. Nasal washings and aspirates are unacceptable for Xpert Xpress SARS-CoV-2/FLU/RSV testing.  Fact Sheet for Patients: EntrepreneurPulse.com.au  Fact Sheet for Healthcare Providers: IncredibleEmployment.be  This test is not yet approved or cleared by the [place] FDA and has been authorized for detection and/or diagnosis of SARS-CoV-2 by FDA under an Emergency Use Authorization (EUA). This EUA will remain in effect (meaning this test can be used) for the duration of  the COVID-19 declaration under Section 564(b)(1) of the Act, 21 U.S.C. section 360bbb-3(b)(1), unless the authorization is terminated or revoked.  Performed at [facility], [place],  [place], [place] [address]     Procedures and diagnostic studies:  CT ABDOMEN PELVIS W CONTRAST  Result Date: 04/09/2022 CLINICAL DATA:  Abdominal pain. EXAM: CT ABDOMEN AND PELVIS WITH CONTRAST TECHNIQUE: Multidetector CT imaging of the abdomen and pelvis was performed using the standard protocol following bolus administration of intravenous contrast. RADIATION DOSE REDUCTION: This exam was performed according to the departmental dose-optimization program which includes automated exposure control, adjustment of the mA and/or kV according to patient size and/or use of iterative reconstruction technique. CONTRAST:  182mL OMNIPAQUE IOHEXOL 300 MG/ML  SOLN COMPARISON:  06/22/2018 FINDINGS: Lower chest: Unremarkable. Hepatobiliary: No suspicious focal abnormality within the liver parenchyma. There is no evidence for gallstones, gallbladder wall thickening, or pericholecystic fluid. No intrahepatic or extrahepatic biliary dilation. Pancreas: No focal mass lesion. No dilatation of the main duct. No intraparenchymal cyst. No peripancreatic edema. Spleen: No splenomegaly. No focal mass lesion. Adrenals/Urinary Tract: No adrenal nodule or mass. Areas of cortical scarring noted right kidney. Segmental decreased perfusion noted upper pole right kidney with subtle perinephric edema/inflammation associated (axial 26/3 and coronal 61/6). Left kidney unremarkable. No hydroureter. The urinary bladder appears normal for the degree of distention. Stomach/Bowel: Stomach is unremarkable. No gastric wall thickening. No evidence of outlet obstruction. Duodenum is normally positioned as is the ligament of Treitz. Relatively long segment of jejunal intussusception identified in the left abdomen, measuring on the order of 15-20 cm  in length. There is some mild associated wall thickening but no perienteric edema and no proximal dilatation to suggest obstruction. The terminal ileum is normal. The appendix is normal. No gross colonic mass. No colonic wall thickening. Vascular/Lymphatic: No abdominal aortic aneurysm. No abdominal aortic atherosclerotic calcification. There is no gastrohepatic or hepatoduodenal ligament lymphadenopathy. No retroperitoneal or mesenteric lymphadenopathy. No pelvic sidewall lymphadenopathy. Reproductive: The uterus is unremarkable.  There is no adnexal mass. Other: No intraperitoneal free fluid. Musculoskeletal: No worrisome lytic or sclerotic osseous abnormality. IMPRESSION: 1. Segmental decreased perfusion in the upper pole right kidney with subtle perinephric edema/inflammation. Imaging features are highly suggestive of pyelonephritis. 2. Relatively long segment of jejunal intussusception in the left abdomen measuring on the order of 15-20 cm in length. There is some mild associated wall thickening but no perienteric edema and no proximal dilatation to suggest obstruction. No small bowel mass lesion or mesenteric lymphadenopathy to indicate a lead point. The length of the intussusception is longer than typically seen in the setting of transient peristalsis related intussusception. While this may be benign and transient, given the degree of bowel involvement, close follow-up recommended. 3. Otherwise unremarkable exam. Electronically Signed   By: Misty Stanley M.D.   On: 04/09/2022 10:18   DG Chest Port 1 View  Result Date: 04/09/2022 CLINICAL DATA:  Questionable sepsis EXAM: PORTABLE CHEST 1 VIEW COMPARISON:  09/06/2021 FINDINGS: Normal heart size and mediastinal contours. No acute infiltrate or edema. No effusion or pneumothorax. No acute osseous findings. IMPRESSION: Negative portable chest. Electronically Signed   By: Jorje Guild M.D.   On: 04/09/2022 07:58               LOS: 1 day    Nil Xiong  [facility]   Pager on [email or web address]. If 7PM-7AM, please contact night-coverage at [email or web address]     04/10/2022, 12:38 PM

## 2022-04-10 NOTE — Progress Notes (Addendum)
[facility] Day(s): 1.   Post op day(s): 1 Day Post-Op.   Interval History:  Patient seen and examined No acute events or new complaints overnight.  Patient reports she is feeling better than presentation but abdomen is sore expectedly No fever, chills, nausea, emesis Leukocytosis to 17.6K this morning; likely reactive from OR Hgb to 9.9; suspect this is dilutional Renal function normal; sCr - 0.36; UO - 300 ccs + unmeasured No flatus She is on rocephin for UTI She is on CLD She is ambulating to the bathroom  Vital signs in last 24 hours: [min-max] current  Temp:  [97.6 F (36.4 C)-99.4 F (37.4 C)] 98 F (36.7 C) (06/12 0532) Pulse Rate:  [59-111] 59 (06/12 0532) Resp:  [16-23] 16 (06/12 0532) BP: (89-106)/(59-71) 89/59 (06/12 0532) SpO2:  [99 %-100 %] 99 % (06/12 0532)     Height: 5\' 3"  (160 cm) Weight: 63.5 kg BMI (Calculated): 24.81   Intake/Output last 2 shifts:  06/11 0701 - 06/12 0700 In: 3999.2 [I.V.:1548; IV Piggyback:2451.2] Out: 310 [Urine:300; Blood:10]   Physical Exam:  Constitutional: alert, cooperative and no distress  Respiratory: breathing non-labored at rest  Cardiovascular: regular rate and sinus rhythm  Gastrointestinal: soft, incisional soreness, non-distended, no rebound/guarding Integumentary: Laparoscopic incisions are CDI with dermabond, no erythema or drainage   Labs:     Latest Ref Rng & Units 04/10/2022    6:45 AM 04/09/2022    7:34 AM 09/06/2021    3:59 AM  CBC  WBC 4.0 - 10.5 K/uL 17.6  16.5  8.8   Hemoglobin 12.0 - 15.0 g/dL 9.9  12.1  11.3   Hematocrit 36.0 - 46.0 % 30.7  38.3  35.4   Platelets 150 - 400 K/uL 259  319  380       Latest Ref Rng & Units 04/10/2022    6:45 AM 04/09/2022    7:34 AM 09/06/2021    3:59 AM  CMP  Glucose 70 - 99 mg/dL 116  106  114   BUN 6 - 20 mg/dL 12  11  11    Creatinine 0.44 - 1.00 mg/dL 0.36  0.53  0.61   Sodium 135 - 145 mmol/L 140  134  132    Potassium 3.5 - 5.1 mmol/L 4.2  3.8  3.6   Chloride 98 - 111 mmol/L 111  103  102   CO2 22 - 32 mmol/L 25  24  21    Calcium 8.9 - 10.3 mg/dL 8.5  9.2  9.2   Total Protein 6.5 - 8.1 g/dL  7.5    Total Bilirubin 0.3 - 1.2 mg/dL  0.8    Alkaline Phos 38 - 126 U/L  69    AST 15 - 41 U/L  18    ALT 0 - 44 U/L  12       Imaging studies: No new pertinent imaging studies   Assessment/Plan:  26 y.o. female 1 Day Post-Op s/p robotic assisted laparoscopic small bowel resection with intracorporeal anastomosis for small bowel volvulus   - Continue CLD for now; await ROBF and clinical improvement before advancement  - Continue IV Abx (rocephin); for UTI  - Continue IVF support - Monitor abdominal examination; ion-going bowel function   - Pain control prn; antiemetics prn   - Morning labs - Encouraged ambulation; OOB; engage PT if needed - Further management per primary service; we will of course follow   All of the above findings and recommendations  were discussed with the patient, patient's family at bedside, and the medical team, and all of their questions were answered to their expressed satisfaction.  -- Edison Simon, PA-C [facility] 04/10/2022, 8:15 AM M-F: 7am - 4pm

## 2022-04-11 DIAGNOSIS — N12 Tubulo-interstitial nephritis, not specified as acute or chronic: Secondary | ICD-10-CM | POA: Diagnosis not present

## 2022-04-11 DIAGNOSIS — A4151 Sepsis due to Escherichia coli [E. coli]: Principal | ICD-10-CM

## 2022-04-11 LAB — CBC
HCT: 28.5 % — ABNORMAL LOW (ref 36.0–46.0)
Hemoglobin: 8.9 g/dL — ABNORMAL LOW (ref 12.0–15.0)
MCH: 25.3 pg — ABNORMAL LOW (ref 26.0–34.0)
MCHC: 31.2 g/dL (ref 30.0–36.0)
MCV: 81 fL (ref 80.0–100.0)
Platelets: 232 10*3/uL (ref 150–400)
RBC: 3.52 MIL/uL — ABNORMAL LOW (ref 3.87–5.11)
RDW: 16.8 % — ABNORMAL HIGH (ref 11.5–15.5)
WBC: 8.4 10*3/uL (ref 4.0–10.5)
nRBC: 0 % (ref 0.0–0.2)

## 2022-04-11 LAB — BASIC METABOLIC PANEL
Anion gap: 1 — ABNORMAL LOW (ref 5–15)
BUN: 12 mg/dL (ref 6–20)
CO2: 26 mmol/L (ref 22–32)
Calcium: 8.2 mg/dL — ABNORMAL LOW (ref 8.9–10.3)
Chloride: 112 mmol/L — ABNORMAL HIGH (ref 98–111)
Creatinine, Ser: 0.51 mg/dL (ref 0.44–1.00)
GFR, Estimated: >60 mL/min (ref >60)
Glucose, Bld: 94 mg/dL (ref 70–99)
Potassium: 4 mmol/L (ref 3.5–5.1)
Sodium: 139 mmol/L (ref 135–145)

## 2022-04-11 LAB — URINE CULTURE: Culture: >=100000 — AB

## 2022-04-11 LAB — PHOSPHORUS: Phosphorus: 2.9 mg/dL (ref 2.5–4.6)

## 2022-04-11 LAB — IRON AND TIBC
Iron: 14 ug/dL — ABNORMAL LOW (ref 28–170)
Saturation Ratios: 5 % — ABNORMAL LOW (ref 10.4–31.8)
TIBC: 312 ug/dL (ref 250–450)
UIBC: 298 ug/dL

## 2022-04-11 LAB — MAGNESIUM: Magnesium: 1.9 mg/dL (ref 1.7–2.4)

## 2022-04-11 LAB — PROCALCITONIN: Procalcitonin: <0.1 ng/mL

## 2022-04-11 LAB — SURGICAL PATHOLOGY

## 2022-04-11 LAB — FERRITIN: Ferritin: 47 ng/mL (ref 11–307)

## 2022-04-11 MED ORDER — LEVOFLOXACIN 750 MG PO TABS
750.0000 mg | ORAL_TABLET | Freq: Every day | ORAL | Status: DC
  Administered 2022-04-11 – 2022-04-12 (×2): 750 mg via ORAL
  Filled 2022-04-11 (×2): qty 1

## 2022-04-11 MED ORDER — CEPHALEXIN 500 MG PO CAPS: 500.0000 mg | ORAL_CAPSULE | Freq: Four times a day (QID) | ORAL | Status: DC

## 2022-04-11 NOTE — Progress Notes (Addendum)
Progress Note    Melissa Coffey  [id number] DOB: 1996/09/12  DOA: 04/09/2022 PCP: Patient, No Pcp Per (Inactive)      Brief Narrative:    Medical records reviewed and are as summarized below:  Melissa Coffey is a 26 y.o. female with medical history significant for depression, anemia, endometriosis, presented to the hospital with low back pain, bilateral flank pain, chills and increased frequency of micturition.  She was tachycardic with heart rate in the 140s and tachypneic in the emergency room.  She was found to have sepsis secondary to acute right pyelonephritis and intussusception.  She was treated with IV fluids, empiric IV antibiotics and analgesics.  Urine culture showed E. coli and antibiotics was changed from IV ceftriaxone to oral Levaquin.  She was seen by the general surgeon and she underwent robotic laparoscopic small bowel resection       Assessment/Plan:   Principal Problem:   Sepsis ([place]) Active Problems:   Pyelonephritis   Intussusception intestine ([place])   Nicotine dependence   Body mass index is 24.8 kg/m.  E. coli sepsis secondary to right pyelonephritis: Urine culture showed E. coli.  No growth on blood cultures thus far.  Change IV ceftriaxone to oral Levaquin.   Intussusception: S/p robotic laparoscopic small bowel resection on 04/09/2022.  Diet has been advanced from clear to full liquid diet.  Continue analgesics as needed for pain.  Follow-up with general surgeon for disposition.    Hypotension: BP is better.  It appears she also has baseline low blood pressure.  Monitor BP off IV fluids.  Anemia, probably from acute blood loss: No indication for blood transfusion at this time.  Monitor H&H and check iron studies.  Tobacco use disorder: Counseled to quit smoking cigarettes.   Diet Order             Diet full liquid Room service appropriate? Yes; Fluid consistency: Thin  Diet effective now                             Consultants: General surgeon  Procedures: Laparoscopic small bowel resection    Medications:    acetaminophen  1,000 mg Oral Q6H   enoxaparin (LOVENOX) injection  40 mg Subcutaneous Q24H   ketorolac  30 mg Intravenous Q6H   levofloxacin  750 mg Oral Daily   lidocaine  2 patch Transdermal Q24H   Continuous Infusions:     Anti-infectives (From admission, onward)    Start     Dose/Rate Route Frequency Ordered Stop   04/11/22 1400  cephALEXin (KEFLEX) capsule 500 mg  Status:  Discontinued        500 mg Oral 4 times daily 04/11/22 1004 04/11/22 1148   04/11/22 1245  levofloxacin (LEVAQUIN) tablet 750 mg        750 mg Oral Daily 04/11/22 1148 04/14/22 0959   04/10/22 1130  cefTRIAXone (ROCEPHIN) 1 g in sodium chloride 0.9 % 100 mL IVPB  Status:  Discontinued        1 g 200 mL/hr over 30 Minutes Intravenous Every 24 hours 04/09/22 1228 04/11/22 1004   04/09/22 1245  metroNIDAZOLE (FLAGYL) IVPB 500 mg  Status:  Discontinued        500 mg 100 mL/hr over 60 Minutes Intravenous Every 12 hours 04/09/22 1238 04/10/22 1238   04/09/22 1115  cefTRIAXone (ROCEPHIN) 2 g in sodium chloride 0.9 % 100 mL IVPB  2 g 200 mL/hr over 30 Minutes Intravenous  Once 04/09/22 1107 04/09/22 1152              Family Communication/Anticipated D/C date and plan/Code Status   DVT prophylaxis: enoxaparin (LOVENOX) injection 40 mg Start: 04/09/22 2200 Place and maintain sequential compression device Start: 04/09/22 1239     Code Status: Full Code  Family Communication: None Disposition Plan: Plan to discharge home in 2 days   Status is: Inpatient Remains inpatient appropriate because: S/p small bowel resection       Subjective:   Interval events noted.  She still has some abdominal pain.  She has passed flatus but has not moved her bowels yet.  Objective:    Vitals:   04/10/22 2057 04/11/22 0500 04/11/22 0500 04/11/22 0754  BP: 90/60  (!) 89/55 (!)  90/58  Pulse: 67  68 70  Resp: 17  18   Temp: 98 F (36.7 C)  98.2 F (36.8 C) 97.9 F (36.6 C)  TempSrc:  Oral Oral   SpO2: 96%  97% 95%  Weight:      Height:       No data found.   Intake/Output Summary (Last 24 hours) at 04/11/2022 1547 Last data filed at 04/11/2022 1422 Gross per 24 hour  Intake 2892.7 ml  Output --  Net 2892.7 ml   Filed Weights   04/09/22 0714  Weight: 63.5 kg    Exam:   GEN: NAD SKIN: No rash EYES: EOMI ENT: MMM CV: RRR PULM: CTA B ABD: soft, ND, mild surgical tenderness, no rebound tenderness or guarding, +BS CNS: AAO x 3, non focal EXT: No edema or tenderness GU: Right CVA tenderness        Data Reviewed:   I have personally reviewed following labs and imaging studies:  Labs: Labs show the following:   Basic Metabolic Panel: Recent Labs  Lab 04/09/22 0734 04/10/22 0645 04/11/22 0518  NA 134* 140 139  K 3.8 4.2 4.0  CL 103 111 112*  CO2 24 25 26   GLUCOSE 106* 116* 94  BUN 11 12 12   CREATININE 0.53 0.36* 0.51  CALCIUM 9.2 8.5* 8.2*  MG  --   --  1.9  PHOS  --   --  2.9   GFR Estimated Creatinine Clearance: 95.6 mL/min (by C-G formula based on SCr of 0.51 mg/dL). Liver Function Tests: Recent Labs  Lab 04/09/22 0734  AST 18  ALT 12  ALKPHOS 69  BILITOT 0.8  PROT 7.5  ALBUMIN 4.1   No results for input(s): "LIPASE", "AMYLASE" in the last 168 hours. No results for input(s): "AMMONIA" in the last 168 hours. Coagulation profile Recent Labs  Lab 04/09/22 0734 04/10/22 0645  INR 1.0 1.1    CBC: Recent Labs  Lab 04/09/22 0734 04/10/22 0645 04/11/22 0518  WBC 16.5* 17.6* 8.4  NEUTROABS 14.1*  --   --   HGB 12.1 9.9* 8.9*  HCT 38.3 30.7* 28.5*  MCV 79.3* 78.9* 81.0  PLT 319 259 232   Cardiac Enzymes: No results for input(s): "CKTOTAL", "CKMB", "CKMBINDEX", "TROPONINI" in the last 168 hours. BNP (last 3 results) No results for input(s): "PROBNP" in the last 8760 hours. CBG: No results for input(s):  "GLUCAP" in the last 168 hours. D-Dimer: No results for input(s): "DDIMER" in the last 72 hours. Hgb A1c: No results for input(s): "HGBA1C" in the last 72 hours. Lipid Profile: No results for input(s): "CHOL", "HDL", "LDLCALC", "TRIG", "CHOLHDL", "LDLDIRECT" in the last  72 hours. Thyroid function studies: No results for input(s): "TSH", "T4TOTAL", "T3FREE", "THYROIDAB" in the last 72 hours.  Invalid input(s): "FREET3" Anemia work up: No results for input(s): "VITAMINB12", "FOLATE", "FERRITIN", "TIBC", "IRON", "RETICCTPCT" in the last 72 hours. Sepsis Labs: Recent Labs  Lab 04/09/22 0734 04/09/22 1714 04/10/22 0645 04/11/22 0518  PROCALCITON <0.10  --  <0.10 <0.10  WBC 16.5*  --  17.6* 8.4  LATICACIDVEN 1.6 1.3  --   --     Microbiology Recent Results (from the past 240 hour(s))  Urine Culture     Status: Abnormal   Collection Time: 04/09/22  7:23 AM   Specimen: Urine, Random  Result Value Ref Range Status   Specimen Description   Final    URINE, RANDOM Performed at [facility], [address] [place], [place] [address]    Special Requests   Final    NONE Performed at [facility], [address] [place], [place] [address]    Culture >=100,000 COLONIES/mL ESCHERICHIA COLI (A)  Final   Report Status 04/11/2022 FINAL  Final   Organism ID, Bacteria ESCHERICHIA COLI (A)  Final      Susceptibility   Escherichia coli - MIC*    AMPICILLIN 16 INTERMEDIATE Intermediate     CEFAZOLIN <=4 SENSITIVE Sensitive     CEFEPIME <=0.12 SENSITIVE Sensitive     CEFTRIAXONE <=0.25 SENSITIVE Sensitive     CIPROFLOXACIN <=0.25 SENSITIVE Sensitive     GENTAMICIN <=1 SENSITIVE Sensitive     IMIPENEM <=0.25 SENSITIVE Sensitive     NITROFURANTOIN <=16 SENSITIVE Sensitive     TRIMETH/SULFA <=20 SENSITIVE Sensitive     AMPICILLIN/SULBACTAM 4 SENSITIVE Sensitive     PIP/TAZO <=4 SENSITIVE Sensitive     * >=100,000 COLONIES/mL ESCHERICHIA COLI  Blood Culture (routine x  2)     Status: None (Preliminary result)   Collection Time: 04/09/22  7:34 AM   Specimen: BLOOD  Result Value Ref Range Status   Specimen Description BLOOD RIGHT ANTECUBITAL  Final   Special Requests   Final    BOTTLES DRAWN AEROBIC AND ANAEROBIC Blood Culture results may not be optimal due to an inadequate volume of blood received in culture bottles   Culture   Final    NO GROWTH 2 DAYS Performed at [facility], [address] [place], [place] [address]    Report Status PENDING  Incomplete  Blood Culture (routine x 2)     Status: None (Preliminary result)   Collection Time: 04/09/22  7:34 AM   Specimen: BLOOD  Result Value Ref Range Status   Specimen Description BLOOD LEFT ANTECUBITAL  Final   Special Requests   Final    BOTTLES DRAWN AEROBIC AND ANAEROBIC Blood Culture adequate volume   Culture   Final    NO GROWTH 2 DAYS Performed at [facility], [address], [place], [place] [address]    Report Status PENDING  Incomplete  Resp Panel by RT-PCR (Flu A&B, Covid) Anterior Nasal Swab     Status: None   Collection Time: 04/09/22  7:34 AM   Specimen: Anterior Nasal Swab  Result Value Ref Range Status   SARS Coronavirus 2 by RT PCR NEGATIVE NEGATIVE Final    Comment: (NOTE) SARS-CoV-2 target nucleic acids are NOT DETECTED.  The SARS-CoV-2 RNA is generally detectable in upper respiratory specimens during the acute phase of infection. The lowest concentration of SARS-CoV-2 viral copies this assay can detect is 138 copies/mL. A negative result does  not preclude SARS-Cov-2 infection and should not be used as the sole basis for treatment or other patient management decisions. A negative result may occur with  improper specimen collection/handling, submission of specimen other than nasopharyngeal swab, presence of viral mutation(s) within the areas targeted by this assay, and inadequate number of viral copies(<138 copies/mL). A negative result must be combined  with clinical observations, patient history, and epidemiological information. The expected result is Negative.  Fact Sheet for Patients:  EntrepreneurPulse.com.au  Fact Sheet for Healthcare Providers:  IncredibleEmployment.be  This test is no t yet approved or cleared by the [place] FDA and  has been authorized for detection and/or diagnosis of SARS-CoV-2 by FDA under an Emergency Use Authorization (EUA). This EUA will remain  in effect (meaning this test can be used) for the duration of the COVID-19 declaration under Section 564(b)(1) of the Act, 21 U.S.C.section 360bbb-3(b)(1), unless the authorization is terminated  or revoked sooner.       Influenza A by PCR NEGATIVE NEGATIVE Final   Influenza B by PCR NEGATIVE NEGATIVE Final    Comment: (NOTE) The Xpert Xpress SARS-CoV-2/FLU/RSV plus assay is intended as an aid in the diagnosis of influenza from Nasopharyngeal swab specimens and should not be used as a sole basis for treatment. Nasal washings and aspirates are unacceptable for Xpert Xpress SARS-CoV-2/FLU/RSV testing.  Fact Sheet for Patients: EntrepreneurPulse.com.au  Fact Sheet for Healthcare Providers: IncredibleEmployment.be  This test is not yet approved or cleared by the [place] FDA and has been authorized for detection and/or diagnosis of SARS-CoV-2 by FDA under an Emergency Use Authorization (EUA). This EUA will remain in effect (meaning this test can be used) for the duration of the COVID-19 declaration under Section 564(b)(1) of the Act, 21 U.S.C. section 360bbb-3(b)(1), unless the authorization is terminated or revoked.  Performed at [facility], [address] [place], [place] [address]     Procedures and diagnostic studies:  No results found.             LOS: 2 days   Kalecia Hartney  [facility]   Pager on [email or web address]. If 7PM-7AM,  please contact night-coverage at [email or web address]     04/11/2022, 3:47 PM

## 2022-04-11 NOTE — Plan of Care (Signed)

## 2022-04-11 NOTE — TOC CM/SW Note (Signed)
  Transition of Care [facility]) Screening Note   Patient Details  Name: Melissa Coffey Date of Birth: [birth year]   Transition of Care [facility]) CM/SW Contact:    Margarito Liner, LCSW Phone Number: 04/11/2022, 2:13 PM    Transition of Care Department [facility]) has reviewed patient and no TOC needs have been identified at this time. We will continue to monitor patient advancement through interdisciplinary progression rounds. If new patient transition needs arise, please place a TOC consult.

## 2022-04-11 NOTE — Progress Notes (Incomplete)
       CROSS COVER NOTE  NAME: Melissa Coffey MRN: [id number] DOB : February 15, 1996          CROSS COVER NOTE  NAME: Melissa Coffey MRN: [id number] DOB : [birth year]    Date of Service   04/11/2022  HPI/Events of Note   Hypotension  Interventions   Plan:  X X

## 2022-04-11 NOTE — Progress Notes (Signed)
[facility] SURGICAL PROGRESS NOTE  Hospital Day(s): 2.   Post op day(s): 2 Days Post-Op.   Interval History:  Patient seen and examined No acute events or new complaints overnight.  Patient looks objectively better Abdomen is still expectedly sore but she is able to move around more No fever, chills, nausea, emesis Leukocytosis now resolved this morning; 8.4K Hgb to 8.9; suspect this is dilutional Renal function normal; sCr - 0.51; UO - unmeasured She is on rocephin for UTI She is on CLD; tolerating well. Asking for something more solid She is ambulating to the bathroom She is passing flatus   Vital signs in last 24 hours: [min-max] current  Temp:  [97.7 F (36.5 C)-98.2 F (36.8 C)] 97.9 F (36.6 C) (06/13 0754) Pulse Rate:  [58-79] 70 (06/13 0754) Resp:  [16-18] 18 (06/13 0500) BP: (81-90)/(48-60) 90/58 (06/13 0754) SpO2:  [95 %-100 %] 95 % (06/13 0754)     Height: 5\' 3"  (160 cm) Weight: 63.5 kg BMI (Calculated): 24.81   Intake/Output last 2 shifts:  06/12 0701 - 06/13 0700 In: 2082.7 [I.V.:1535.2; IV Piggyback:547.5] Out: -    Physical Exam:  Constitutional: alert, cooperative and no distress  Respiratory: breathing non-labored at rest  Cardiovascular: regular rate and sinus rhythm  Gastrointestinal: soft, incisional soreness, non-distended, no rebound/guarding Integumentary: Laparoscopic incisions are CDI with dermabond, no erythema or drainage   Labs:     Latest Ref Rng & Units 04/11/2022    5:18 AM 04/10/2022    6:45 AM 04/09/2022    7:34 AM  CBC  WBC 4.0 - 10.5 K/uL 8.4  17.6  16.5   Hemoglobin 12.0 - 15.0 g/dL 8.9  9.9  06/09/2022   Hematocrit 36.0 - 46.0 % 28.5  30.7  38.3   Platelets 150 - 400 K/uL 232  259  319       Latest Ref Rng & Units 04/11/2022    5:18 AM 04/10/2022    6:45 AM 04/09/2022    7:34 AM  CMP  Glucose 70 - 99 mg/dL 94  06/09/2022  665   BUN 6 - 20 mg/dL 12  12  11    Creatinine 0.44 - 1.00 mg/dL 993   5.70   Sodium 135 -  145 mmol/L 139  140  134   Potassium 3.5 - 5.1 mmol/L 4.0  4.2  3.8   Chloride 98 - 111 mmol/L 112  111  103   CO2 22 - 32 mmol/L 26  25  24    Calcium 8.9 - 10.3 mg/dL 8.2  8.5  9.2   Total Protein 6.5 - 8.1 g/dL   7.5   Total Bilirubin 0.3 - 1.2 mg/dL   0.8   Alkaline Phos 38 - 126 U/L   69   AST 15 - 41 U/L   18   ALT 0 - 44 U/L   12      Imaging studies: No new pertinent imaging studies   Assessment/Plan:  26 y.o. female with ROBF 2 Days Post-Op s/p robotic assisted laparoscopic small bowel resection with intracorporeal anastomosis for small bowel volvulus   - Advance to full liquid diet this morning. If she does well today with continued flatus, she may be able to advance to soft diet tonight vs tomorrow   - Continue IV Abx (rocephin); for UTI - Monitor abdominal examination; ion-going bowel function   - Pain control prn; antiemetics prn   - Morning labs - Encouraged ambulation; OOB; engage PT if  needed - Further management per primary service; we will of course follow   All of the above findings and recommendations were discussed with the patient, patient's family at bedside, and the medical team, and all of their questions were answered to their expressed satisfaction.  -- Lynden Oxford, PA-C [facility] 04/11/2022, 7:56 AM M-F: 7am - 4pm

## 2022-04-12 ENCOUNTER — Encounter: Payer: Self-pay | Admitting: Oncology

## 2022-04-12 LAB — CBC
HCT: 31.8 % — ABNORMAL LOW (ref 36.0–46.0)
Hemoglobin: 9.9 g/dL — ABNORMAL LOW (ref 12.0–15.0)
MCH: 24.7 pg — ABNORMAL LOW (ref 26.0–34.0)
MCHC: 31.1 g/dL (ref 30.0–36.0)
MCV: 79.3 fL — ABNORMAL LOW (ref 80.0–100.0)
Platelets: 277 10*3/uL (ref 150–400)
RBC: 4.01 MIL/uL (ref 3.87–5.11)
RDW: 16.5 % — ABNORMAL HIGH (ref 11.5–15.5)
WBC: 7.9 10*3/uL (ref 4.0–10.5)
nRBC: 0 % (ref 0.0–0.2)

## 2022-04-12 LAB — BASIC METABOLIC PANEL
Anion gap: 5 (ref 5–15)
BUN: 10 mg/dL (ref 6–20)
CO2: 27 mmol/L (ref 22–32)
Calcium: 8.4 mg/dL — ABNORMAL LOW (ref 8.9–10.3)
Chloride: 106 mmol/L (ref 98–111)
Creatinine, Ser: 0.59 mg/dL (ref 0.44–1.00)
GFR, Estimated: >60 mL/min (ref >60)
Glucose, Bld: 92 mg/dL (ref 70–99)
Potassium: 4 mmol/L (ref 3.5–5.1)
Sodium: 138 mmol/L (ref 135–145)

## 2022-04-12 MED ORDER — OXYCODONE HCL 5 MG PO TABS: 5.0000 mg | ORAL_TABLET | Freq: Four times a day (QID) | ORAL | 0 refills | Status: AC | PRN

## 2022-04-12 MED ORDER — CEFADROXIL 500 MG PO CAPS: 1000.0000 mg | ORAL_CAPSULE | Freq: Two times a day (BID) | ORAL | 0 refills | Status: AC

## 2022-04-12 NOTE — Progress Notes (Signed)
Patient medically cleared with discharge order placed by MD.  Patient received AVS including education from primary RN.  Patient's PIV removed prior to discharge with patient transported off the unit by volunteer @1307 .

## 2022-04-12 NOTE — Plan of Care (Signed)
  Problem: Education: Goal: Knowledge of General Education information will improve Description: Including pain rating scale, medication(s)/side effects and non-pharmacologic comfort measures Outcome: Progressing   Problem: Clinical Measurements: Goal: Ability to maintain clinical measurements within normal limits will improve Outcome: Progressing   

## 2022-04-12 NOTE — Progress Notes (Addendum)
[facility] SURGICAL PROGRESS NOTE  Hospital Day(s): 3.   Post op day(s): 3 Days Post-Op.   Interval History:  Patient seen and examined No acute events or new complaints overnight.  Patient looks objectively better Abdomen is still expectedly sore but she is able to move around more No fever, chills, nausea, emesis Labs are reassuring She is on rocephin for UTI She is on soft diet; tolerating well She is ambulating to the bathroom She is passing flatus   Vital signs in last 24 hours: [min-max] current  Temp:  [97.9 F (36.6 C)-98.6 F (37 C)] 98.6 F (37 C) (06/14 0504) Pulse Rate:  [70-84] 75 (06/14 0504) Resp:  [16-21] 17 (06/14 0504) BP: (85-98)/(45-71) 98/71 (06/14 0504) SpO2:  [95 %-100 %] 99 % (06/14 0504)     Height: 5\' 3"  (160 cm) Weight: 63.5 kg BMI (Calculated): 24.81   Intake/Output last 2 shifts:  06/13 0701 - 06/14 0700 In: 810 [P.O.:810] Out: -    Physical Exam:  Constitutional: alert, cooperative and no distress  Respiratory: breathing non-labored at rest  Cardiovascular: regular rate and sinus rhythm  Gastrointestinal: soft, incisional soreness, non-distended, no rebound/guarding Integumentary: Laparoscopic incisions are CDI with dermabond, no erythema or drainage   Labs:     Latest Ref Rng & Units 04/12/2022    4:49 AM 04/11/2022    5:18 AM 04/10/2022    6:45 AM  CBC  WBC 4.0 - 10.5 K/uL 7.9  8.4  17.6   Hemoglobin 12.0 - 15.0 g/dL 9.9  8.9  9.9   Hematocrit 36.0 - 46.0 % 31.8  28.5  30.7   Platelets 150 - 400 K/uL 277  232  259       Latest Ref Rng & Units 04/12/2022    4:49 AM 04/11/2022    5:18 AM 04/10/2022    6:45 AM  CMP  Glucose 70 - 99 mg/dL 92  94  06/10/2022   BUN 6 - 20 mg/dL 10  12  12    Creatinine 0.44 - 1.00 mg/dL 675   9.16   Sodium 135 - 145 mmol/L 138  139  140   Potassium 3.5 - 5.1 mmol/L 4.0  4.0  4.2   Chloride 98 - 111 mmol/L 106  112  111   CO2 22 - 32 mmol/L 27  26  25    Calcium 8.9 - 10.3 mg/dL 8.4   8.2  8.5      Imaging studies: No new pertinent imaging studies   Assessment/Plan:  26 y.o. female with ROBF 3 Days Post-Op s/p robotic assisted laparoscopic small bowel resection with intracorporeal anastomosis for small bowel volvulus   - Okay to continue soft diet  - Continue IV Abx (rocephin); for UTI - Monitor abdominal examination; ion-going bowel function   - Pain control prn; antiemetics prn  - Encouraged ambulation; OOB; engage PT if needed - Further management per primary service; we will of course follow    - Discharge Planning: Okay for discharge from surgical perspective, follow up in 2 weeks. I did work note for her and updated DC. She will need pain Rx for home.   All of the above findings and recommendations were discussed with the patient, patient's family at bedside, and the medical team, and all of their questions were answered to their expressed satisfaction.  -- 6.65, PA-C [facility] 04/12/2022, 7:48 AM M-F: 7am - 4pm

## 2022-04-12 NOTE — Discharge Summary (Signed)
Physician Discharge Summary   Patient: Melissa Coffey MRN: [id number] DOB: 1996-08-31  Admit date:     04/09/2022  Discharge date: 04/12/22  Discharge Physician: Patrecia Pour   PCP: Patient, No Pcp Per   Recommendations at discharge:  Follow up with surgery in 2 weeks s/p robotic-assisted laparoscopic small bowel resection with intracorporeal anastomosis for small bowel volvulus on 6/11.   Discharge Diagnoses: Principal Problem:   Sepsis ([place]) Active Problems:   Pyelonephritis   Intussusception intestine ([place])   Nicotine dependence  Hospital Course: Melissa Coffey is a 26 y.o. female with medical history significant for depression, anemia, endometriosis, presented to the hospital with low back pain, bilateral flank pain, chills and increased frequency of micturition.  She was tachycardic with heart rate in the 140s and tachypneic in the emergency room   She was found to have sepsis secondary to acute right pyelonephritis and intussusception.  She was treated with IV fluids, empiric IV antibiotics and analgesics.  Urine culture showed E. coli and antibiotics was changed from IV ceftriaxone to oral Levaquin.  She was seen by the general surgeon and she underwent robotic laparoscopic small bowel resection.   She has been cleared for discharge from a surgical perspective on 6/14. She will complete a course of antibiotics with cefadroxil.  Assessment and Plan: E. coli sepsis secondary to right pyelonephritis: Urine culture showed E. coli, susceptibilities discussed with ID pharmacist, will transition to cefadroxil to complete protracted therapy. Blood cultures remain NGTD x3 days on day of discharge.   Intussusception, volvulus: S/p robotic laparoscopic small bowel resection on 04/09/2022.   - Continue oral medications for pain control, tolerating advanced diet and having flatus. Ok to DC per surgery with plans to follow up in clinic. Return precautions advised as well.    Hypotension: BP  is better.  It appears she also has baseline low blood pressure.    Anemia, probably from acute blood loss: No indication for blood transfusion at this time. Hgb stable at 9.9g/dl.  - Consider iron supplementation outside scope of active sepsis.    Tobacco use disorder: Counseled to quit smoking cigarettes.  Consultants: Surgery Procedures performed: small bowel resection 6/11.  Disposition: Home Diet recommendation: Full liquid > advance as tolerated DISCHARGE MEDICATION: Allergies as of 04/12/2022   No Known Allergies      Medication List     TAKE these medications    cefadroxil 500 MG capsule Commonly known as: DURICEF Take 2 capsules (1,000 mg total) by mouth 2 (two) times daily for 10 days.   oxyCODONE 5 MG immediate release tablet Commonly known as: Roxicodone Take 1 tablet (5 mg total) by mouth every 6 (six) hours as needed for up to 5 days for moderate pain or severe pain.        Follow-up Information     Tylene Fantasia, PA-C. Go on 04/27/2022.   Specialty: Physician Assistant Why: s/p robotic assisted laparoscopic small bowel resection 1:45pm appointment Contact information: [address] [place] [address] [phone]                Discharge Exam: Danley Danker Weights   04/09/22 0714  Weight: 63.5 kg  BP 105/66 (BP Location: Right Arm)   Pulse 88   Temp 98.6 F (37 C) (Oral)   Resp 16   Ht 5\' 3"  (1.6 m)   Wt 63.5 kg   LMP 03/26/2022 (Approximate)   SpO2 100%   BMI 24.80 kg/m   Well-appearing young  female in no distress Clear, nonlabored RRR, no MRG or edema Abd soft, appropriately diffusely tender with no distention, +BS.  Laparoscopic incision sites w/dermabond, no discharge, erythema.   Condition at discharge: good  The results of significant diagnostics from this hospitalization (including imaging, microbiology, ancillary and laboratory) are listed below for reference.   Imaging Studies: CT ABDOMEN PELVIS W  CONTRAST  Result Date: 04/09/2022 CLINICAL DATA:  Abdominal pain. EXAM: CT ABDOMEN AND PELVIS WITH CONTRAST TECHNIQUE: Multidetector CT imaging of the abdomen and pelvis was performed using the standard protocol following bolus administration of intravenous contrast. RADIATION DOSE REDUCTION: This exam was performed according to the departmental dose-optimization program which includes automated exposure control, adjustment of the mA and/or kV according to patient size and/or use of iterative reconstruction technique. CONTRAST:  160mL OMNIPAQUE IOHEXOL 300 MG/ML  SOLN COMPARISON:  06/22/2018 FINDINGS: Lower chest: Unremarkable. Hepatobiliary: No suspicious focal abnormality within the liver parenchyma. There is no evidence for gallstones, gallbladder wall thickening, or pericholecystic fluid. No intrahepatic or extrahepatic biliary dilation. Pancreas: No focal mass lesion. No dilatation of the main duct. No intraparenchymal cyst. No peripancreatic edema. Spleen: No splenomegaly. No focal mass lesion. Adrenals/Urinary Tract: No adrenal nodule or mass. Areas of cortical scarring noted right kidney. Segmental decreased perfusion noted upper pole right kidney with subtle perinephric edema/inflammation associated (axial 26/3 and coronal 61/6). Left kidney unremarkable. No hydroureter. The urinary bladder appears normal for the degree of distention. Stomach/Bowel: Stomach is unremarkable. No gastric wall thickening. No evidence of outlet obstruction. Duodenum is normally positioned as is the ligament of Treitz. Relatively long segment of jejunal intussusception identified in the left abdomen, measuring on the order of 15-20 cm in length. There is some mild associated wall thickening but no perienteric edema and no proximal dilatation to suggest obstruction. The terminal ileum is normal. The appendix is normal. No gross colonic mass. No colonic wall thickening. Vascular/Lymphatic: No abdominal aortic aneurysm. No  abdominal aortic atherosclerotic calcification. There is no gastrohepatic or hepatoduodenal ligament lymphadenopathy. No retroperitoneal or mesenteric lymphadenopathy. No pelvic sidewall lymphadenopathy. Reproductive: The uterus is unremarkable.  There is no adnexal mass. Other: No intraperitoneal free fluid. Musculoskeletal: No worrisome lytic or sclerotic osseous abnormality. IMPRESSION: 1. Segmental decreased perfusion in the upper pole right kidney with subtle perinephric edema/inflammation. Imaging features are highly suggestive of pyelonephritis. 2. Relatively long segment of jejunal intussusception in the left abdomen measuring on the order of 15-20 cm in length. There is some mild associated wall thickening but no perienteric edema and no proximal dilatation to suggest obstruction. No small bowel mass lesion or mesenteric lymphadenopathy to indicate a lead point. The length of the intussusception is longer than typically seen in the setting of transient peristalsis related intussusception. While this may be benign and transient, given the degree of bowel involvement, close follow-up recommended. 3. Otherwise unremarkable exam. Electronically Signed   By: Misty Stanley M.D.   On: 04/09/2022 10:18   DG Chest Port 1 View  Result Date: 04/09/2022 CLINICAL DATA:  Questionable sepsis EXAM: PORTABLE CHEST 1 VIEW COMPARISON:  09/06/2021 FINDINGS: Normal heart size and mediastinal contours. No acute infiltrate or edema. No effusion or pneumothorax. No acute osseous findings. IMPRESSION: Negative portable chest. Electronically Signed   By: Jorje Guild M.D.   On: 04/09/2022 07:58    Microbiology: Results for orders placed or performed during the hospital encounter of 04/09/22  Urine Culture     Status: Abnormal   Collection Time: 04/09/22  7:23 AM  Specimen: Urine, Random  Result Value Ref Range Status   Specimen Description   Final    URINE, RANDOM Performed at [facility], [address] [place], [place] [address]    Special Requests   Final    NONE Performed at [facility], [address] [place], [place] [address]    Culture >=100,000 COLONIES/mL ESCHERICHIA COLI (A)  Final   Report Status 04/11/2022 FINAL  Final   Organism ID, Bacteria ESCHERICHIA COLI (A)  Final      Susceptibility   Escherichia coli - MIC*    AMPICILLIN 16 INTERMEDIATE Intermediate     CEFAZOLIN <=4 SENSITIVE Sensitive     CEFEPIME <=0.12 SENSITIVE Sensitive     CEFTRIAXONE <=0.25 SENSITIVE Sensitive     CIPROFLOXACIN <=0.25 SENSITIVE Sensitive     GENTAMICIN <=1 SENSITIVE Sensitive     IMIPENEM <=0.25 SENSITIVE Sensitive     NITROFURANTOIN <=16 SENSITIVE Sensitive     TRIMETH/SULFA <=20 SENSITIVE Sensitive     AMPICILLIN/SULBACTAM 4 SENSITIVE Sensitive     PIP/TAZO <=4 SENSITIVE Sensitive     * >=100,000 COLONIES/mL ESCHERICHIA COLI  Blood Culture (routine x 2)     Status: None (Preliminary result)   Collection Time: 04/09/22  7:34 AM   Specimen: BLOOD  Result Value Ref Range Status   Specimen Description BLOOD RIGHT ANTECUBITAL  Final   Special Requests   Final    BOTTLES DRAWN AEROBIC AND ANAEROBIC Blood Culture results may not be optimal due to an inadequate volume of blood received in culture bottles   Culture   Final    NO GROWTH 3 DAYS Performed at [facility], [address] [place], [place] [address]    Report Status PENDING  Incomplete  Blood Culture (routine x 2)     Status: None (Preliminary result)   Collection Time: 04/09/22  7:34 AM   Specimen: BLOOD  Result Value Ref Range Status   Specimen Description BLOOD LEFT ANTECUBITAL  Final   Special Requests   Final    BOTTLES DRAWN AEROBIC AND ANAEROBIC Blood Culture adequate volume   Culture   Final    NO GROWTH 3 DAYS Performed at [facility], [address] [place], [place] [address]    Report Status PENDING  Incomplete  Resp Panel by RT-PCR (Flu A&B, Covid) Anterior Nasal Swab      Status: None   Collection Time: 04/09/22  7:34 AM   Specimen: Anterior Nasal Swab  Result Value Ref Range Status   SARS Coronavirus 2 by RT PCR NEGATIVE NEGATIVE Final    Comment: (NOTE) SARS-CoV-2 target nucleic acids are NOT DETECTED.  The SARS-CoV-2 RNA is generally detectable in upper respiratory specimens during the acute phase of infection. The lowest concentration of SARS-CoV-2 viral copies this assay can detect is 138 copies/mL. A negative result does not preclude SARS-Cov-2 infection and should not be used as the sole basis for treatment or other patient management decisions. A negative result may occur with  improper specimen collection/handling, submission of specimen other than nasopharyngeal swab, presence of viral mutation(s) within the areas targeted by this assay, and inadequate number of viral copies(<138 copies/mL). A negative result must be combined with clinical observations, patient history, and epidemiological information. The expected result is Negative.  Fact Sheet for Patients:  EntrepreneurPulse.com.au  Fact Sheet for Healthcare Providers:  IncredibleEmployment.be  This test is no t yet approved or cleared by the [place] and  has been authorized  for detection and/or diagnosis of SARS-CoV-2 by FDA under an Emergency Use Authorization (EUA). This EUA will remain  in effect (meaning this test can be used) for the duration of the COVID-19 declaration under Section 564(b)(1) of the Act, 21 U.S.C.section 360bbb-3(b)(1), unless the authorization is terminated  or revoked sooner.       Influenza A by PCR NEGATIVE NEGATIVE Final   Influenza B by PCR NEGATIVE NEGATIVE Final    Comment: (NOTE) The Xpert Xpress SARS-CoV-2/FLU/RSV plus assay is intended as an aid in the diagnosis of influenza from Nasopharyngeal swab specimens and should not be used as a sole basis for treatment. Nasal washings and aspirates are  unacceptable for Xpert Xpress SARS-CoV-2/FLU/RSV testing.  Fact Sheet for Patients: EntrepreneurPulse.com.au  Fact Sheet for Healthcare Providers: IncredibleEmployment.be  This test is not yet approved or cleared by the [place] FDA and has been authorized for detection and/or diagnosis of SARS-CoV-2 by FDA under an Emergency Use Authorization (EUA). This EUA will remain in effect (meaning this test can be used) for the duration of the COVID-19 declaration under Section 564(b)(1) of the Act, 21 U.S.C. section 360bbb-3(b)(1), unless the authorization is terminated or revoked.  Performed at [facility], [address] [place], [place] [address]     Labs: CBC: Recent Labs  Lab 04/09/22 0734 04/10/22 0645 04/11/22 0518 04/12/22 0449  WBC 16.5* 17.6* 8.4 7.9  NEUTROABS 14.1*  --   --   --   HGB 12.1 9.9* 8.9* 9.9*  HCT 38.3 30.7* 28.5* 31.8*  MCV 79.3* 78.9* 81.0 79.3*  PLT 319 259 232 [id number]   Basic Metabolic Panel: Recent Labs  Lab 04/09/22 0734 04/10/22 0645 04/11/22 0518 04/12/22 0449  NA 134* 140 139 138  K 3.8 4.2 4.0 4.0  CL 103 111 112* 106  CO2 24 25 26 27   GLUCOSE 106* 116* 94 92  BUN 11 12 12 10   CREATININE 0.53 0.36* 0.51 0.59  CALCIUM 9.2 8.5* 8.2* 8.4*  MG  --   --  1.9  --   PHOS  --   --  2.9  --    Liver Function Tests: Recent Labs  Lab 04/09/22 0734  AST 18  ALT 12  ALKPHOS 69  BILITOT 0.8  PROT 7.5  ALBUMIN 4.1   CBG: No results for input(s): "GLUCAP" in the last 168 hours.  Discharge time spent: greater than 30 minutes.  Signed: Patrecia Pour, MD [facility] 04/12/2022

## 2022-04-12 NOTE — Discharge Instructions (Signed)
In addition to included general post-operative instructions,  Diet: Resume home diet.   Activity: No heavy lifting >20 pounds (children, pets, laundry, garbage) for 4-6 weeks, but light activity and walking are encouraged. Do not drive or drink alcohol if taking narcotic pain medications or having pain that might distract from driving.  Wound care: You may shower/get incision wet with soapy water and pat dry (do not rub incisions), but no baths or submerging incision underwater until follow-up.   Medications: Resume all home medications. For mild to moderate pain: acetaminophen (Tylenol) or ibuprofen/naproxen (if no kidney disease). Combining Tylenol with alcohol can substantially increase your risk of causing liver disease. Narcotic pain medications, if prescribed, can be used for severe pain, though may cause nausea, constipation, and drowsiness. Do not combine Tylenol and Percocet (or similar) within a 6 hour period as Percocet (and similar) contain(s) Tylenol. If you do not need the narcotic pain medication, you do not need to fill the prescription.  Call office ([phone] / [phone]) at any time if any questions, worsening pain, fevers/chills, bleeding, drainage from incision site, or other concerns.  

## 2022-04-14 LAB — CULTURE, BLOOD (ROUTINE X 2)
Culture: NO GROWTH
Culture: NO GROWTH
Special Requests: ADEQUATE

## 2022-04-18 ENCOUNTER — Encounter: Payer: Self-pay | Admitting: Oncology

## 2022-04-27 ENCOUNTER — Encounter: Payer: Self-pay | Admitting: Physician Assistant

## 2022-04-27 ENCOUNTER — Ambulatory Visit (INDEPENDENT_AMBULATORY_CARE_PROVIDER_SITE_OTHER): Payer: Commercial Managed Care - HMO | Admitting: Physician Assistant

## 2022-04-27 VITALS — BP 117/77 | HR 103 | Temp 97.9°F | Wt 154.2 lb

## 2022-04-27 DIAGNOSIS — K561 Intussusception: Secondary | ICD-10-CM

## 2022-04-27 DIAGNOSIS — Z09 Encounter for follow-up examination after completed treatment for conditions other than malignant neoplasm: Secondary | ICD-10-CM

## 2022-04-27 NOTE — Patient Instructions (Signed)

## 2022-04-27 NOTE — Progress Notes (Signed)
[facility] POST-OP OFFICE VISIT  04/27/2022  HPI: Melissa Coffey is a 26 y.o. female 18 days s/p robotic assisted laparoscopic small bowel resection with intracorporeal anastomosis for small bowel volvulus with Dr Everlene Farrier   She is doing well Pain is down to a 4/10; mostly a soreness after being active for a long tim; Is able to walk a decent amount during the day; using tylenol She reports some blood with the first few BMs but this is now resolved No fever, chills, nausea, emesis.  Incisions are well healed No other complaints   Vital signs: BP 117/77   Pulse (!) 103   Temp 97.9 F (36.6 C) (Oral)   Wt 154 lb 3.2 oz (69.9 kg)   LMP 03/26/2022 (Approximate)   SpO2 98%   BMI 27.32 kg/m    Physical Exam: Constitutional: Well appearing female, NAD Abdomen: Soft, incisional and generalized soreness, non-distended, no rebound/guarding Skin: Laparoscopic incisions are healing well, no erythema or drainage   Assessment/Plan: This is a 26 y.o. female 18 days s/p robotic assisted laparoscopic small bowel resection with intracorporeal anastomosis for small bowel volvulus   - Pain control prn; OTC medications +/- ice pack  - Reviewed wound care recommendation  - Reviewed lifting restrictions; 4 weeks total; work note given  - I will see her again on 07/13 before returning to work; She understands to call with questions/concerns in the interim   -- Lynden Oxford, PA-C [facility] 04/27/2022, 2:19 PM M-F: 7am - 4pm

## 2022-04-28 ENCOUNTER — Telehealth: Payer: Self-pay | Admitting: *Deleted

## 2022-04-28 NOTE — Telephone Encounter (Signed)
Faxed FMLA to [employer] at [phone]  °

## 2022-05-11 ENCOUNTER — Ambulatory Visit (INDEPENDENT_AMBULATORY_CARE_PROVIDER_SITE_OTHER): Payer: Commercial Managed Care - HMO | Admitting: Physician Assistant

## 2022-05-11 ENCOUNTER — Encounter: Payer: Self-pay | Admitting: Physician Assistant

## 2022-05-11 VITALS — BP 118/79 | HR 114 | Temp 98.0°F | Wt 157.0 lb

## 2022-05-11 DIAGNOSIS — Z09 Encounter for follow-up examination after completed treatment for conditions other than malignant neoplasm: Secondary | ICD-10-CM

## 2022-05-11 DIAGNOSIS — K562 Volvulus: Secondary | ICD-10-CM

## 2022-05-11 DIAGNOSIS — K561 Intussusception: Secondary | ICD-10-CM

## 2022-05-11 NOTE — Progress Notes (Signed)
[facility] POST-OP OFFICE VISIT  05/11/2022  HPI: KEIONDRA BROOKOVER is a 26 y.o. female 32 days s/p robotic assisted laparoscopic small bowel resection with intracorporeal anastomosis for small bowel volvulus with Dr Everlene Farrier   She has done well Now pain free No fever, chills, nausea, emesis Tolerating PO; normal bowel function No other complaints   Vital signs: BP 118/79   Pulse (!) 114   Temp 98 F (36.7 C) (Oral)   Wt 157 lb (71.2 kg)   LMP 03/26/2022 (Approximate)   SpO2 99%   BMI 27.81 kg/m    Physical Exam: Constitutional: Well appearing female, NAD Abdomen: Soft, non-tender, non-distended, no rebound/guarding Skin: Laparoscopic incisions are well healed, no erythema or drainage   Assessment/Plan: This is a 26 y.o. female 32 days s/p robotic assisted laparoscopic small bowel resection with intracorporeal anastomosis for small bowel volvulus    - Pain control prn  - Reviewed wound care recommendation  - Reviewed lifting restrictions; she has completed these. Cleared to return to work; note given  - She can follow up on as needed basis; She understands to call with questions/concerns  -- Lynden Oxford, PA-C [facility] 05/11/2022, 3:02 PM M-F: 7am - 4pm

## 2022-05-11 NOTE — Patient Instructions (Signed)
If you have any concerns or questions, please feel free to call our office.   Minimally Invasive Small Bowel Resection, Care After This sheet gives you information about how to care for yourself after your procedure. Your health care provider may also give you more specific instructions. If you have problems or questions, contact your health care provider. What can I expect after the procedure? After the procedure, it is common to have: Pain in your abdomen, especially in the incision areas. You will be given pain medicines to control this. Tiredness. This is a normal part of the recovery process. Your energy level will return to normal over the next several weeks. Constipation. You may be given a stool softener to help prevent this. Follow these instructions at home: Medicines Take over-the-counter and prescription medicines only as told by your health care provider. Ask your health care provider if the medicine prescribed to you requires you to avoid driving or using heavy machinery. If you were prescribed an antibiotic medicine, use it as told by your health care provider. Do not stop using the antibiotic even if you start to feel better. Activity Return to your normal activities as told by your health care provider. Ask your health care provider what activities are safe for you. Do not lift anything that is heavier than 10 lb (4.5 kg), or the limit that you are told, for 4 weeks or until your health care provider says that it is safe. Rest as told by your health care provider. Avoid sitting for a long time without moving. Get up to take short walks every 1-2 hours. This is important to improve blood flow and breathing. Ask for help if you feel weak or unsteady. Avoid activities that take a lot of effort, such as running or aerobics, for at least 6 weeks or until your health care provider approves. Do not drive until your health care provider approves. Incision care  Follow instructions  from your health care provider about how to take care of your incision areas. Make sure you: Keep your incisions clean and dry. Wash your hands with soap and water before and after you change your bandage (dressing). If soap and water are not available, use hand sanitizer. Change your dressing as told by your health care provider. Leave stitches (sutures), skin glue, or adhesive strips in place. These skin closures may need to stay in place for 2 weeks or longer. If adhesive strip edges start to loosen and curl up, you may trim the loose edges. Do not remove adhesive strips completely unless your health care provider tells you to do that. Check your incision areas every day for signs of infection. Check for: Redness, swelling, or pain. Fluid or blood. Warmth. Pus or a bad smell. Do not take baths, swim, or use a hot tub until your health care provider approves. Ask your health care provider if you may take showers. You may only be allowed to take sponge baths. Managing constipation You may need to take these actions to prevent or treat constipation: Drink enough fluid to keep your urine pale yellow. Take over-the-counter or prescription medicines. Eat foods that are high in fiber, such as beans, whole grains, and fresh fruits and vegetables. Limit foods that are high in fat and processed sugars, such as fried or sweet foods.  General instructions You may eat what you usually do. Wear compression stockings as told by your health care provider. These stockings help to prevent blood clots and reduce swelling in  your legs. Do not use any products that contain nicotine or tobacco, such as cigarettes, e-cigarettes, and chewing tobacco. These can delay incision healing after surgery. If you need help quitting, ask your health care provider. Keep all follow-up visits as told by your health care provider. This is important. Contact a health care provider if: You have pain that is not relieved with  medicine. You do not feel like eating. You feel nauseous or you vomit. You have constipation that is not relieved with prescribed stool softeners. You have a fever or chills. You have any of these signs of infection: Redness, swelling, or pain in the incision areas. Fluid or blood coming from your incisions. Warmth coming from your incisions. Pus or a bad smell coming from the incision areas. Your incisions break open. You have bleeding from the rectum. You have swelling of your abdomen. Get help right away if: Your pain gets worse, even after you take pain medicine. Your legs or arms become painful, red, or swollen. You have chest pain. You have trouble breathing. You have persistent nausea or vomiting. You have increased abdominal pain. You cannot have a bowel movement or pass gas. These symptoms may represent a serious problem that is an emergency. Do not wait to see if the symptoms will go away. Get medical help right away. Call your local emergency services (911 in the [place] Do not drive yourself to the hospital. Summary After the procedure, it is common to have pain in your abdomen and incision areas, tiredness, and constipation. Take over-the-counter and prescription medicines only as told by your health care provider. Return to your normal activities as told by your health care provider. Ask your health care provider what activities are safe for you. Follow instructions from your health care provider about how to take care of your incision areas. Contact a health care provider if you have pain that is not relieved with medicine or if you have any signs of infection. This information is not intended to replace advice given to you by your health care provider. Make sure you discuss any questions you have with your health care provider. Document Revised: 03/20/2019 Document Reviewed: 03/20/2019 Elsevier Patient Education  2023 [employer].

## 2022-06-16 ENCOUNTER — Encounter: Payer: Self-pay | Admitting: Oncology

## 2022-11-03 ENCOUNTER — Encounter: Payer: Self-pay | Admitting: Oncology

## 2022-11-03 DIAGNOSIS — R000 Tachycardia, unspecified: Secondary | ICD-10-CM | POA: Diagnosis not present

## 2022-11-04 ENCOUNTER — Ambulatory Visit
Admission: RE | Admit: 2022-11-04 | Discharge: 2022-11-04 | Disposition: A | Discharge status: Home / Self Care | Payer: Medicaid Other | Source: Ambulatory Visit | Attending: Family Medicine | Admitting: Family Medicine

## 2022-11-04 VITALS — BP 130/99 | HR 113 | Temp 98.3°F | Resp 14 | Ht 63.0 in | Wt 150.0 lb

## 2022-11-04 DIAGNOSIS — K047 Periapical abscess without sinus: Secondary | ICD-10-CM

## 2022-11-04 MED ORDER — AMOXICILLIN-POT CLAVULANATE 875-125 MG PO TABS: 1.0000 | ORAL_TABLET | Freq: Two times a day (BID) | ORAL | 0 refills | Status: DC

## 2022-11-04 MED ORDER — MELOXICAM 15 MG PO TABS: 15.0000 mg | ORAL_TABLET | Freq: Every day | ORAL | 0 refills | Status: DC | PRN

## 2022-11-04 NOTE — Discharge Instructions (Signed)
If you worsen, go directly to the ER.  Take care  Dr. Briunna Leicht  

## 2022-11-04 NOTE — ED Provider Notes (Signed)
[facility]    CSN: [id number] Arrival date & time: 11/04/22  0903      History   Chief Complaint Chief Complaint  Patient presents with   Facial Pain    Appointment    HPI  27 year old female presents for evaluation the above.  Patient reports that today is day 3 of symptoms.  She reports left-sided facial swelling.  He is worsening.  She initially had some dental discomfort but this has resolved.  She has not seen a dentist in quite some time.  No fever.  No difficulties eating or swallowing.  She has been icing the area with some improvement but no resolution.  No other associated symptoms.  No other complaints.  Past Medical History:  Diagnosis Date   Anemia    Depression    Endometriosis    History of blood transfusion 2017   after placental abruption   Pyelonephritis 05/2018   Sepsis (HCC) 05/2018   associated with pyelo   UTI (urinary tract infection)     Patient Active Problem List   Diagnosis Date Noted   Nicotine dependence 04/09/2022   History of cesarean delivery 08/19/2019   Encounter for sterilization 08/19/2019   Supervision of other normal pregnancy, antepartum 01/03/2019   Sepsis (HCC) 06/22/2018   Pyelonephritis 05/2018   Intussusception intestine (HCC) 05/2018   Compulsive skin picking 11/30/2017   Anxiety and depression 11/29/2017   Previous cesarean delivery, antepartum condition or complication 05/09/2016   Status post cesarean delivery 05/09/2016   Anemia in pregnancy 11/11/2015    Past Surgical History:  Procedure Laterality Date   ABDOMINAL SURGERY     edemetriosis  Exploratory lap   CESAREAN SECTION  02/2014   pLTCS. FITL at term   CESAREAN SECTION N/A 05/09/2016   Procedure: CESAREAN SECTION;  Surgeon: Conard Novak, MD;  Location: [facility];  Service: Obstetrics;  Laterality: N/A;   CESAREAN SECTION WITH BILATERAL TUBAL LIGATION N/A 08/19/2019   Procedure: CESAREAN SECTION WITH BILATERAL TUBAL LIGATION;  Surgeon:  Conard Novak, MD;  Location: [facility];  Service: Obstetrics;  Laterality: N/A;   NECK SURGERY  2003   TONSILLECTOMY     XI ROBOTIC ASSISTED SMALL BOWEL RESECTION N/A 04/09/2022   Procedure: XI ROBOTIC ASSISTED SMALL BOWEL RESECTION;  Surgeon: Leafy Ro, MD;  Location: [facility];  Service: General;  Laterality: N/A;    OB History     Gravida  4   Para  3   Term  2   Preterm  1   AB  1   Living  3      SAB  1   IAB      Ectopic      Multiple  0   Live Births  3        Obstetric Comments  02/2014: pLTCS for FITL          Home Medications    Prior to Admission medications   Medication Sig Start Date End Date Taking? Authorizing Provider  amoxicillin-clavulanate (AUGMENTIN) 875-125 MG tablet Take 1 tablet by mouth every 12 (twelve) hours. 11/04/22  Yes Glenden Rossell G, DO  meloxicam (MOBIC) 15 MG tablet Take 1 tablet (15 mg total) by mouth daily as needed for pain. And swelling. 11/04/22  Yes Tommie Sams, DO    Family History Family History  Problem Relation Age of Onset   Diabetes Maternal Grandmother    Hypertension Maternal Grandmother    Cancer Maternal Grandmother  Breast cancer Maternal Grandmother    Diabetes Maternal Uncle    Healthy Mother    Healthy Brother    Healthy Daughter    Healthy Son    Healthy Brother    Stroke Neg Hx    Heart attack Neg Hx    Colon cancer Neg Hx    Ovarian cancer Neg Hx     Social History Social History   Tobacco Use   Smoking status: Former    Packs/day: 1.00    Years: 0.50    Total pack years: 0.50    Types: Cigarettes    Quit date: 12/03/2018    Years since quitting: 3.9   Smokeless tobacco: Never   Tobacco comments:    quit w/+pregnancy test  Vaping Use   Vaping Use: Never used  Substance Use Topics   Alcohol use: No   Drug use: Not Currently     Allergies   Patient has no known allergies.   Review of Systems Review of Systems Per HPI  Physical Exam Triage Vital Signs ED  Triage Vitals  Enc Vitals Group     BP 11/04/22 0913 (!) 130/99     Pulse Rate 11/04/22 0913 (!) 113     Resp 11/04/22 0913 14     Temp 11/04/22 0913 98.3 F (36.8 C)     Temp Source 11/04/22 0913 Oral     SpO2 11/04/22 0913 100 %     Weight 11/04/22 0912 150 lb (68 kg)     Height 11/04/22 0912 5\' 3"  (1.6 m)     Head Circumference --      Peak Flow --      Pain Score 11/04/22 0912 6     Pain Loc --      Pain Edu? --      Excl. in [place]? --     Updated Vital Signs BP (!) 130/99 (BP Location: Left Arm)   Pulse (!) 113   Temp 98.3 F (36.8 C) (Oral)   Resp 14   Ht 5\' 3"  (1.6 m)   Wt 68 kg   LMP 10/14/2022 (Approximate)   SpO2 100%   Breastfeeding No   BMI 26.57 kg/m   Visual Acuity Right Eye Distance:   Left Eye Distance:   Bilateral Distance:    Right Eye Near:   Left Eye Near:    Bilateral Near:     Physical Exam Vitals and nursing note reviewed.  Constitutional:      General: She is not in acute distress. HENT:     Head: Normocephalic and atraumatic.     Mouth/Throat:      Comments: Decayed tooth (at labelled location).  Eyes:     General:        Right eye: No discharge.        Left eye: No discharge.     Conjunctiva/sclera: Conjunctivae normal.  Cardiovascular:     Rate and Rhythm: Regular rhythm. Tachycardia present.  Pulmonary:     Effort: Pulmonary effort is normal.     Breath sounds: Normal breath sounds. No wheezing or rales.  Neurological:     Mental Status: She is alert.  Psychiatric:        Mood and Affect: Mood normal.        Behavior: Behavior normal.    UC Treatments / Results  Labs (all labs ordered are listed, but only abnormal results are displayed) Labs Reviewed - No data to display  EKG   Radiology No  results found.  Procedures Procedures (including critical care time)  Medications Ordered in UC Medications - No data to display  Initial Impression / Assessment and Plan / UC Course  I have reviewed the triage vital  signs and the nursing notes.  Pertinent labs & imaging results that were available during my care of the patient were reviewed by me and considered in my medical decision making (see chart for details).    27 year old female presents with left-sided facial swelling.  I suspect that this is dental abscess based on history and exam.  Augmentin as prescribed.  Meloxicam as directed as well.  Advised that if she worsens in any way she needs to go directly to the hospital.  Advised to see dentist to soon as possible.  Final Clinical Impressions(s) / UC Diagnoses   Final diagnoses:  Dental abscess     Discharge Instructions      If you worsen, go directly to the ER.  Take care  Dr. Lacinda Axon    ED Prescriptions     Medication Sig Dispense Auth. Provider   amoxicillin-clavulanate (AUGMENTIN) 875-125 MG tablet Take 1 tablet by mouth every 12 (twelve) hours. 20 tablet Dailin Sosnowski G, DO   meloxicam (MOBIC) 15 MG tablet Take 1 tablet (15 mg total) by mouth daily as needed for pain. And swelling. 30 tablet Coral Spikes, DO      PDMP not reviewed this encounter.   Thersa [place], [place] 11/04/22 [phone]

## 2022-11-04 NOTE — ED Triage Notes (Signed)
Patient c/o swelling and pain on the left side of her face that started 3 days ago.  Patient states that she previously was having left sided tooth pain.  Patient denies any injury or fall. Patient denies fevers.

## 2023-04-09 IMAGING — CT CT ABD-PELV W/ CM
2 of 4 series · 15 of 46 positions shown, 17 images · IV contrast (APPLIED)
Comparison: 06/22/2018

CLINICAL DATA: Abdominal pain.

EXAM:
CT ABDOMEN AND PELVIS WITH CONTRAST
TECHNIQUE: Multidetector CT imaging of the abdomen and pelvis was performed
using the standard protocol following bolus administration of
intravenous contrast.

[Series 3: abdomen 5.0 · axial · 0.73mm/px · z∈[+986,+1401]mm · 12 of 95 slices shown, 14 images]
[im 6/95  soft-tissue]
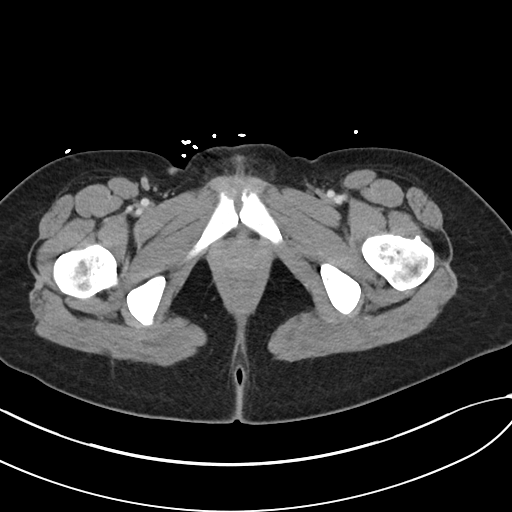
[im 6/95  bone]
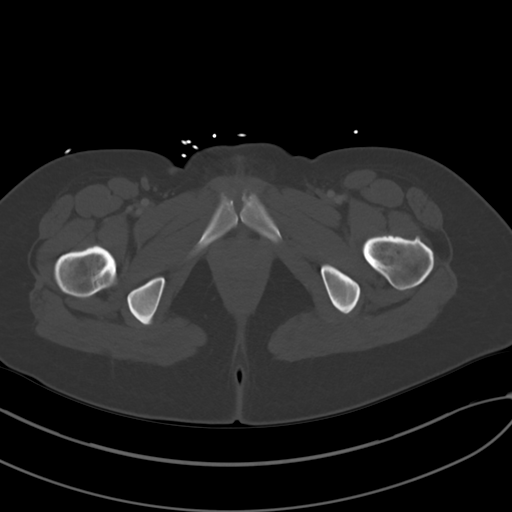
[im 16/95  soft-tissue]
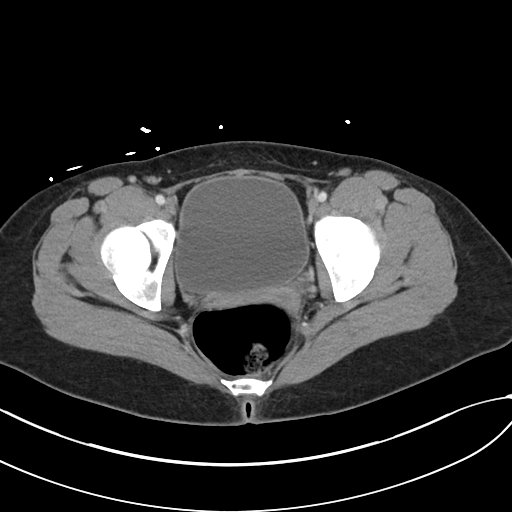
[im 21/95  soft-tissue]
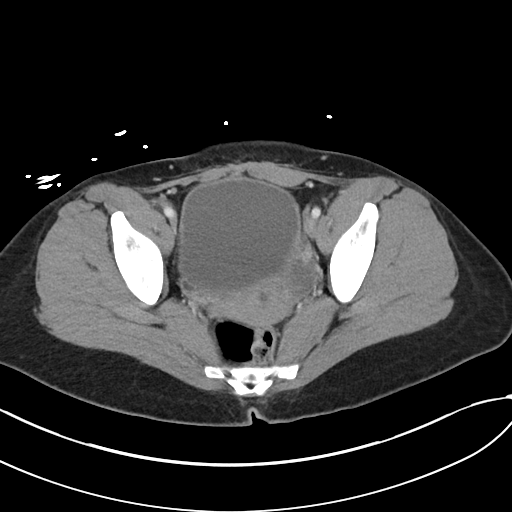
[im 27/95  soft-tissue]
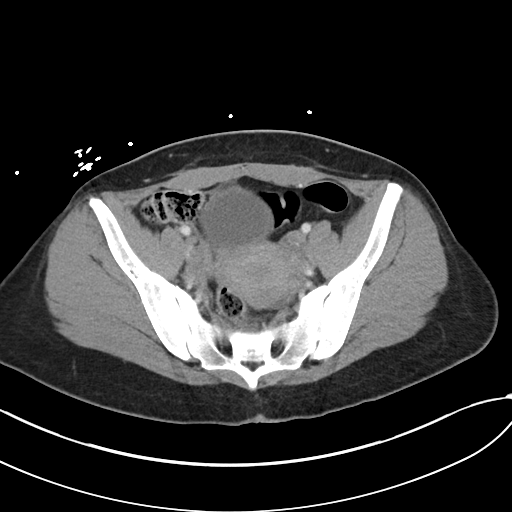
[im 37/95  soft-tissue]
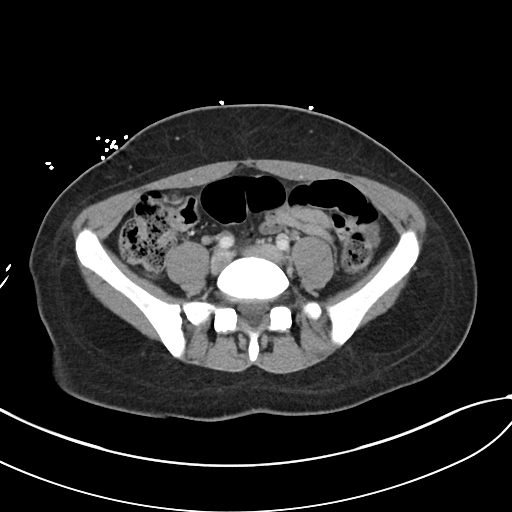
[im 42/95  soft-tissue]
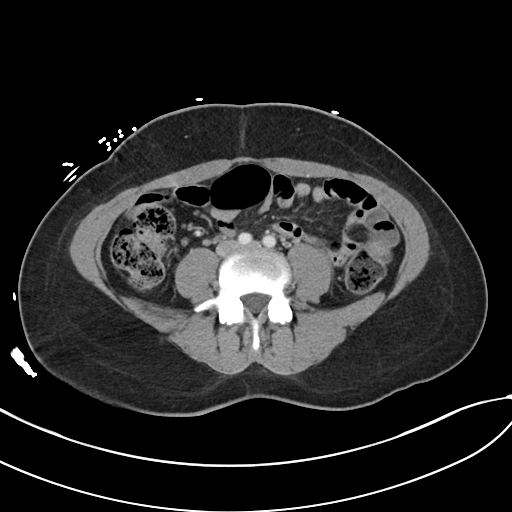
[im 53/95  soft-tissue]
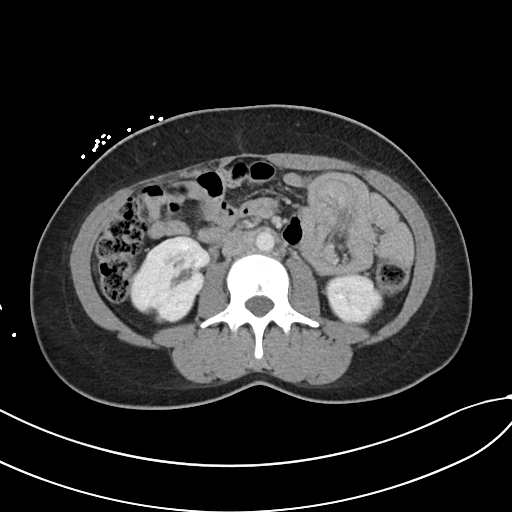
[im 58/95  soft-tissue]
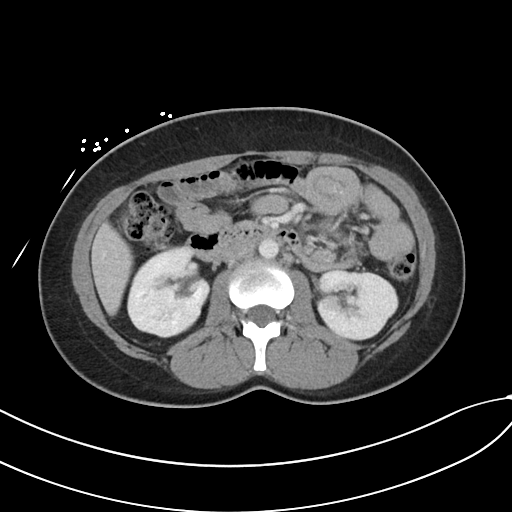
[im 68/95  soft-tissue]
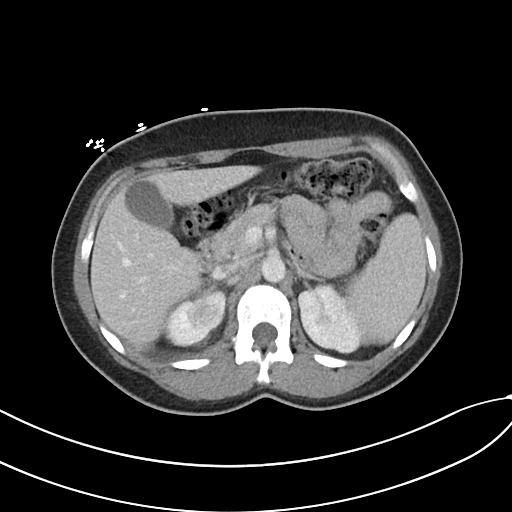
[im 68/95  bone]
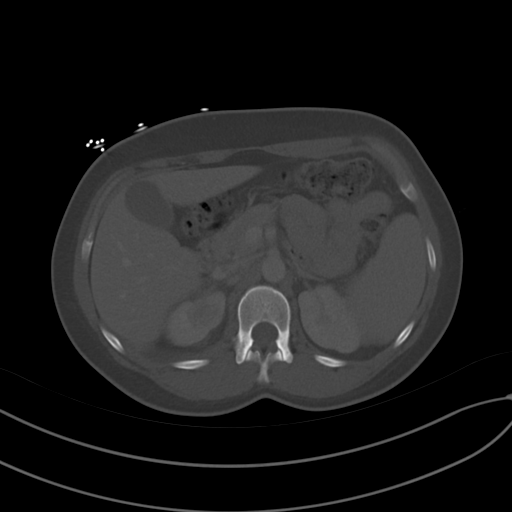
[im 74/95  soft-tissue]
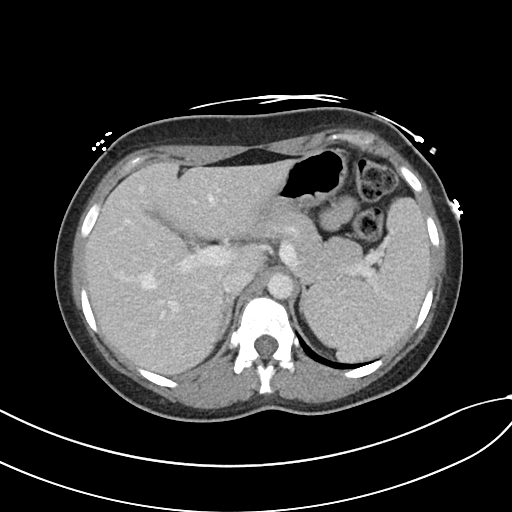
[im 79/95  soft-tissue]
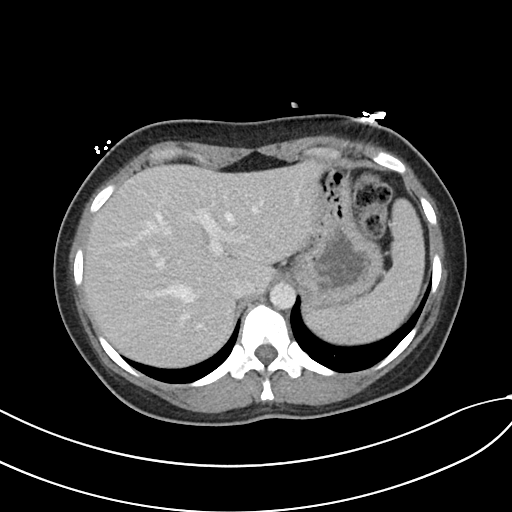
[im 89/95  soft-tissue]
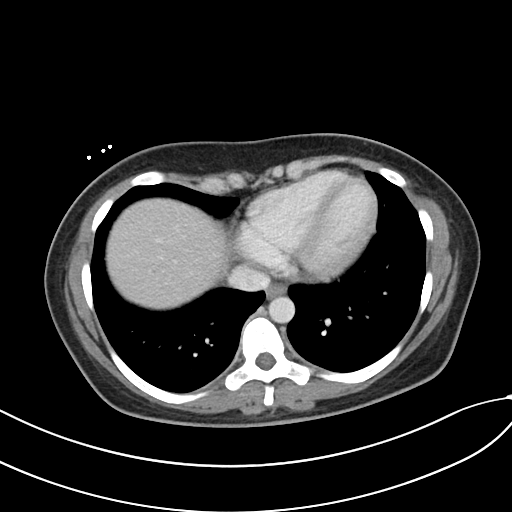

[Series 6: abdomen 3.0 mpr cor · coronal · 0.72mm/px · 3 of 78 slices shown]
[im 26/78  soft-tissue]
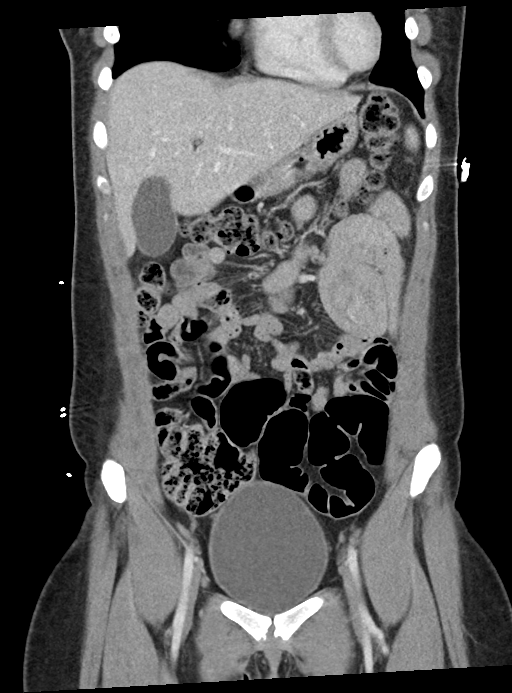
[im 35/78  soft-tissue]
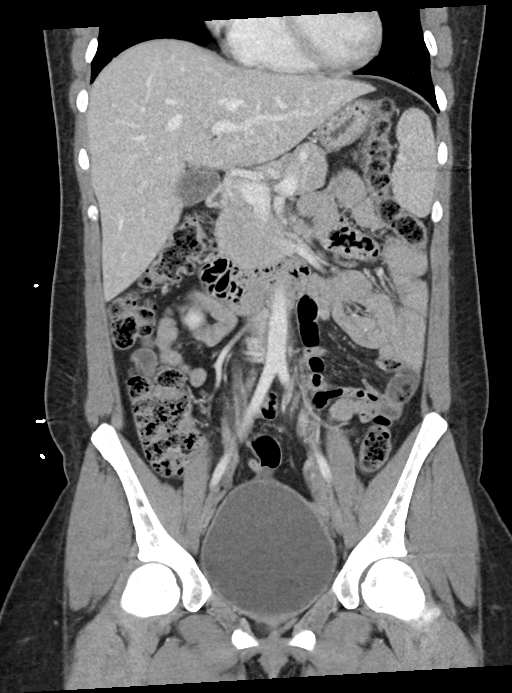
[im 43/78  soft-tissue]
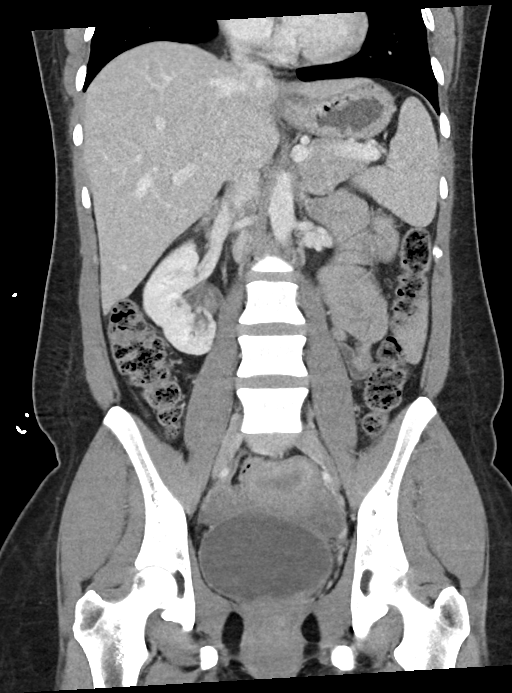

[15 of 46 positions shown; findings below may reference images not displayed]

RADIATION DOSE REDUCTION: This exam was performed according to the
departmental dose-optimization program which includes automated
exposure control, adjustment of the mA and/or kV according to
patient size and/or use of iterative reconstruction technique.

CONTRAST:  100mL OMNIPAQUE IOHEXOL 300 MG/ML  SOLN
FINDINGS: Lower chest: Unremarkable.

Hepatobiliary: No suspicious focal abnormality within the liver
parenchyma. There is no evidence for gallstones, gallbladder wall
thickening, or pericholecystic fluid. No intrahepatic or
extrahepatic biliary dilation.

Pancreas: No focal mass lesion. No dilatation of the main duct. No
intraparenchymal cyst. No peripancreatic edema.

Spleen: No splenomegaly. No focal mass lesion.

Adrenals/Urinary Tract: No adrenal nodule or mass. Areas of cortical
scarring noted right kidney. Segmental decreased perfusion noted
upper pole right kidney with subtle perinephric edema/inflammation
associated (axial [DATE] and coronal 61/6). Left kidney unremarkable.
No hydroureter. The urinary bladder appears normal for the degree of
distention.

Stomach/Bowel: Stomach is unremarkable. No gastric wall thickening.
No evidence of outlet obstruction. Duodenum is normally positioned
as is the ligament of Treitz. Relatively long segment of jejunal
intussusception identified in the left abdomen, measuring on the
order of 15-20 cm in length. There is some mild associated wall
thickening but no perienteric edema and no proximal dilatation to
suggest obstruction. The terminal ileum is normal. The appendix is
normal. No gross colonic mass. No colonic wall thickening.

Vascular/Lymphatic: No abdominal aortic aneurysm. No abdominal
aortic atherosclerotic calcification. There is no gastrohepatic or
hepatoduodenal ligament lymphadenopathy. No retroperitoneal or
mesenteric lymphadenopathy. No pelvic sidewall lymphadenopathy.

Reproductive: The uterus is unremarkable.  There is no adnexal mass.

Other: No intraperitoneal free fluid.

Musculoskeletal: No worrisome lytic or sclerotic osseous
abnormality.
IMPRESSION: 1. Segmental decreased perfusion in the upper pole right kidney with
subtle perinephric edema/inflammation. Imaging features are highly
suggestive of pyelonephritis.
2. Relatively long segment of jejunal intussusception in the left
abdomen measuring on the order of 15-20 cm in length. There is some
mild associated wall thickening but no perienteric edema and no
proximal dilatation to suggest obstruction. No small bowel mass
lesion or mesenteric lymphadenopathy to indicate a lead point. The
length of the intussusception is longer than typically seen in the
setting of transient peristalsis related intussusception. While this
may be benign and transient, given the degree of bowel involvement,
close follow-up recommended.
3. Otherwise unremarkable exam.

## 2023-07-10 ENCOUNTER — Encounter: Payer: Self-pay | Admitting: Oncology

## 2023-07-10 ENCOUNTER — Ambulatory Visit
Admission: RE | Admit: 2023-07-10 | Discharge: 2023-07-10 | Disposition: A | Discharge status: Home / Self Care | Payer: Self-pay | Source: Ambulatory Visit | Attending: Physician Assistant | Admitting: Physician Assistant

## 2023-07-10 ENCOUNTER — Other Ambulatory Visit: Payer: Self-pay | Admitting: Physician Assistant

## 2023-07-10 DIAGNOSIS — W000XXA Fall on same level due to ice and snow, initial encounter: Secondary | ICD-10-CM | POA: Diagnosis not present

## 2023-07-10 DIAGNOSIS — Y9259 Other trade areas as the place of occurrence of the external cause: Secondary | ICD-10-CM | POA: Insufficient documentation

## 2023-07-10 DIAGNOSIS — M545 Low back pain, unspecified: Secondary | ICD-10-CM | POA: Insufficient documentation

## 2023-07-10 DIAGNOSIS — Y990 Civilian activity done for income or pay: Secondary | ICD-10-CM

## 2023-07-10 DIAGNOSIS — S3992XA Unspecified injury of lower back, initial encounter: Secondary | ICD-10-CM | POA: Insufficient documentation

## 2023-07-13 ENCOUNTER — Encounter: Payer: Self-pay | Admitting: Oncology

## 2023-11-20 ENCOUNTER — Other Ambulatory Visit: Payer: Self-pay

## 2023-11-20 ENCOUNTER — Emergency Department: Payer: Self-pay

## 2023-11-20 ENCOUNTER — Emergency Department
Admission: EM | Admit: 2023-11-20 | Discharge: 2023-11-20 | Disposition: A | Discharge status: Home / Self Care | Payer: Self-pay | Attending: Emergency Medicine | Admitting: Emergency Medicine

## 2023-11-20 ENCOUNTER — Encounter: Payer: Self-pay | Admitting: Intensive Care

## 2023-11-20 DIAGNOSIS — R1032 Left lower quadrant pain: Secondary | ICD-10-CM | POA: Insufficient documentation

## 2023-11-20 DIAGNOSIS — M545 Low back pain, unspecified: Secondary | ICD-10-CM | POA: Insufficient documentation

## 2023-11-20 DIAGNOSIS — R1031 Right lower quadrant pain: Secondary | ICD-10-CM | POA: Insufficient documentation

## 2023-11-20 DIAGNOSIS — Z20822 Contact with and (suspected) exposure to covid-19: Secondary | ICD-10-CM | POA: Insufficient documentation

## 2023-11-20 DIAGNOSIS — R112 Nausea with vomiting, unspecified: Secondary | ICD-10-CM | POA: Insufficient documentation

## 2023-11-20 LAB — COMPREHENSIVE METABOLIC PANEL
ALT: 14 U/L (ref 0–44)
AST: 14 U/L — ABNORMAL LOW (ref 15–41)
Albumin: 4 g/dL (ref 3.5–5.0)
Alkaline Phosphatase: 75 U/L (ref 38–126)
Anion gap: 9 (ref 5–15)
BUN: 16 mg/dL (ref 6–20)
CO2: 24 mmol/L (ref 22–32)
Calcium: 8.8 mg/dL — ABNORMAL LOW (ref 8.9–10.3)
Chloride: 102 mmol/L (ref 98–111)
Creatinine, Ser: 0.69 mg/dL (ref 0.44–1.00)
GFR, Estimated: >60 mL/min (ref >60)
Glucose, Bld: 100 mg/dL — ABNORMAL HIGH (ref 70–99)
Potassium: 3.8 mmol/L (ref 3.5–5.1)
Sodium: 135 mmol/L (ref 135–145)
Total Bilirubin: 1.4 mg/dL — ABNORMAL HIGH (ref 0.0–1.2)
Total Protein: 7.7 g/dL (ref 6.5–8.1)

## 2023-11-20 LAB — URINALYSIS, ROUTINE W REFLEX MICROSCOPIC
Bilirubin Urine: NEGATIVE
Glucose, UA: NEGATIVE mg/dL
Ketones, ur: NEGATIVE mg/dL
Nitrite: NEGATIVE
Protein, ur: NEGATIVE mg/dL
Specific Gravity, Urine: 1.024 (ref 1.005–1.030)
pH: 5 (ref 5.0–8.0)

## 2023-11-20 LAB — CBC
HCT: 41.8 % (ref 36.0–46.0)
Hemoglobin: 14.7 g/dL (ref 12.0–15.0)
MCH: 30.9 pg (ref 26.0–34.0)
MCHC: 35.2 g/dL (ref 30.0–36.0)
MCV: 88 fL (ref 80.0–100.0)
Platelets: 344 10*3/uL (ref 150–400)
RBC: 4.75 MIL/uL (ref 3.87–5.11)
RDW: 12.3 % (ref 11.5–15.5)
WBC: 7.5 10*3/uL (ref 4.0–10.5)
nRBC: 0 % (ref 0.0–0.2)

## 2023-11-20 LAB — RESP PANEL BY RT-PCR (RSV, FLU A&B, COVID)  RVPGX2
Influenza A by PCR: NEGATIVE
Influenza B by PCR: NEGATIVE
Resp Syncytial Virus by PCR: NEGATIVE
SARS Coronavirus 2 by RT PCR: NEGATIVE

## 2023-11-20 LAB — POC URINE PREG, ED: Preg Test, Ur: NEGATIVE

## 2023-11-20 LAB — LIPASE, BLOOD: Lipase: 27 U/L (ref 11–51)

## 2023-11-20 MED ORDER — ONDANSETRON 4 MG PO TBDP: 4.0000 mg | ORAL_TABLET | Freq: Three times a day (TID) | ORAL | 0 refills | Status: DC | PRN

## 2023-11-20 MED ORDER — IOHEXOL 300 MG/ML  SOLN
100.0000 mL | Freq: Once | INTRAMUSCULAR | Status: AC | PRN
Start: 2023-11-20 — End: 2023-11-20
  Administered 2023-11-20: 100 mL via INTRAVENOUS

## 2023-11-20 MED ORDER — SODIUM CHLORIDE 0.9 % IV BOLUS
1000.0000 mL | Freq: Once | INTRAVENOUS | Status: AC
  Administered 2023-11-20: 1000 mL via INTRAVENOUS

## 2023-11-20 MED ORDER — MORPHINE SULFATE (PF) 4 MG/ML IV SOLN
4.0000 mg | Freq: Once | INTRAVENOUS | Status: AC
  Administered 2023-11-20: 4 mg via INTRAVENOUS
  Filled 2023-11-20: qty 1

## 2023-11-20 MED ORDER — KETOROLAC TROMETHAMINE 15 MG/ML IJ SOLN
15.0000 mg | Freq: Once | INTRAMUSCULAR | Status: AC
  Administered 2023-11-20: 15 mg via INTRAVENOUS
  Filled 2023-11-20: qty 1

## 2023-11-20 MED ORDER — ONDANSETRON HCL 4 MG/2ML IJ SOLN
4.0000 mg | Freq: Once | INTRAMUSCULAR | Status: AC
Start: 2023-11-20 — End: 2023-11-20
  Administered 2023-11-20: 4 mg via INTRAVENOUS
  Filled 2023-11-20: qty 2

## 2023-11-20 NOTE — ED Triage Notes (Signed)
Patient c/o emesis and chills all night and pelvic pain.   Denies urinary symptoms.  Denies fevers

## 2023-11-20 NOTE — ED Provider Notes (Signed)
  Physical Exam  BP (!) 116/90 (BP Location: Left Arm)   Pulse 100   Temp 98.4 F (36.9 C) (Oral)   Resp 18   Ht 5\' 3"  (1.6 m)   Wt 81.6 kg   LMP 11/16/2023 (Within Days)   SpO2 100%   BMI 31.89 kg/m   Physical Exam  Procedures  Procedures  ED Course / MDM    Medical Decision Making Amount and/or Complexity of Data Reviewed Labs: ordered. Radiology: ordered.  Risk Prescription drug management.   Received patient in signout.  28 year old female present today with nausea, vomiting, and lower abdominal pain.  Laboratory workup initially reassuring and patient was symptomatically treated with morphine, Toradol, and Zofran.  Along with fluids.  CT abdomen/pelvis was ordered with plan for follow-up afterwards.  If negative may need transvaginal ultrasound for further evaluation.  CT abdomen/pelvis negative for acute pathology.  Follow-up transvaginal ultrasound was ordered which was also unremarkable.  Suspect this is all more enteritis or viral related.  Patient was feeling better and safe for discharge at this time with symptomatic treatment at home.  Told to follow-up with PCP and given strict return precautions.     Janith Lima, MD 11/20/23 [phone]

## 2023-11-20 NOTE — ED Provider Notes (Signed)
[facility] Provider Note    Event Date/Time   First MD Initiated Contact with Patient 11/20/23 1420     (approximate)   History   Emesis and Pelvic Pain   HPI  Melissa Coffey is a 28 y.o. female no significant past medical history who presents to the emergency department with nausea, vomiting and abdominal pain.  States that she started having abdominal pain last night and then multiple episodes of nausea and vomiting.  Hurting to her lower back.  States that she has a history of kidney infections in the past but this feels slightly different.  Denies any blood in her urine.  Denies any urinary symptoms of urinary urgency or frequency.  Denies any concern for pregnancy.  Denies any concern for an STI.  Prior bowel obstruction in the past that required surgery.  History of C-section.  Does still have her gallbladder and appendix.  Denies any significant alcohol use.  No known sick contacts.     Physical Exam   Triage Vital Signs: ED Triage Vitals [11/20/23 1242]  Encounter Vitals Group     BP (!) 116/90     Systolic BP Percentile      Diastolic BP Percentile      Pulse Rate 100     Resp 18     Temp 98.4 F (36.9 C)     Temp Source Oral     SpO2 100 %     Weight 180 lb (81.6 kg)     Height 5\' 3"  (1.6 m)     Head Circumference      Peak Flow      Pain Score 8     Pain Loc      Pain Education      Exclude from Growth Chart     Most recent vital signs: Vitals:   11/20/23 1242  BP: (!) 116/90  Pulse: 100  Resp: 18  Temp: 98.4 F (36.9 C)  SpO2: 100%    Physical Exam Constitutional:      Appearance: She is well-developed.  HENT:     Head: Atraumatic.  Eyes:     Conjunctiva/sclera: Conjunctivae normal.  Cardiovascular:     Rate and Rhythm: Regular rhythm.  Pulmonary:     Effort: No respiratory distress.  Abdominal:     General: There is no distension.     Tenderness: There is abdominal tenderness (Right lower quadrant abdominal  tenderness to palpation.  Mild left lower quadrant abdominal tenderness to).  Musculoskeletal:        General: Normal range of motion.     Cervical back: Normal range of motion.  Skin:    General: Skin is warm.     Capillary Refill: Capillary refill takes less than 2 seconds.  Neurological:     Mental Status: She is alert. Mental status is at baseline.  Psychiatric:        Mood and Affect: Mood normal.      IMPRESSION / MDM / ASSESSMENT AND PLAN / ED COURSE  I reviewed the triage vital signs and the nursing notes.  Differential diagnosis including viral illness including COVID/influenza, gastritis/PUD, appendicitis, ovarian cyst, bowel obstruction   RADIOLOGY  CT scan abdomen and pelvis with contrast ordered   Labs (all labs ordered are listed, but only abnormal results are displayed) Labs interpreted as -    Labs Reviewed  COMPREHENSIVE METABOLIC PANEL - Abnormal; Notable for the following components:      Result Value  Glucose, Bld 100 (*)    Calcium 8.8 (*)    AST 14 (*)    Total Bilirubin 1.4 (*)    All other components within normal limits  URINALYSIS, ROUTINE W REFLEX MICROSCOPIC - Abnormal; Notable for the following components:   Color, Urine YELLOW (*)    APPearance HAZY (*)    Hgb urine dipstick SMALL (*)    Leukocytes,Ua TRACE (*)    Bacteria, UA RARE (*)    All other components within normal limits  RESP PANEL BY RT-PCR (RSV, FLU A&B, COVID)  RVPGX2  LIPASE, BLOOD  CBC  POC URINE PREG, ED    No significant leukocytosis.  Creatinine at baseline.  No significant electrolyte abnormality.  UA with no signs of urinary tract infection.  Pregnancy test is negative.  COVID and influenza testing are pending.  Normal lipase.  Given IV fluids, antiemetics and Toradol.  Plan for CT scan of the abdomen pelvis to further evaluate for appendicitis or bowel obstruction.  Care transferred to incoming provider with CT scan pending.     PROCEDURES:  Critical Care  performed: No  Procedures  Patient's presentation is most consistent with acute presentation with potential threat to life or bodily function.   MEDICATIONS ORDERED IN ED: Medications  ondansetron (ZOFRAN) injection 4 mg (has no administration in time range)  ketorolac (TORADOL) 15 MG/ML injection 15 mg (has no administration in time range)  sodium chloride 0.9 % bolus 1,000 mL (has no administration in time range)    FINAL CLINICAL IMPRESSION(S) / ED DIAGNOSES   Final diagnoses:  Nausea and vomiting, unspecified vomiting type  Right lower quadrant abdominal pain     Rx / DC Orders   ED Discharge Orders          Ordered    ondansetron (ZOFRAN-ODT) 4 MG disintegrating tablet  Every 8 hours PRN        11/20/23 1452             Note:  This document was prepared using Dragon voice recognition software and may include unintentional dictation errors.   Corena Herter, MD 11/20/23 [phone]

## 2024-01-23 ENCOUNTER — Ambulatory Visit
Admission: RE | Admit: 2024-01-23 | Discharge: 2024-01-23 | Disposition: A | Discharge status: Home / Self Care | Payer: Self-pay | Source: Ambulatory Visit | Attending: Emergency Medicine | Admitting: Emergency Medicine

## 2024-01-23 VITALS — BP 103/77 | HR 68 | Temp 98.4°F | Resp 16

## 2024-01-23 DIAGNOSIS — K047 Periapical abscess without sinus: Secondary | ICD-10-CM

## 2024-01-23 MED ORDER — AMOXICILLIN-POT CLAVULANATE 875-125 MG PO TABS: 1.0000 | ORAL_TABLET | Freq: Two times a day (BID) | ORAL | 0 refills | Status: AC

## 2024-01-23 NOTE — Discharge Instructions (Addendum)
 Take the Augmentin twice daily with food for 7 days for treatment of your dental infection.  Use over-the-counter Tylenol and ibuprofen for swelling and mild to moderate pain.  Rinse with warm salt water, or Listerine, after each meal to remove food particles and wash away any pus that is collecting.  If you develop any increasing or swelling, fever, pain, or difficulty swallowing you to go to the emergency department at [facility] with a have an oral surgeon and also a dentist on-call.

## 2024-01-23 NOTE — ED Triage Notes (Signed)
 Sx x 2 weeks  Left bottom tooth on bottom. Patient states that she has an abscess there and a headache.

## 2024-01-23 NOTE — ED Provider Notes (Signed)
 [facility]    CSN: [id number] Arrival date & time: 01/23/24  1414      History   Chief Complaint Chief Complaint  Patient presents with   Melissa Coffey    I have a Melissa Coffey in my tooth and it is causing a lot of pain - Entered by patient   Dental Pain    HPI Melissa Coffey is a 28 y.o. female.   HPI  28 year old female with past medical history significant for anemia, nicotine dependence, endometriosis, depression, pyelonephritis, and sepsis presents for evaluation of pain in her left lower third molar.  She reports that the pains been going on for last 2 weeks.  She has been to see her dentist and had an Melissa Coffey confirmed by [employer].  She reports that she is awaiting an appointment with her dentist for extraction.  She denies fever or drainage.  Past Medical History:  Diagnosis Date   Anemia    Depression    Endometriosis    History of blood transfusion 2017   after placental abruption   Pyelonephritis 05/2018   Sepsis (HCC) 05/2018   associated with pyelo   UTI (urinary tract infection)     Patient Active Problem List   Diagnosis Date Noted   Nicotine dependence 04/09/2022   History of cesarean delivery 08/19/2019   Encounter for sterilization 08/19/2019   Supervision of other normal pregnancy, antepartum 01/03/2019   Sepsis (HCC) 06/22/2018   Pyelonephritis 05/2018   Intussusception intestine (HCC) 05/2018   Compulsive skin picking 11/30/2017   Anxiety and depression 11/29/2017   Previous cesarean delivery, antepartum condition or complication 05/09/2016   Status post cesarean delivery 05/09/2016   Anemia in pregnancy 11/11/2015    Past Surgical History:  Procedure Laterality Date   ABDOMINAL SURGERY     edemetriosis  Exploratory lap   CESAREAN SECTION  02/2014   pLTCS. FITL at term   CESAREAN SECTION N/A 05/09/2016   Procedure: CESAREAN SECTION;  Surgeon: Conard Novak, MD;  Location: [facility];  Service: Obstetrics;  Laterality: N/A;    CESAREAN SECTION WITH BILATERAL TUBAL LIGATION N/A 08/19/2019   Procedure: CESAREAN SECTION WITH BILATERAL TUBAL LIGATION;  Surgeon: Conard Novak, MD;  Location: [facility];  Service: Obstetrics;  Laterality: N/A;   NECK SURGERY  2003   TONSILLECTOMY     XI ROBOTIC ASSISTED SMALL BOWEL RESECTION N/A 04/09/2022   Procedure: XI ROBOTIC ASSISTED SMALL BOWEL RESECTION;  Surgeon: Leafy Ro, MD;  Location: [facility];  Service: General;  Laterality: N/A;    OB History     Gravida  4   Para  3   Term  2   Preterm  1   AB  1   Living  3      SAB  1   IAB      Ectopic      Multiple  0   Live Births  3        Obstetric Comments  02/2014: pLTCS for FITL          Home Medications    Prior to Admission medications   Medication Sig Start Date End Date Taking? Authorizing Provider  amoxicillin-clavulanate (AUGMENTIN) 875-125 MG tablet Take 1 tablet by mouth every 12 (twelve) hours for 7 days. 01/23/24 01/30/24 Yes Becky Augusta, NP    Family History Family History  Problem Relation Age of Onset   Diabetes Maternal Grandmother    Hypertension Maternal Grandmother    Cancer Maternal Grandmother  Breast cancer Maternal Grandmother    Diabetes Maternal Uncle    Healthy Mother    Healthy Brother    Healthy Daughter    Healthy Son    Healthy Brother    Stroke Neg Hx    Heart attack Neg Hx    Colon cancer Neg Hx    Ovarian cancer Neg Hx     Social History Social History   Tobacco Use   Smoking status: Former    Current packs/day: 0.00    Average packs/day: 1 pack/day for 0.5 years (0.5 ttl pk-yrs)    Types: Cigarettes    Start date: 06/03/2018    Quit date: 12/03/2018    Years since quitting: 5.1   Smokeless tobacco: Current   Tobacco comments:    quit w/+pregnancy test  Vaping Use   Vaping status: Every Day  Substance Use Topics   Alcohol use: No   Drug use: Not Currently     Allergies   Patient has no known allergies.   Review of  Systems Review of Systems  Constitutional:  Negative for fever.  HENT:  Positive for dental problem.      Physical Exam Triage Vital Signs ED Triage Vitals [01/23/24 1426]  Encounter Vitals Group     BP      Systolic BP Percentile      Diastolic BP Percentile      Pulse      Resp      Temp      Temp src      SpO2      Weight      Height      Head Circumference      Peak Flow      Pain Score 8     Pain Loc      Pain Education      Exclude from Growth Chart    No data found.  Updated Vital Signs BP 103/77 (BP Location: Left Arm)   Pulse 68   Temp 98.4 F (36.9 C) (Oral)   Resp 16   LMP 01/15/2024 (Approximate)   SpO2 99%   Visual Acuity Right Eye Distance:   Left Eye Distance:   Bilateral Distance:    Right Eye Near:   Left Eye Near:    Bilateral Near:     Physical Exam Vitals and nursing note reviewed.  Constitutional:      Appearance: Normal appearance. She is not ill-appearing.  HENT:     Head: Normocephalic and atraumatic.     Mouth/Throat:     Mouth: Mucous membranes are moist.     Pharynx: Oropharynx is clear. Posterior oropharyngeal erythema present. No oropharyngeal exudate.     Comments: Left lower third molar is decayed with some surrounding erythema to the gum tissue.  There is also a palpable area of swelling on the external aspect of the mandible that corresponds to this tooth location. Skin:    General: Skin is warm and dry.     Findings: No rash.  Neurological:     Mental Status: She is alert.      UC Treatments / Results  Labs (all labs ordered are listed, but only abnormal results are displayed) Labs Reviewed - No data to display  EKG   Radiology No results found.  Procedures Procedures (including critical care time)  Medications Ordered in UC Medications - No data to display  Initial Impression / Assessment and Plan / UC Course  I have reviewed the triage vital  signs and the nursing notes.  Pertinent labs & imaging  results that were available during my care of the patient were reviewed by me and considered in my medical decision making (see chart for details).   Patient is a pleasant, nontoxic-appearing 28 year old female presenting for evaluation of dental Melissa Coffey as outlined HPI above.  She has a confirmed Melissa Coffey 5 [employer] from her dentist and she is awaiting an appointment for an extraction.  She denies any fever or drainage.  She does have a decayed left lower third molar with some surrounding erythema to the gum tissue but no appreciable exudate.  There is a palpable area of swelling external on the mandible that corresponds to the location of the third molar.  I will discharge home with a diagnosis of dental Melissa Coffey and started on Augmentin 875 twice daily for 7 days.  I have advised her to call her dentist office and make an appointment for follow-up.   Final Clinical Impressions(s) / UC Diagnoses   Final diagnoses:  Dental Melissa Coffey     Discharge Instructions      Take the Augmentin twice daily with food for 7 days for treatment of your dental infection.  Use over-the-counter Tylenol and ibuprofen for swelling and mild to moderate pain.  Rinse with warm salt water, or Listerine, after each meal to remove food particles and wash away any pus that is collecting.  If you develop any increasing or swelling, fever, pain, or difficulty swallowing you to go to the emergency department at [facility] with a have an oral surgeon and also a dentist on-call.      ED Prescriptions     Medication Sig Dispense Auth. Provider   amoxicillin-clavulanate (AUGMENTIN) 875-125 MG tablet Take 1 tablet by mouth every 12 (twelve) hours for 7 days. 14 tablet Becky Augusta, NP      PDMP not reviewed this encounter.   Becky Augusta, NP 01/23/24 [phone]

## 2024-11-02 ENCOUNTER — Encounter: Payer: Self-pay | Admitting: Oncology

## 2024-12-18 ENCOUNTER — Ambulatory Visit: Admitting: Physician Assistant
